# Patient Record
Sex: Female | Born: 1950 | Race: White | Hispanic: No | Smoking: Former smoker
Health system: Southern US, Community
[De-identification: ages and names within clinical notes are randomized; demographics above are authoritative.]

## PROBLEM LIST (undated history)

## (undated) DIAGNOSIS — E785 Hyperlipidemia, unspecified: Secondary | ICD-10-CM

## (undated) DIAGNOSIS — F32A Depression, unspecified: Secondary | ICD-10-CM

## (undated) DIAGNOSIS — B029 Zoster without complications: Secondary | ICD-10-CM

## (undated) DIAGNOSIS — G43909 Migraine, unspecified, not intractable, without status migrainosus: Secondary | ICD-10-CM

## (undated) DIAGNOSIS — R000 Tachycardia, unspecified: Secondary | ICD-10-CM

## (undated) DIAGNOSIS — R51 Headache: Secondary | ICD-10-CM

## (undated) DIAGNOSIS — J189 Pneumonia, unspecified organism: Secondary | ICD-10-CM

## (undated) DIAGNOSIS — M797 Fibromyalgia: Secondary | ICD-10-CM

## (undated) DIAGNOSIS — M1990 Unspecified osteoarthritis, unspecified site: Secondary | ICD-10-CM

## (undated) DIAGNOSIS — C801 Malignant (primary) neoplasm, unspecified: Secondary | ICD-10-CM

## (undated) DIAGNOSIS — K219 Gastro-esophageal reflux disease without esophagitis: Secondary | ICD-10-CM

## (undated) DIAGNOSIS — T7840XA Allergy, unspecified, initial encounter: Secondary | ICD-10-CM

## (undated) DIAGNOSIS — I10 Essential (primary) hypertension: Secondary | ICD-10-CM

## (undated) DIAGNOSIS — E079 Disorder of thyroid, unspecified: Secondary | ICD-10-CM

## (undated) DIAGNOSIS — F329 Major depressive disorder, single episode, unspecified: Secondary | ICD-10-CM

## (undated) DIAGNOSIS — G8929 Other chronic pain: Secondary | ICD-10-CM

## (undated) DIAGNOSIS — E669 Obesity, unspecified: Secondary | ICD-10-CM

## (undated) DIAGNOSIS — C4499 Other specified malignant neoplasm of skin, unspecified: Secondary | ICD-10-CM

## (undated) HISTORY — PX: ABDOMINAL EXPLORATION SURGERY: SHX538

## (undated) HISTORY — DX: Major depressive disorder, single episode, unspecified: F32.9

## (undated) HISTORY — DX: Allergy, unspecified, initial encounter: T78.40XA

## (undated) HISTORY — DX: Tachycardia, unspecified: R00.0

## (undated) HISTORY — DX: Essential (primary) hypertension: I10

## (undated) HISTORY — DX: Fibromyalgia: M79.7

## (undated) HISTORY — DX: Depression, unspecified: F32.A

## (undated) HISTORY — DX: Hyperlipidemia, unspecified: E78.5

## (undated) HISTORY — DX: Zoster without complications: B02.9

## (undated) HISTORY — PX: BUNIONECTOMY: SHX129

## (undated) HISTORY — PX: TONSILLECTOMY: SUR1361

## (undated) HISTORY — DX: Other specified malignant neoplasm of skin, unspecified: C44.99

## (undated) HISTORY — DX: Other chronic pain: G89.29

## (undated) HISTORY — DX: Migraine, unspecified, not intractable, without status migrainosus: G43.909

## (undated) HISTORY — DX: Headache: R51

## (undated) HISTORY — DX: Pneumonia, unspecified organism: J18.9

## (undated) HISTORY — DX: Malignant (primary) neoplasm, unspecified: C80.1

## (undated) HISTORY — DX: Gastro-esophageal reflux disease without esophagitis: K21.9

## (undated) HISTORY — DX: Unspecified osteoarthritis, unspecified site: M19.90

## (undated) HISTORY — DX: Disorder of thyroid, unspecified: E07.9

## (undated) HISTORY — DX: Obesity, unspecified: E66.9

---

## 1997-10-03 ENCOUNTER — Ambulatory Visit (HOSPITAL_COMMUNITY): Admission: RE | Admit: 1997-10-03 | Discharge: 1997-10-03 | Payer: Self-pay | Admitting: Neurology

## 1998-04-13 HISTORY — PX: CERVICAL LAMINECTOMY: SHX94

## 1998-05-06 ENCOUNTER — Other Ambulatory Visit: Admission: RE | Admit: 1998-05-06 | Discharge: 1998-05-06 | Payer: Self-pay | Admitting: *Deleted

## 1998-07-05 ENCOUNTER — Encounter: Payer: Self-pay | Admitting: Neurology

## 1998-07-05 ENCOUNTER — Ambulatory Visit (HOSPITAL_COMMUNITY): Admission: RE | Admit: 1998-07-05 | Discharge: 1998-07-05 | Payer: Self-pay | Admitting: Neurology

## 1999-05-09 ENCOUNTER — Other Ambulatory Visit: Admission: RE | Admit: 1999-05-09 | Discharge: 1999-05-09 | Payer: Self-pay | Admitting: *Deleted

## 1999-08-28 ENCOUNTER — Encounter: Payer: Self-pay | Admitting: Emergency Medicine

## 1999-08-28 ENCOUNTER — Emergency Department (HOSPITAL_COMMUNITY): Admission: EM | Admit: 1999-08-28 | Discharge: 1999-08-28 | Payer: Self-pay | Admitting: Emergency Medicine

## 1999-09-04 ENCOUNTER — Encounter: Payer: Self-pay | Admitting: Neurosurgery

## 1999-09-04 ENCOUNTER — Ambulatory Visit (HOSPITAL_COMMUNITY): Admission: RE | Admit: 1999-09-04 | Discharge: 1999-09-04 | Payer: Self-pay | Admitting: Neurosurgery

## 1999-10-08 ENCOUNTER — Encounter: Payer: Self-pay | Admitting: Neurosurgery

## 1999-10-10 ENCOUNTER — Inpatient Hospital Stay (HOSPITAL_COMMUNITY): Admission: RE | Admit: 1999-10-10 | Discharge: 1999-10-12 | Payer: Self-pay | Admitting: Neurosurgery

## 1999-10-10 ENCOUNTER — Encounter: Payer: Self-pay | Admitting: Neurosurgery

## 1999-10-20 ENCOUNTER — Emergency Department (HOSPITAL_COMMUNITY): Admission: EM | Admit: 1999-10-20 | Discharge: 1999-10-20 | Payer: Self-pay | Admitting: Emergency Medicine

## 1999-11-04 ENCOUNTER — Encounter: Payer: Self-pay | Admitting: Neurosurgery

## 1999-11-04 ENCOUNTER — Encounter: Admission: RE | Admit: 1999-11-04 | Discharge: 1999-11-04 | Payer: Self-pay | Admitting: Neurosurgery

## 2001-01-10 ENCOUNTER — Ambulatory Visit (HOSPITAL_COMMUNITY): Admission: RE | Admit: 2001-01-10 | Discharge: 2001-01-10 | Payer: Self-pay | Admitting: Internal Medicine

## 2001-01-10 ENCOUNTER — Encounter: Payer: Self-pay | Admitting: Internal Medicine

## 2001-11-14 ENCOUNTER — Other Ambulatory Visit: Admission: RE | Admit: 2001-11-14 | Discharge: 2001-11-14 | Payer: Self-pay | Admitting: *Deleted

## 2002-10-24 ENCOUNTER — Other Ambulatory Visit: Admission: RE | Admit: 2002-10-24 | Discharge: 2002-10-24 | Payer: Self-pay | Admitting: *Deleted

## 2002-12-05 ENCOUNTER — Encounter: Payer: Self-pay | Admitting: *Deleted

## 2002-12-05 ENCOUNTER — Encounter: Admission: RE | Admit: 2002-12-05 | Discharge: 2002-12-05 | Payer: Self-pay | Admitting: *Deleted

## 2003-08-08 ENCOUNTER — Ambulatory Visit (HOSPITAL_COMMUNITY): Admission: RE | Admit: 2003-08-08 | Discharge: 2003-08-08 | Payer: Self-pay | Admitting: Orthopedic Surgery

## 2003-11-26 ENCOUNTER — Ambulatory Visit (HOSPITAL_BASED_OUTPATIENT_CLINIC_OR_DEPARTMENT_OTHER): Admission: RE | Admit: 2003-11-26 | Discharge: 2003-11-26 | Payer: Self-pay | Admitting: Internal Medicine

## 2004-01-25 ENCOUNTER — Ambulatory Visit (HOSPITAL_COMMUNITY): Admission: RE | Admit: 2004-01-25 | Discharge: 2004-01-25 | Payer: Self-pay | Admitting: Family Medicine

## 2004-03-05 ENCOUNTER — Ambulatory Visit (HOSPITAL_COMMUNITY): Admission: RE | Admit: 2004-03-05 | Discharge: 2004-03-05 | Payer: Self-pay | Admitting: Neurosurgery

## 2004-05-06 ENCOUNTER — Ambulatory Visit: Payer: Self-pay | Admitting: Internal Medicine

## 2004-07-22 ENCOUNTER — Ambulatory Visit: Payer: Self-pay | Admitting: Internal Medicine

## 2004-09-22 ENCOUNTER — Ambulatory Visit: Payer: Self-pay | Admitting: Family Medicine

## 2004-11-20 ENCOUNTER — Ambulatory Visit: Payer: Self-pay | Admitting: Internal Medicine

## 2004-12-19 ENCOUNTER — Ambulatory Visit: Payer: Self-pay | Admitting: Internal Medicine

## 2005-02-23 ENCOUNTER — Ambulatory Visit: Payer: Self-pay | Admitting: Internal Medicine

## 2006-06-29 ENCOUNTER — Encounter: Admission: RE | Admit: 2006-06-29 | Discharge: 2006-06-29 | Payer: Self-pay | Admitting: Family Medicine

## 2006-09-22 ENCOUNTER — Ambulatory Visit: Payer: Self-pay | Admitting: Gastroenterology

## 2006-09-28 ENCOUNTER — Encounter: Payer: Self-pay | Admitting: Gastroenterology

## 2006-09-28 ENCOUNTER — Ambulatory Visit: Payer: Self-pay | Admitting: Gastroenterology

## 2006-09-30 ENCOUNTER — Encounter: Admission: RE | Admit: 2006-09-30 | Discharge: 2006-09-30 | Payer: Self-pay | Admitting: Family Medicine

## 2007-01-14 DIAGNOSIS — R519 Headache, unspecified: Secondary | ICD-10-CM | POA: Insufficient documentation

## 2007-01-14 DIAGNOSIS — K219 Gastro-esophageal reflux disease without esophagitis: Secondary | ICD-10-CM | POA: Insufficient documentation

## 2007-01-14 DIAGNOSIS — R51 Headache: Secondary | ICD-10-CM | POA: Insufficient documentation

## 2007-01-14 DIAGNOSIS — I10 Essential (primary) hypertension: Secondary | ICD-10-CM | POA: Insufficient documentation

## 2007-01-14 DIAGNOSIS — E119 Type 2 diabetes mellitus without complications: Secondary | ICD-10-CM | POA: Insufficient documentation

## 2007-02-15 ENCOUNTER — Inpatient Hospital Stay (HOSPITAL_COMMUNITY): Admission: EM | Admit: 2007-02-15 | Discharge: 2007-02-18 | Payer: Self-pay | Admitting: Emergency Medicine

## 2007-02-17 ENCOUNTER — Ambulatory Visit: Payer: Self-pay | Admitting: Infectious Diseases

## 2007-04-14 HISTORY — PX: ROUX-EN-Y PROCEDURE: SUR1287

## 2007-04-27 ENCOUNTER — Ambulatory Visit: Payer: Self-pay | Admitting: Cardiology

## 2007-05-04 ENCOUNTER — Encounter: Payer: Self-pay | Admitting: Cardiology

## 2007-05-04 ENCOUNTER — Ambulatory Visit: Payer: Self-pay

## 2007-08-11 ENCOUNTER — Encounter: Admission: RE | Admit: 2007-08-11 | Discharge: 2007-08-11 | Payer: Self-pay | Admitting: Family Medicine

## 2007-08-27 DIAGNOSIS — E669 Obesity, unspecified: Secondary | ICD-10-CM | POA: Insufficient documentation

## 2007-08-27 DIAGNOSIS — D126 Benign neoplasm of colon, unspecified: Secondary | ICD-10-CM | POA: Insufficient documentation

## 2007-08-27 DIAGNOSIS — M129 Arthropathy, unspecified: Secondary | ICD-10-CM | POA: Insufficient documentation

## 2007-08-27 DIAGNOSIS — E039 Hypothyroidism, unspecified: Secondary | ICD-10-CM | POA: Insufficient documentation

## 2007-08-27 DIAGNOSIS — K2960 Other gastritis without bleeding: Secondary | ICD-10-CM | POA: Insufficient documentation

## 2007-10-30 ENCOUNTER — Emergency Department (HOSPITAL_BASED_OUTPATIENT_CLINIC_OR_DEPARTMENT_OTHER): Admission: EM | Admit: 2007-10-30 | Discharge: 2007-10-30 | Payer: Self-pay | Admitting: Emergency Medicine

## 2008-03-13 ENCOUNTER — Encounter: Payer: Self-pay | Admitting: Cardiology

## 2008-04-23 ENCOUNTER — Encounter: Admission: RE | Admit: 2008-04-23 | Discharge: 2008-04-23 | Payer: Self-pay | Admitting: Family Medicine

## 2008-07-12 ENCOUNTER — Encounter: Admission: RE | Admit: 2008-07-12 | Discharge: 2008-07-12 | Payer: Self-pay | Admitting: Neurosurgery

## 2009-09-24 ENCOUNTER — Encounter: Admission: RE | Admit: 2009-09-24 | Discharge: 2009-09-24 | Payer: Self-pay | Admitting: Family Medicine

## 2010-01-04 ENCOUNTER — Encounter: Admission: RE | Admit: 2010-01-04 | Discharge: 2010-01-04 | Payer: Self-pay | Admitting: Orthopedic Surgery

## 2010-01-11 HISTORY — PX: ROTATOR CUFF REPAIR: SHX139

## 2010-06-10 ENCOUNTER — Other Ambulatory Visit: Payer: Self-pay | Admitting: Orthopedic Surgery

## 2010-06-10 DIAGNOSIS — M25519 Pain in unspecified shoulder: Secondary | ICD-10-CM

## 2010-06-12 ENCOUNTER — Ambulatory Visit
Admission: RE | Admit: 2010-06-12 | Discharge: 2010-06-12 | Disposition: A | Discharge status: Home / Self Care | Payer: BC Managed Care – PPO | Source: Ambulatory Visit | Attending: Orthopedic Surgery | Admitting: Orthopedic Surgery

## 2010-06-12 DIAGNOSIS — M25519 Pain in unspecified shoulder: Secondary | ICD-10-CM

## 2010-08-26 NOTE — Assessment & Plan Note (Signed)
[facility]                         GASTROENTEROLOGY OFFICE NOTE   Rhonda Farley, Rhonda Farley                      MRN:          [id number]  DATE:09/22/2006                            DOB:          11/08/1950    REASON FOR REFERAL:  Family history of colon cancer and recurrent nausea  and vomiting.   REFERRING MD:  Ursula Beath, MD   HISTORY OF PRESENT ILLNESS:  Rhonda Farley is a 60 year old white female  that I have previously evaluated for GERD and gastritis. She underwent  upper endoscopy in February of 2004. Mild erosive  antral gastritis was  noted. RUT was negative. Mild distal esophageal erythema was also noted.  She has been maintained on Nexium b.i.d. since that time. In addition,  she underwent colonoscopy in April of 1998 for chronic right lower  quadrant pain and intermittent diarrhea. The colonoscopy was normal to  the terminal ileum. Her twin sister has Crohn's  disease. Rhonda Farley  has noted about an 8 month history of intermittent nausea and vomiting  that is sudden and unexpected. It has not been associated with  refractory reflux symptoms, regurgitation, dysphagia, odynophagia, or  any change in bowel function. She has chronic right lower quadrant pain  that has been mild and present for over 15 years with no clear cause.  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 A biopsy was taken and she returns for a deep and wider  resection to Dr. Graylon Gunning next week. 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 Her mother has a  history of colon cancer that developed at about age 78 and she has a  maternal aunt with esophageal cancer. The patient has no complaints of  diarrhea, constipation, change in stool caliber, melena, hematochezia,  or weight loss.   PAST MEDICAL HISTORY:  Hypertension, diabetes mellitus, hyperlipidemia,  hypothyroidism,  arthritis, headaches, GERD, status post bunionectomy,  status post tonsillectomy, seasonal allergies, history of shingles,  history of pneumonia, restless leg syndrome, fibromyalgia, bilateral  knee arthritis, obesity, status post abdominal laparoscopy, status post,  cervical laminectomy.   CURRENT MEDICATIONS:  Listed on the chart.   MEDICATION ALLERGIES:  IV CONTRAST DYE LEADING TO A RASH.   Social history and review of systems per the handwritten form.   PHYSICAL EXAMINATION:  Obese, anxious white female. Height 5 feet 4 and  a half inches, blood pressure is 120/86, pulse 100 and regular.  HEENT EXAM: Anicteric sclera, oropharynx clear.  NECK: Without adenopathy appreciated.  CHEST: Clear to auscultation bilaterally.  CARDIAC: Regular rate and rhythm without murmurs.  ABDOMEN: Soft, mild right lower quadrant tenderness to deep palpation.  No rebound or guarding. No palpable organomegaly, masses, or hernias.  Normoactive bowel sounds.  RECTAL EXAM: Deferred to time of colonoscopy.  SKIN: A focused skin exam of her right upper extremity shows the recent  biopsy site.  EXTREMITIES: Without clubbing, cyanosis, or edema.  NEUROLOGIC: Alert and oriented x3. Grossly nonfocal.   ASSESSMENT/PLAN:  1. Recurrent nausea and vomiting. Etiology unclear.  Possible reflux      and regurgitation. Rule out medication side effects and other      causes. Re-intensify all antireflux measures and continue Nexium at      40 mg p.o. b.i.d. taken 30 to 45 minutes before breakfast and      dinner. Risks, benefits, and alternatives to upper endoscopy with      possible biopsy discussed with the patient. She consents to      proceed. This will be scheduled electively.  2. Rule out Muir-Torre syndrome. This is a subtype of nonpolyposis      colorectal cancer syndrome and is associated with sebaceous      adenocarcinomas. It is also associated with other malignancies      including, breast, uterine, and  renal tumors. Her primary      physician, Dr. Raquel James plans to schedule a CT scan of the abdomen      and pelvis. Risk, benefits, and alternatives to colonoscopy with      possible biopsy, and possible polypectomy discussed with the      patient. She consents to proceed. This will be scheduled at the      time of her colonoscopy.     Rhonda Farley. Russella Dar, MD, [facility]  Electronically Signed    MTS/MedQ  DD: 09/22/2006  DT: 09/22/2006  Job #: [id number]   cc:   Ursula Beath, MD

## 2010-08-26 NOTE — Letter (Signed)
April 27, 2007    Dr. Adele Dan  [facility] at [facility]  [id number]  [place], [place] [address]   RE:  Rhonda Farley, Rhonda Farley  MRN:  [id number]  /  DOB:  [birth year]   Dear Dr. Reinaldo Raddle:   Thank you for your referral of Rhonda Farley for preoperative evaluation.  As you know she is a pleasant 60 year old woman with a history of  obesity, hypertension on medical therapy, and type 2 diabetes mellitus  treated medically over the last year.  Additional problems are outlined  below.  She is being considered for bariatric surgery which is actually  already scheduled on the 28th of this month.  She reports no prior major  cardiac conditions, although has not undergone any previous ischemic  evaluation.  She does state that she took phen-fen for approximately one  month several years ago, but has no known history of valvular heart  disease.  Symptomatically she denies any exertional chest pain,  palpitations or syncope.  She has no dyspnea on exertion beyond NYHA  class I to II.  She is not exercising regularly at this time stating  that she just started back walking approximately one half mile at a  time.  Back in September of last year she was apparently exercising  regularly with a trainer and on a diet at that point.  She reports that  her weight has been 230 pounds for at least the last two years, and that  she has had a very difficult time losing any significant degree of  weight.  Her electrocardiogram today in clinic is normal showing sinus  rhythm at 95 beats per minute.   ALLERGIES:  The patient does report rash with IVP DYE and also has an  allergy to WATERMELON.  No drug allergies are listed.   MEDICATIONS:  She is presently taking Januvia 100 mg p.o. daily,  glipizide 10 mg p.o. b.i.d. Cymbalta 60 mg p.o. daily, Nexium 40 mg p.o.  b.i.d., levothyroxine 0.25 mg p.o. daily, ropinirole 1 mg p.o. q.h.s.,  Valtrex 500 mg p.o. daily, Cozaar  50 mg p.o. daily, Zyrtec 10 mg p.o.  daily, multivitamin daily, B complex vitamins, calcium supplements,  Fiorinal caplets, propoxyphene, zolpidem 5 mg p.o. p.r.n., Mucinex  p.r.n., Lyrica 75 mg p.o. t.i.d., Amerge 0.5 mg p.o. p.r.n., naproxen  p.r.n., and Motrin p.r.n.   PAST MEDICAL HISTORY:  Past medical history is as outlined above.  The  patient reports additional problems including bilateral knee arthritis,  fibromyalgia, restless leg syndrome, bronchiolitis obliterans with  organizing pneumonia diagnosed in 2003, tendinitis, gastroesophageal  reflux disease, goiter, seasonal allergies, herpes zoster, and migraine  headaches.  She has undergone a tonsillectomy, bunionectomy, and  cervical laminectomy.   FAMILY HISTORY:  The patient reports no premature cardiovascular  disease.  Her father died at age 39 with non-small cell lung cancer, and  her mother had colon cancer in her 2's.   Additional Family History was reviewed and is noncontributory at this  point.   REVIEW OF SYSTEMS:  Is as described above.  She does have some shortness  of breath with seasonal allergies but no active wheezing on any regular  basis and no need for metered-dose inhaler therapy on any regular basis.  She has history of polycystic ovary disease and some menstrual  dysfunction.  No orthopnea, PND.  Mild lower extremity edema that is  dependent.  Otherwise, systems are negative.   Ms.  Farley is married.  She has one child.  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  She does have a prior tobacco use history, but  quit in 1985.  No active alcohol use.   PHYSICAL EXAMINATION:  VITAL SIGNS:  The patient weighs 230 pounds,  blood pressure 134/91, heart rate 90.  GENERAL:  An obese woman in no acute distress.  HEENT:  Conjunctivae, lids normal.  Oropharynx clear.  NECK:  Supple.  No elevated jugular venous pressure, no bruits or  thyromegaly noted.  LUNGS:  Generally clear.  Nonlabored breathing.   No wheezing noted.  CARDIAC:  Somewhat diminished heart sounds but normal S1 and S2.  No  loud systolic murmur.  No S3 gallop.  No pericardial rub.  ABDOMEN:  Soft, nontender, normoactive bowels sounds.  No bruits.  EXTREMITIES:  Exhibit trace edema around the ankles.  Otherwise,  nonpitting.  Distal pulses 2+.  SKIN:  Warm and dry.  MUSCULOSKELETAL:  No kyphosis is noted.  NEUROPSYCHIATRIC:  The patient is alert and oriented x3.  Affect is  normal.   IMPRESSION/RECOMMENDATIONS:  Rhonda Farley is a 60 year old woman with  history of obesity, hypertension, and type 2 diabetes mellitus.  She is  being considered for bariatric surgery on the 28th of this month at [facility].  Her resting electrocardiogram is normal.  She does have a  history of phen-fen use for one month period of time several years ago.  Preoperative assessment is requested.  Our plan will be a 2-D  echocardiogram to assess  cardiac structure and valvular function and also an adenosine Myoview  for basic assessment of ischemia.  If the studies are low risk I would  anticipate that she should be able to proceed on with her planned  surgery.  Will plan to inform the patient of the test results and also  forward these on to you.    Sincerely,      Jonelle Sidle, MD  Electronically Signed    SGM/MedQ  DD: 04/27/2007  DT: 04/27/2007  Job #: [id number]

## 2010-08-26 NOTE — Discharge Summary (Signed)
Farley, Rhonda               ACCOUNT NO.:  [id number]   MEDICAL RECORD NO.:  [id number]          PATIENT TYPE:  INP   LOCATION:  4728                         FACILITY:  [facility]   PHYSICIAN:  Lucita Ferrara, MD         DATE OF BIRTH:  07-Feb-1951   DATE OF ADMISSION:  02/15/2007  DATE OF DISCHARGE:  02/18/2007                               DISCHARGE SUMMARY   ADMISSION DIAGNOSIS:  1. Headaches with altered mental status.  2. Confusion.  3. Migraine headache.  4. Rule out meningeal encephalitis.  5. Diabetes type 2.  6. Hypertension.  7. Hyperlipidemia.  8. Fibromyalgia with restless legs syndrome.  9. History of ocular HSV.  10.History of Tourette syndrome.   DISCHARGE DIAGNOSES:  1. Headaches with altered mental status- resolved.  2. Confusion- resolved.  3. Migraine headache, stable, chronic and resolved.  4. Rule out meningeal encephalitis. Ruled-out.  5. Diabetes type 2, stable and chronic.  6. Hypertension.  7. Hyperlipidemia.  8. Fibromyalgia with restless legs syndrome.  9. History of ocular HSV.  10.History of Tourette syndrome.  11.Encephalitis, ruled out.  12.Meningitis, ruled out.   DISCHARGE CONDITION:  Stable.   MENTAL STATUS:  Stable.  The patient able to ambulate.  Good p.o.  intake, without nausea or vomiting.  Ambulation without dizziness or  falls.   CONSULTATIONS:  Dr. Pearlean Brownie from neurology was consulted.   PROCEDURES:  Patient had a lumbar puncture which showed negative results  for bacteria.  Patient also had  an MRI of the brain which was negative.  This was done on February 16, 2007, which was negative.  MRA of the brain  and angiography were also negative.  CT scan February 15, 2007 was also  negative.  It only showed acute-on-chronic postnasal sinusitis.  Patient  also had a fluoroscopic lumbar puncture which was an unremarkable  procedure, but the results were as follows:  No organisms seen.  Final  February 16, 2007 the cell count and  differential are as follows:  Color  was colorless.  Clarity was clear.  White count 2, within normal limits,  white blood cell count 1 (given traumatic puncture this was okay),  lymphocytes few, which was negative.  Monocytes occasional, which was  also negative.  Patient also had a cardiac panel within normal limits  and negative.  Thyroid function test also was within normal limits.  Hemoglobin A1C showed an uncontrolled 10.1.  CBC:  White count 11.1,  hemoglobin 14.3, hematocrit 42.1, and platelets 242.   BRIEF HISTORY AND PHYSICAL EXAMINATION:  The patient is a 60 year old  Caucasian female who presented to the hospital with confusion and  headaches and disorientation for several days.  Sleepiness as well.  Patient apparently was actually hallucinating, seeing animals and  insects which were not there.  Patient had been also confused on and off  at that time.  For the past three days patient had been suffering from  headaches and migraines.  Most of the history at that time was provided  by the husband.  The patient also had auditory hallucinations.  Patient  reported having double vision at that time.   HOSPITAL COURSE:  Pretty unremarkable, with, as above, the general tests  including CT, MRI of the brain, and lumbar puncture being negative,  patient's headache was well controlled with pain medications.  All  medications were reinstituted.  Home medications,  including Cymbalta,  glipizide, insulin, NovoLog, levothyroxine, Claritin, Cozaar.  For  chronic medical problems, patient's headaches, and generalized symptoms  resolved.   DISCHARGE PHYSICAL EXAMINATION:  VITAL SIGNS:  Temperature 98.  Pulse  85.  Respirations 18.  Blood pressure 111/70.  Pulse ox 94% on room air.  HEENT:  Normocephalic, atraumatic.  NECK:  Supple.  No JVD.  No carotid bruits.  CARDIOVASCULAR:  S1, S2.  Regular rate and rhythm.  No murmurs, rubs or  clicks.  ABDOMEN:  The abdomen is soft and nontender  and nondistended.  Positive  bowel sounds.  LUNGS:  Clear to auscultation bilaterally.  No rhonchi or rales or  wheezes.  NEURO:  Alert and oriented x 2.  Cranial nerves II-XII are grossly  intact.  Cerebellar intact grossly.  Strength is 5/5, bilateral upper  and lower extremities.  Reflexes are 2+ bilaterally, upper and lower  extremities.  Reflexes 2+ bilaterally, upper and lower extremities.  MENTAL STATUS:  The patient is alert and oriented x3.  Denies any visual  or auditory hallucinations.   ACTIVITY:  The patient is able to ambulate without difficulty, and not  dizzy.  Able to walk without falls.  Able to tolerate p.o. intake.   DIET:  The patient to have a regular diet as before.   MEDICATIONS:  To be taken at home are as follows:  1. Patient is recommended to take Depakote per Neurology      recommendations.  2. ER 500 mg daily for two weeks.  3. Patient resume Cymbalta 60 mg p.o. every day.  4. Patient to resume glipizide 10 mg p.o. every day.  5. Patient to resume levothyroxine 125 mcg p.o. every day.  6. Patient to resume Claritin 10 mg p.o. daily.  7. Cozaar 50 mg p.o. every day.  8. Protonix pump inhibitor 40 mg p.o. every day.  9. Requip for restless legs syndrome 1 mg p.o. nightly.  10.Zocor 40 mg p.o. daily.  11.Januvia 50 mg p.o. every day.   FOLLOWUP:  1. Patient recommended to follow up with Neurology for chronic      migraine headaches in two weeks.  2. Follow up with primary care doctor in one to two weeks as well.  3. Patient is discharged from the floor hemodynamically stable.  4. Patient advised to call with severe headaches, migraines, chest      pain, shortness of breath, or recurrent hallucinations.      Lucita Ferrara, MD  Electronically Signed     RR/MEDQ  D:  02/18/2007  T:  02/18/2007  Job:  [id number]   cc:   Ursula Beath, MD

## 2010-08-26 NOTE — H&P (Signed)
NAMETREASURE, [facility] NO.:  [id number]   MEDICAL RECORD NO.:  [id number]          PATIENT TYPE:  INP   LOCATION:  4728                         FACILITY:  [facility]   PHYSICIAN:  Della Goo, M.D. DATE OF BIRTH:  [birth year]   DATE OF ADMISSION:  02/15/2007  DATE OF DISCHARGE:                              HISTORY & PHYSICAL   PRIMARY CARE PHYSICIAN:  Dr. Ursula Beath, [facility].   CHIEF COMPLAINT:  Hallucinations.   HISTORY OF PRESENT ILLNESS:  This is a 60 year old female who was  brought to the emergency department by her husband secondary to  complaints of severe headache, confusion and decreased memory since the  a.m.  For the past 3 days the patient had been suffering a headache and  she does have a history of migraine headaches.  She reports the headache  was mild however did report that at times the headache had been a 10/10.  The patient is unable to give the recent history prior to admission and  this history is provided by her husband and her friend who are at  bedside.  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  He reports that she had confusion and was disoriented and her friend  also reports that she had decreased memory and had lost track of days.  The friend also states that the patient was making up words.  The  patient also was reported by both as having visual hallucinations and  report that in the a.m. the patient wrapped a towel around her bathroom  commode.  Her husband asked her why she had done this and she stated  that she saw a pig vomiting into the commode.  While the patient was in  the emergency department room as well, she describes seeing a toy  soldier on the wall which was not there.  The patient's husband and  friend also report that the patient has  had double vision.  They also  report that on arrival to the hospital the patient symptoms began to  improve.  The patient denies having any fevers, chills, cough, shortness  of breath.  She does report having upper respiratory infection symptoms  2 weeks ago.  The patient's husband reports checking her blood sugar and  finding her blood sugar to be 191.   PAST MEDICAL HISTORY:  Significant for type 2 diabetes mellitus,  fibromyalgia, hypertension, hypothyroidism, migraine headaches, restless  leg syndrome, Muir-Torret syndrome, and ocular herpes simplex.   PAST SURGICAL HISTORY:  History of a cervical spine fusion C3, C4, C5  and C-6.  Sebaceous cyst removal of the right upper arm and right  bunionectomy.   MEDICATIONS:  1. Levothyroxine 125 mcg one p.o. daily.  2. Glyburide 10 mg one p.o. daily.  3. Januvia 50 mg one p.o. daily.  4. Zocor 40 mg one p.o. daily.  5. Cymbalta 60 mg one p.o. daily.  6. Cozaar 50  mg one p.o. daily.  7. Valtrex 500 mg one p.o. daily.  8. Zyrtec 10 mg one p.o. daily.  9. Requip 1 mg p.o. nightly.  10.Darvocet N 100 2 tablets p.o. q.6 h p.r.n. severe pain.  11.Fiorinal p.r.n. migraine headaches.   SOCIAL HISTORY:  The patient is married, nonsmoker, nondrinker.   FAMILY HISTORY:  Positive for coronary artery disease and hypertension  in her paternal grandmother.  Positive diabetes mellitus in paternal  uncles.  Mother with skin cancer and father had prostate cancer.  Sister  with Crohn's disease and positive family history of colon and esophageal  cancers.   REVIEW OF SYSTEMS:  Pertinent's are mentioned above.   PHYSICAL EXAMINATION:  GENERAL:  This is a 60 year old obese female in  discomfort but no acute distress.  VITAL SIGNS:  Temperature 98.2, blood pressure 145/89, heart rate 106,  respirations 22, O2 saturations 97%.  HEENT: Examination normocephalic, atraumatic.  Pupils equally round  reactive to light.  Extraocular muscles are intact.   Funduscopic benign.  Oropharynx is clear.  NECK:  Neck is supple, full range of motion.  No thyromegaly,  adenopathy, jugular venous distention.  CARDIOVASCULAR:  Regular rate and rhythm.  No murmurs, gallops or rubs.  LUNGS:  Clear to auscultation bilaterally.  ABDOMEN:  Positive bowel sounds, soft, nontender, nondistended.  EXTREMITIES:  Without cyanosis, clubbing or edema.  NEUROLOGIC EXAMINATION:  The patient is alert and oriented x3.  Her  speech is clear.  She does have mild confusion and decreased  recollection of recent events.  Her cranial nerves are intact.  Motor  and sensory function are intact.  Her strength is 5/5 and she has full  range of motion throughout.  Babinski sign:  Toes are downgoing  bilaterally. Deep tendon reflexes are intact.   CT scan of the head performed reveals negative acute intracranial  hemorrhage or masses.   ASSESSMENT:  A 60 year old female being admitted with:  1. Altered mental status.  2. Type 2 diabetes mellitus.  3. Hypertension.  4. Hyperlipidemia.  5. Chronic bilateral hip pain.  6. Fibromyalgia and restless leg syndrome.  7. History of ocular HSV.  8. History of Muir-Torret syndrome.   PLAN:  The patient will be admitted to a neurology and telemetry area.  Neurologic checks will be performed.  An MRI and MRA study has been  ordered along with an EEG study.  A lumbar puncture has also been  ordered per interventional radiology.  The patient will be placed on  empiric antibiotic therapy of Rocephin 2 grams IV times one now then q.  day and then antiviral therapy with IV acyclovir.  The patient will  continue on her regular medications.  GI and DVT prophylaxis have also been ordered.  The patient will be on  bedrest for now.  IV fluids have been ordered for fluid resuscitation  and maintenance and the patient will be placed on sliding scale insulin  coverage and a modified carbohydrate diet.  Neurology has advised and  will be  consulted.      Della Goo, M.D.  Electronically Signed     HJ/MEDQ  D:  02/16/2007  T:  02/16/2007  Job:  [id number]   cc:   Ursula Beath, MD

## 2010-08-26 NOTE — Procedures (Signed)
CLINICAL HISTORY:  This is a 60 year old lady being evaluated for  seizures with history of confusion and hallucinations.   This is a routine EEG performed with the patient awake in drowsy state  using 17-channel machine and standard 10/20 electrode placement.   Background rhythm consists of 11 to 12 Hz alpha which is of moderate  amplitude, synchronous to eye opening  and closure.  Intermittent 6 to 7  Hz is seen bilaterally theta slowing is seen bilaterally in diffuse and  symmetric distribution.  No paroxysmal epileptiform activities, spikes  or sharp waves were seen.  Definite sleep stages are not seen, except  mild drowsiness which was uneventful.  Length of this EEG is 65.5  minutes.  Technical component is average.  EKG tracing reveals regular  sinus rhythm.  Hyperventilation and photic stimulation are both  unremarkable.   IMPRESSION:  This is an EEG performed during awake and drowsy state.  It  is abnormal due to presence of mild bi-hemispheric slowing which is a  nonspecific finding seen in relative condition.  No definite  epileptiform features were noted.           ______________________________  Sunny Schlein. Pearlean Brownie, MD     EAV:[id number]  D:  02/16/2007 17:33:18  T:  02/17/2007 10:13:29  Job #:  [id number]

## 2010-08-26 NOTE — Consult Note (Signed)
Rhonda Farley, Rhonda Farley               ACCOUNT NO.:  [id number]   MEDICAL RECORD NO.:  [id number]          PATIENT TYPE:  INP   LOCATION:  4728                         FACILITY:  [facility]   PHYSICIAN:  Pramod P. Pearlean Brownie, MD    DATE OF BIRTH:  [birth year]   DATE OF CONSULTATION:  DATE OF DISCHARGE:                                 CONSULTATION   REFERRING PHYSICIAN:  [facility]   REASON FOR REFERRAL:  Headache with confusion and altered mental status.   HISTORY OF PRESENT ILLNESS:  Rhonda Farley is a 60 year old Caucasian  lady who developed headache and nausea since Saturday five days ago.  The next couple of days symptoms gradually worsened.  On Monday, she was  found to be confused, disoriented as well as sleepy and slept off-and-on  for 30 hours.  She was also found to be hallucinating, seeing animals  and insects which were not there.  She was also confused off-and-on.  She does take Darvocet two tablets every night for pain, but does not  remember if she took extra dosages because of her headache.  She denies  any loss of consciousness, seizure, focal extremity weakness or  numbness.   PAST MEDICAL HISTORY:  Significant for:  1. Diabetes.  2. Hypertension.  3. Hypothyroidism.  4. Hyperlipidemia.  5. History of herpes simplex involving the lips in the past.   MEDICATION LIST:  Includes:  1. Levothyroxine.  2. Ambien.  3. Cozaar.  4. Januvia.  5. Zyrtec.  6. Prilosec.  7. Glipizide.   SOCIAL HISTORY:  Patient is married, lives in [place] with her  husband.  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  She does not smoke or drink.   REVIEW OF SYSTEMS:  Negative for any recent travel out of the country,  fever, cough, shortness of breath, diarrheal illness.   PHYSICAL EXAMINATION:  GENERAL:  Reveals an obese, middle-age Caucasian  lady who at present seems to be not in distress.  VITAL SIGNS:  She is afebrile at present, pulse rate is 78 per  minute  regular, blood pressure 145/89, respiratory rate 22 per minute.  HEAD:  Head is nontraumatic.  NECK:  Supple without bruit.  ENT EXAM: Unremarkable.  CARDIAC EXAM:  No murmur or gallop.  LUNGS:  Clear to auscultation.  NEUROLOGIC EXAM:  Patient is awake, alert; she is oriented to time,  place and person.  She follows commands quite well.  There are no  hallucinations.  Her recall is 2/3, animal naming is 12 which is good.  She follows three-step commands.  Eye movements are full ranged.  There  is no nystagmus.  Visual fields are full to confrontation testing.  Face  is symmetric.  Tongue is midline.  Motor system exam reveals no upper  extremity drift, symmetric stand, tone, reflexes, coordination,  sensation.  Gait was not tested.   DATA REVIEWED:  CT scan of the head is unremarkable.   MRI scan of the brain done today reveals no acute abnormality.  Two tiny  areas of nonspecific white  matter hyperdensities are noted in the  frontal regions which may be from her history of migraines.   MRA of the brain revealed no aneurysms or high-grade stenosis.   IMPRESSION:  A 60 year old lady with four-to-five-day history of  headache with altered mental status and confusion.  This might represent  prolonged migraine headache with medication-induced confusion, though  certainly meningoencephalitis  needs to be ruled out.   PLAN:  I agree with doing spinal tap.  If no evidence of inflammation is  found, consider stopping the acyclovir and antibiotics.  Trial of IV  Depacon 2 mg x1 now for her headache and if this is effective, start  Depakote ER 500 mg a day.  I would be happy to follow the patient in  consults.  Kindly call for question.           ______________________________  Sunny Schlein. Pearlean Brownie, MD     PPS/MEDQ  D:  02/16/2007  T:  02/16/2007  Job:  [id number]

## 2010-08-29 NOTE — Op Note (Signed)
[place]  Patient:    Rhonda Farley, Rhonda Farley                      MRN: [id number] Proc. Date: 10/10/99 Adm. 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  OPERATIVE PROCEDURE:  Anterior cervical diskectomy and fusion at C5-6 and C6 and C7.  SURGEON:  Payton Doughty, M.D.  ANESTHESIA:  General endotracheal.  PREPARATION:  Prepped with sterile Betadine, and prepped and scrubbed with alcohol wipe.  COMPLICATIONS:  None.  DESCRIPTION OF PROCEDURE:  The patient is a 60 year old woman with cervical spondylosis at C5-6 and C6 and C7.  He was taken to the operating room, smoothly anesthetized and intubated.  Placed supine on the operating table. Following shave, prep and drape in the usual sterile fashion, the skin was incised in the midline, the medial border of the sternocleidomastoid muscle on the left side, two fingerbreadths below the level of the carotid tubercle. The platysma was identified, elevated, divided, and undermined.  The sternocleidomastoid was identified.  Medial dissection revealed the carotid artery to retract laterally to the left, trachea and esophagus retract laterally to the right, exposing the bones to the anterior cervical spine.  A marker was placed and intraoperative x-ray obtained to confirm correctness of level.  Following this, diskectomy was carried out.  The longus colli muscle was taken down and then the shadow line retractor placed.  Diskectomy was carried out at C5 and C6, and C6 and C7 first under gross observation, and then using the operating microscope.  At 5-6, there was extensive spondylitic disease.  At C6 and C7, there is a herniate disk slightly lateral to the left, but also with extensive spurring.  Following resection of disk  and spur at both levels, the neuroforamen were carefully explored to make sure there was no encumbrance of the nerve roots as they exited.  8 mm bone grafts were fashioned for patellar allograft and tapped into place.  Following this, a 44 mm A-line plate was placed with two screws in C5, one in C6, and two in C7, and the screws were 12 mm.  An intraoperative x-ray showed good placement of bone grafts, plate, and screws. Locking screws were placed.  The wound was irrigated and hemostasis assured. The platysma was reapproximated with 3-0 Vicryl in an interrupted fashion. The subcutaneous tissue was reapproximated with 3-0 Vicryl in an interrupted fashion.  Subcuticular tissue was reapproximated with 3-0 Vicryl in an interrupted fashion.  The skin was closed with a 4-0 Vicryl in a running subcuticular fashion.  Benzoin and Steri-Strips were placed and made occlusive with Telfa and Op-Site.  The patient was then placed in an Aspen collar and returned to the recovery room in good condition. DD:  10/10/99 TD:  10/11/99 Job: [id number]

## 2010-08-29 NOTE — Procedures (Signed)
NAME:  Rhonda Farley, Rhonda Farley             ACCOUNT NO.:  [id number]   MEDICAL RECORD NO.:  [id number]          PATIENT TYPE:  OUT   LOCATION:  SLEEP CENTER                 [facility]ACILITY[facility]  [facility]   PHYSICIAN:  Marcelyn Bruins, M.D. [facility] DATE OF BIRTH:  1951-03-31   DATE OF ADMISSION:  11/26/2003  DATE OF DISCHARGE:  11/26/2003                              NOCTURNAL POLYSOMNOGRAM   REFERRING PHYSICIAN:  Stacie Glaze, M.D.   INDICATION FOR THE STUDY:  Hypersomnia with sleep apnea.   EPWORTH SLEEPINESS SCORE:  14   SLEEP ARCHITECTURE:  The patient had a total sleep time of 237 minutes with  significantly decreased slow wave sleep and REM was never achieved.  Sleep  onset latency was prolonged at 80 minutes.   IMPRESSION:  1.  Mild obstructive sleep apnea/hypopnea syndrome with a respiratory      disturbance index of 16 events/hr and desaturation as low as 89%.  The      patient's obstructive events occurred primarily in the supine position.      The patient did not meet split-night criteria due to most of her events      occurring in the last half of the night.  2.  Moderate to loud snoring noted.  3.  No clinically significant cardiac arrhythmia.  4.  Very large numbers of leg jerks with significant sleep disruption.  I      would strongly suggest clinical correlation and treatment if the patient      is indeed symptomatic.                                   ______________________________                                Marcelyn Bruins, M.D. [facility]     KC/MEDQ  D:  12/14/2003 10:20:07  T:  12/15/2003 12:50:45  Job:  [id number]

## 2010-08-29 NOTE — Discharge Summary (Signed)
[place]  Patient:    Rhonda Farley, Rhonda Farley                      MRN: [id number] Adm. Date:  [id number] Disc. 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  PROCEDURES:  Anterior cervical diskectomy C5-6, C6-7, with anterior plating using A-line plate.  COMPLICATIONS:  None.  CONDITION ON DISCHARGE:  Alive and well.  HISTORY OF PRESENT ILLNESS:  Aneya Daddona has been followed by Dr. Channing Mutters for cervical spondylosis.  On the last visit she demonstrated hyperreflexic and a Hoffmann sign.  This, along with her spondylitic disease and cervical stenosis at 5-6 and 6-7, led him to recommend an anterior cervical diskectomy for decompression and anterior plating for fusion.  HOSPITAL COURSE:  She was admitted on October 10, 1999, and had an uncomplicated procedure.  Postoperatively, she had some mild difficulty with swallowing, but that cleared at the time of discharge.  Her wound is clean and dry, without signs of infection.  She has 5/5 strength in the upper and lower extremities postoperatively.  Voice is normal and strong.  She has voided, tolerated a regular diet, and ambulated with difficulty.  She will be discharged home with Darvocet-N 100 for pain and Valium for muscle spasms.  She will have a return appointment to see Dr. Channing Mutters in approximately two to three weeks.  She will be given an instruction sheet as to wound care.  She was told to drive nor do any heavy lifting. DD:  10/12/99 TD:  10/12/99 Job: [id number]

## 2010-09-01 ENCOUNTER — Encounter: Payer: Self-pay | Admitting: Internal Medicine

## 2010-09-01 ENCOUNTER — Ambulatory Visit (INDEPENDENT_AMBULATORY_CARE_PROVIDER_SITE_OTHER): Payer: BC Managed Care – PPO | Admitting: Internal Medicine

## 2010-09-01 DIAGNOSIS — E538 Deficiency of other specified B group vitamins: Secondary | ICD-10-CM

## 2010-09-01 DIAGNOSIS — E119 Type 2 diabetes mellitus without complications: Secondary | ICD-10-CM

## 2010-09-01 DIAGNOSIS — M549 Dorsalgia, unspecified: Secondary | ICD-10-CM

## 2010-09-01 DIAGNOSIS — E785 Hyperlipidemia, unspecified: Secondary | ICD-10-CM

## 2010-09-01 DIAGNOSIS — Z1211 Encounter for screening for malignant neoplasm of colon: Secondary | ICD-10-CM

## 2010-09-01 DIAGNOSIS — D126 Benign neoplasm of colon, unspecified: Secondary | ICD-10-CM

## 2010-09-01 DIAGNOSIS — E039 Hypothyroidism, unspecified: Secondary | ICD-10-CM

## 2010-09-01 DIAGNOSIS — E669 Obesity, unspecified: Secondary | ICD-10-CM

## 2010-09-01 DIAGNOSIS — G8929 Other chronic pain: Secondary | ICD-10-CM

## 2010-09-01 LAB — MICROALBUMIN / CREATININE URINE RATIO
Creatinine,U: 160.5 mg/dL
Microalb Creat Ratio: 3.8 mg/g (ref 0.0–30.0)
Microalb, Ur: 6.1 mg/dL — ABNORMAL HIGH (ref 0.0–1.9)

## 2010-09-01 LAB — LIPID PANEL
Cholesterol: 146 mg/dL (ref 0–200)
HDL: 73.2 mg/dL (ref >39.00)
LDL Cholesterol: 54 mg/dL (ref 0–99)
Triglycerides: 92 mg/dL (ref 0.0–149.0)
VLDL: 18.4 mg/dL (ref 0.0–40.0)

## 2010-09-01 LAB — BASIC METABOLIC PANEL
CO2: 29 mEq/L (ref 19–32)
Calcium: 9.2 mg/dL (ref 8.4–10.5)
GFR: 120.75 mL/min (ref >60.00)
Sodium: 141 mEq/L (ref 135–145)

## 2010-09-01 MED ORDER — LEVOTHYROXINE SODIUM 100 MCG PO TABS: 100.0000 ug | ORAL_TABLET | Freq: Every day | ORAL | Status: DC

## 2010-09-01 MED ORDER — ESOMEPRAZOLE MAGNESIUM 40 MG PO CPDR: 40.0000 mg | DELAYED_RELEASE_CAPSULE | Freq: Two times a day (BID) | ORAL | Status: DC

## 2010-09-01 MED ORDER — GABAPENTIN 300 MG PO CAPS: 300.0000 mg | ORAL_CAPSULE | Freq: Three times a day (TID) | ORAL | Status: DC

## 2010-09-01 MED ORDER — BUDESONIDE-FORMOTEROL FUMARATE 80-4.5 MCG/ACT IN AERO: 2.0000 | INHALATION_SPRAY | RESPIRATORY_TRACT | Status: DC | PRN

## 2010-09-01 MED ORDER — DULOXETINE HCL 60 MG PO CPEP: 60.0000 mg | ORAL_CAPSULE | Freq: Every day | ORAL | Status: DC

## 2010-09-01 MED ORDER — SIMVASTATIN 40 MG PO TABS: 40.0000 mg | ORAL_TABLET | Freq: Every day | ORAL | Status: DC

## 2010-09-01 MED ORDER — BUPROPION HCL ER (XL) 150 MG PO TB24: 150.0000 mg | ORAL_TABLET | ORAL | Status: DC

## 2010-09-01 MED ORDER — BUTALBITAL-APAP-CAFFEINE 50-325-40 MG PO TABS: 2.0000 | ORAL_TABLET | ORAL | Status: DC | PRN

## 2010-09-01 MED ORDER — CELECOXIB 200 MG PO CAPS: 200.0000 mg | ORAL_CAPSULE | Freq: Every day | ORAL | Status: DC

## 2010-09-01 MED ORDER — CYANOCOBALAMIN 1000 MCG/ML IJ SOLN: 1000.0000 ug | INTRAMUSCULAR | Status: DC

## 2010-09-01 MED ORDER — VALACYCLOVIR HCL 500 MG PO TABS: 500.0000 mg | ORAL_TABLET | Freq: Two times a day (BID) | ORAL | Status: DC

## 2010-09-01 MED ORDER — LORAZEPAM 1 MG PO TABS: 1.0000 mg | ORAL_TABLET | Freq: Every day | ORAL | Status: DC

## 2010-09-01 MED ORDER — NARATRIPTAN HCL 2.5 MG PO TABS: 2.5000 mg | ORAL_TABLET | ORAL | Status: DC | PRN

## 2010-09-04 ENCOUNTER — Other Ambulatory Visit: Payer: Self-pay

## 2010-09-04 MED ORDER — LORAZEPAM 1 MG PO TABS: 1.0000 mg | ORAL_TABLET | Freq: Every day | ORAL | Status: DC

## 2010-09-05 ENCOUNTER — Encounter: Payer: Self-pay | Admitting: Gastroenterology

## 2010-09-08 ENCOUNTER — Encounter: Payer: Self-pay | Admitting: Internal Medicine

## 2010-09-08 DIAGNOSIS — M549 Dorsalgia, unspecified: Secondary | ICD-10-CM | POA: Insufficient documentation

## 2010-09-08 DIAGNOSIS — E1169 Type 2 diabetes mellitus with other specified complication: Secondary | ICD-10-CM | POA: Insufficient documentation

## 2010-09-08 DIAGNOSIS — E785 Hyperlipidemia, unspecified: Secondary | ICD-10-CM | POA: Insufficient documentation

## 2010-09-08 NOTE — Assessment & Plan Note (Signed)
Obtain fasting profile.

## 2010-09-08 NOTE — Assessment & Plan Note (Signed)
Schedule GI follow up

## 2010-09-08 NOTE — Assessment & Plan Note (Signed)
suboptimal control. Complicated by dietary indiscretion. Stop nighttime snacks. Pursue diabetic diet exercise and weight loss. Change glipizide 10 mg in the morning 7.5 mg in the evening. Monitor blood sugars and report results for review. Obtain A1c chem 7 and urine microalbumin.

## 2010-09-08 NOTE — Progress Notes (Signed)
  Subjective:    Patient ID: [name], female    DOB: 1951-03-03, 60 y.o.   MRN: [id number]  HPI presents to clinic to establish primary medical care and for followup of multiple medical problems. Has no history of diabetes mellitus with blood sugars ranging between 70 and 200. Has hypoglycemic symptoms approximately 2 in the afternoon with blood sugar around 70. Admits to eating snacks late at night and her morning blood sugars are variable.diabetic eye exam up to date and reportedly normal. Obtains these on a yearly basis.    Has history of right rotator cuff pain status post physical therapy and injections x2. Has an abnormal appearance to her humerus noted on CT followed by [facility]. Complains of chronic low back pain that has been long-standing. Has no neurologic deficit including paresthesias or weakness. Apparently has seen a specialist in [place] and requests referral for evaluation.has apparently undergone cervical discectomy and fusion in the past and is stable.  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  Is at risk for carcinomas in general.   Mammogram not due until June 14 or later. Colonoscopy felt to be approximately 2008 and she wishes followup with GI. Bone density reportedly normal June 2011. States will obtain Pap smears through female provider.  No other complaints  Reviewed past medical history, past surgical history, medications, allergies, social history and family history    Review of Systems  Musculoskeletal: Positive for back pain and arthralgias. Negative for joint swelling and gait problem.  All other systems reviewed and are negative.       Objective:   Physical Exam    Physical Exam  Vitals reviewed. Constitutional:  appears well-developed and well-nourished. No distress.  HENT:  Head: Normocephalic and atraumatic.  Right Ear: Tympanic membrane, external ear and ear canal normal.  Left Ear: Tympanic membrane, external  ear and ear canal normal.  Nose: Nose normal.  Mouth/Throat: Oropharynx is clear and moist. No oropharyngeal exudate.  Eyes: Conjunctivae and EOM are normal. Pupils are equal, round, and reactive to light. Right eye exhibits no discharge. Left eye exhibits no discharge. No scleral icterus.  Neck: Neck supple. No thyromegaly present.  Cardiovascular: Normal rate, regular rhythm and normal heart sounds.  Exam reveals no gallop and no friction rub.   No murmur heard. Pulmonary/Chest: Effort normal and breath sounds normal. No respiratory distress.  has no wheezes.  has no rales.  Abdomen: Soft nondistended nontender to palpation. Positive bowel sounds. No masses or organomegaly. Lymphadenopathy:   no cervical adenopathy.  Neurological:  is alert.  Skin: Skin is warm and dry.  not diaphoretic.  Psychiatric: normal mood and affect.  Diabetic foot exam: No wounds ulcerations noted. Monofilament exam normal bilaterally.    Assessment & Plan:

## 2010-09-08 NOTE — Assessment & Plan Note (Signed)
Status post gastric bypass surgery. Obtain B12 level. Takes monthly B12 injections. Dietary modification exercise and weight loss recommended

## 2010-09-08 NOTE — Assessment & Plan Note (Signed)
Patient requests referral to Dr. Ernest Mallick of [facility]

## 2010-09-08 NOTE — Assessment & Plan Note (Signed)
Stable. Thyroid function tests up-to-date January 2012.

## 2010-09-10 ENCOUNTER — Telehealth: Payer: Self-pay

## 2010-09-10 NOTE — Telephone Encounter (Signed)
Message copied by Beverely Low on Wed Sep 10, 2010  1:44 PM ------      Message from: Staci Righter      Created: Tue Sep 09, 2010  9:46 PM       Cholesterol under good control. Blood sugar average elevated 7.6. (working on diet/exercise and made med adjustment at visit). Mild amount of protein in urine likely from diabetes. May benefit from small dose ace inhibitor such as enalapril 2.5mg  po qd.

## 2010-09-10 NOTE — Telephone Encounter (Signed)
Attempted to call pt. No voicemail ever picked up therefore unable to leave message

## 2010-09-12 ENCOUNTER — Telehealth: Payer: Self-pay

## 2010-09-12 MED ORDER — ENALAPRIL MALEATE 2.5 MG PO TABS: 2.5000 mg | ORAL_TABLET | Freq: Every day | ORAL | Status: DC

## 2010-09-12 NOTE — Telephone Encounter (Signed)
Done. See lab phone note

## 2010-09-12 NOTE — Telephone Encounter (Signed)
Message copied by Beverely Low on Fri Sep 12, 2010  2:02 PM ------      Message from: Staci Righter      Created: Tue Sep 09, 2010  9:46 PM       Cholesterol under good control. Blood sugar average elevated 7.6. (working on diet/exercise and made med adjustment at visit). Mild amount of protein in urine likely from diabetes. May benefit from small dose ace inhibitor such as enalapril 2.5mg  po qd.

## 2010-09-19 ENCOUNTER — Other Ambulatory Visit: Payer: Self-pay | Admitting: Internal Medicine

## 2010-09-19 ENCOUNTER — Other Ambulatory Visit: Payer: Self-pay

## 2010-09-19 MED ORDER — MELOXICAM 15 MG PO TABS: 15.0000 mg | ORAL_TABLET | Freq: Every day | ORAL | Status: DC | PRN

## 2010-09-19 NOTE — Telephone Encounter (Signed)
Pt is willing to try meloxicam. Rx sent to pharm

## 2010-09-23 ENCOUNTER — Other Ambulatory Visit: Payer: Self-pay | Admitting: Internal Medicine

## 2010-09-23 DIAGNOSIS — Z1231 Encounter for screening mammogram for malignant neoplasm of breast: Secondary | ICD-10-CM

## 2010-10-02 ENCOUNTER — Ambulatory Visit
Admission: RE | Admit: 2010-10-02 | Discharge: 2010-10-02 | Disposition: A | Discharge status: Home / Self Care | Payer: BC Managed Care – PPO | Source: Ambulatory Visit | Attending: Internal Medicine | Admitting: Internal Medicine

## 2010-10-02 DIAGNOSIS — Z1231 Encounter for screening mammogram for malignant neoplasm of breast: Secondary | ICD-10-CM

## 2010-10-13 ENCOUNTER — Ambulatory Visit: Payer: BC Managed Care – PPO | Admitting: Internal Medicine

## 2010-10-20 ENCOUNTER — Ambulatory Visit (INDEPENDENT_AMBULATORY_CARE_PROVIDER_SITE_OTHER): Payer: BC Managed Care – PPO | Admitting: Gastroenterology

## 2010-10-20 ENCOUNTER — Encounter: Payer: Self-pay | Admitting: Gastroenterology

## 2010-10-20 VITALS — BP 100/70 | HR 76 | Ht 65.0 in | Wt 181.0 lb

## 2010-10-20 DIAGNOSIS — Z1211 Encounter for screening for malignant neoplasm of colon: Secondary | ICD-10-CM

## 2010-10-20 DIAGNOSIS — R1319 Other dysphagia: Secondary | ICD-10-CM

## 2010-10-20 DIAGNOSIS — Z800 Family history of malignant neoplasm of digestive organs: Secondary | ICD-10-CM

## 2010-10-20 DIAGNOSIS — K219 Gastro-esophageal reflux disease without esophagitis: Secondary | ICD-10-CM

## 2010-10-20 MED ORDER — PEG-KCL-NACL-NASULF-NA ASC-C 100 G PO SOLR: 1.0000 | Freq: Once | ORAL | Status: DC

## 2010-10-20 NOTE — Patient Instructions (Addendum)
You have been scheduled for a Upper Endoscopy/ Colonoscopy with propofol. Separate instructions given. You have scheduled for a Barium Swallow. Arrive 15 minutes early.  cc: Charlynn Court, MD        Graylon Gunning, MD

## 2010-10-20 NOTE — Progress Notes (Signed)
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 Her mother developed colon cancer at age 42 and she has a maternal aunt with esophageal cancer. He underwent colonoscopy in June 2008 and was recommended return for a colonoscopy in June 2009 she did not return for followup she has no active colorectal complaints. The past few months she has developed intermittent solid food dysphagia. She has had mild dysphagia but she localizes to the back of her throat since her cervical laminectomy in 2001 more recently she has had dysphagia that she localizes to her upper chest to solid foods which she feels is clearly different from her prior dysphagia. She denies odynophagia, weight loss, nausea, vomiting, chest pain, abdominal pain, melena, hematochezia, change in bowel habits.  Past Medical History  Diagnosis Date  . GERD (gastroesophageal reflux disease)   . Arthritis   . Asthma   . Cancer   . Depression   . Diabetes mellitus   . Chronic headaches   . Allergy   . Hypertension   . Migraines   . Thyroid disease   . Hyperlipidemia   . Shingles   . Pneumonia   . RLS (restless legs syndrome)   . Fibromyalgia   . Obesity   . Cancer of sebaceous glands    Past Surgical History  Procedure Date  . Abdominal exploration surgery   . Bunionectomy   . Cervical laminectomy 2000  . Roux-en-y procedure 2009  . Rotator cuff repair 01/2010    right  . Tonsillectomy     reports that she has quit smoking. She has never used smokeless tobacco. She reports that she does not drink alcohol or use illicit drugs. family history includes Arthritis in her mother, paternal grandfather, and paternal grandmother; Cancer in her father, mother, and paternal grandfather; Diabetes in her paternal uncle; and Heart disease in her paternal grandmother. No Known Allergies  Outpatient Encounter Prescriptions as of 10/20/2010  Medication Sig Dispense Refill  .  budesonide-formoterol (SYMBICORT) 80-4.5 MCG/ACT inhaler Inhale 2 puffs into the lungs as needed.  1 Inhaler  3  . buPROPion (WELLBUTRIN XL) 150 MG 24 hr tablet Take 1 tablet (150 mg total) by mouth every morning.  90 tablet  3  . butalbital-acetaminophen-caffeine (FIORICET, ESGIC) 50-325-40 MG per tablet Take 2 tablets by mouth as needed.  14 tablet  3  . cetirizine (ZYRTEC) 10 MG tablet Take 10 mg by mouth daily.        . cyanocobalamin (,VITAMIN B-12,) 1000 MCG/ML injection Inject 1 mL (1,000 mcg total) into the muscle every 30 (thirty) days.  10 mL  0  . DULoxetine (CYMBALTA) 60 MG capsule Take 1 capsule (60 mg total) by mouth daily.  90 capsule  3  . enalapril (VASOTEC) 2.5 MG tablet Take 1 tablet (2.5 mg total) by mouth daily.  90 tablet  3  . esomeprazole (NEXIUM) 40 MG capsule Take 1 capsule (40 mg total) by mouth 2 (two) times daily.  180 capsule  3  . fish oil-omega-3 fatty acids 1000 MG capsule Take 1 g by mouth daily.        Marland Kitchen gabapentin (NEURONTIN) 300 MG capsule Take 1 capsule (300 mg total) by mouth 3 (three) times daily.  270 capsule  3  . glucosamine-chondroitin 500-400 MG tablet Take 1 tablet by mouth 3 (three) times daily.        Marland Kitchen levothyroxine (SYNTHROID, LEVOTHROID) 100 MCG tablet Take 1 tablet (100 mcg total) by mouth  daily.  90 tablet  3  . LORazepam (ATIVAN) 1 MG tablet Take 1 tablet (1 mg total) by mouth at bedtime.  30 tablet  3  . meloxicam (MOBIC) 15 MG tablet Take 1 tablet (15 mg total) by mouth daily as needed for pain.  30 tablet  3  . Multiple Vitamin (MULTIVITAMIN) capsule Take 1 capsule by mouth daily.        . naratriptan (AMERGE) 2.5 MG tablet Take 1 tablet (2.5 mg total) by mouth as needed. Take one (1) tablet at onset of headache; if returns or does not resolve, may repeat after 4 hours; do not exceed five (5) mg in 24 hours.  10 tablet  3  . Olopatadine HCl (PATADAY) 0.2 % SOLN Apply to eye.        . simvastatin (ZOCOR) 40 MG tablet Take 1 tablet (40 mg total)  by mouth at bedtime.  90 tablet  3  . Thiamine HCl (VITAMIN B-1) 100 MG tablet Take 100 mg by mouth daily.        . valACYclovir (VALTREX) 500 MG tablet Take 1 tablet (500 mg total) by mouth 2 (two) times daily.  180 tablet  3  . DISCONTD: Cyanocobalamin (VITAMIN B-12 IJ) Inject 1,000 mcg as directed every 30 (thirty) days.        . peg 3350 powder (MOVIPREP) 100 G SOLR Take 1 kit (100 g total) by mouth once.  1 kit  0  . DISCONTD: oxyCODONE-acetaminophen (PERCOCET) 5-325 MG per tablet         Review of Systems: Pertinent positive and negative review of systems were noted in the above HPI section. All other review of systems were otherwise negative.  Physical Exam: General: Well developed , well nourished, no acute distress Head: Normocephalic and atraumatic Eyes:  sclerae anicteric, EOMI Ears: Normal auditory acuity Mouth: No deformity or lesions Neck: Supple, no masses or thyromegaly Lungs: Clear throughout to auscultation Heart: Regular rate and rhythm; no murmurs, rubs or bruits Abdomen: Soft, non tender and non distended. No masses, hepatosplenomegaly or hernias noted. Normal Bowel sounds Rectal: Deferred to colonoscopy Musculoskeletal: Symmetrical with no gross deformities  Skin: No lesions on visible extremities Pulses:  Normal pulses noted Extremities: No clubbing, cyanosis, edema or deformities noted Neurological: Alert oriented x 4, grossly nonfocal Cervical Nodes:  No significant cervical adenopathy Inguinal Nodes: No significant inguinal adenopathy Psychological:  Alert and cooperative. Normal mood and affect  Assessment and Recommendations:  1. Family history of colon cancer in her mother. 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 Overdue for colonoscopy. Schedule colonoscopy. The risks, benefits, and alternatives to colonoscopy with possible biopsy and possible polypectomy were discussed with the patient and they consent to proceed.   2. Dysphagia. Rule out esophageal strictures,  neoplasms or other disorders. Schedule barium esophagram. Schedule upper endoscopy with possible dilation. The risks, benefits, and alternatives to endoscopy with possible biopsy and possible dilation were discussed with the patient and they consent to proceed.   3. GERD. Continue Nexium 40 mg twice daily along with standard antireflux measures.

## 2010-10-21 ENCOUNTER — Encounter: Payer: Self-pay | Admitting: Gastroenterology

## 2010-10-23 ENCOUNTER — Ambulatory Visit (HOSPITAL_COMMUNITY)
Admission: RE | Admit: 2010-10-23 | Discharge: 2010-10-23 | Disposition: A | Discharge status: Home / Self Care | Payer: BC Managed Care – PPO | Source: Ambulatory Visit | Attending: Gastroenterology | Admitting: Gastroenterology

## 2010-10-23 DIAGNOSIS — R1319 Other dysphagia: Secondary | ICD-10-CM

## 2010-10-23 DIAGNOSIS — Z79899 Other long term (current) drug therapy: Secondary | ICD-10-CM | POA: Insufficient documentation

## 2010-10-23 DIAGNOSIS — Z1211 Encounter for screening for malignant neoplasm of colon: Secondary | ICD-10-CM

## 2010-10-23 DIAGNOSIS — K219 Gastro-esophageal reflux disease without esophagitis: Secondary | ICD-10-CM

## 2010-10-23 DIAGNOSIS — K224 Dyskinesia of esophagus: Secondary | ICD-10-CM | POA: Insufficient documentation

## 2010-11-03 ENCOUNTER — Ambulatory Visit (INDEPENDENT_AMBULATORY_CARE_PROVIDER_SITE_OTHER): Payer: BC Managed Care – PPO | Admitting: Internal Medicine

## 2010-11-03 ENCOUNTER — Encounter: Payer: Self-pay | Admitting: Internal Medicine

## 2010-11-03 ENCOUNTER — Ambulatory Visit: Payer: BC Managed Care – PPO | Admitting: Internal Medicine

## 2010-11-03 DIAGNOSIS — E039 Hypothyroidism, unspecified: Secondary | ICD-10-CM

## 2010-11-03 DIAGNOSIS — I10 Essential (primary) hypertension: Secondary | ICD-10-CM

## 2010-11-03 DIAGNOSIS — E119 Type 2 diabetes mellitus without complications: Secondary | ICD-10-CM

## 2010-11-03 MED ORDER — GLIPIZIDE ER 10 MG PO TB24: 10.0000 mg | ORAL_TABLET | Freq: Every day | ORAL | Status: DC

## 2010-11-03 MED ORDER — SAXAGLIPTIN-METFORMIN ER 5-500 MG PO TB24: 5.0000 | ORAL_TABLET | ORAL | Status: DC

## 2010-11-03 NOTE — Progress Notes (Signed)
  Subjective:    Patient ID: Rhonda Farley, female    DOB: 03-01-1951, 60 y.o.   MRN: [id number]  HPI  60 year old patient who is seen today for followup of her type 2 diabetes. 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 She is in the process of her annual cancer screening. She has been seen by GI for upper endoscopy. Her glipizide has been up titrated to 10 mg daily and she still has elevated blood sugars in excess of 200. She states that years ago she was intolerant of metformin due to diarrhea. She otherwise feels well. She has treated hypertension which has been stable   Review of Systems  Constitutional: Negative.   HENT: Negative for hearing loss, congestion, sore throat, rhinorrhea, dental problem, sinus pressure and tinnitus.   Eyes: Negative for pain, discharge and visual disturbance.  Respiratory: Negative for cough and shortness of breath.   Cardiovascular: Negative for chest pain, palpitations and leg swelling.  Gastrointestinal: Negative for nausea, vomiting, abdominal pain, diarrhea, constipation, blood in stool and abdominal distention.  Genitourinary: Negative for dysuria, urgency, frequency, hematuria, flank pain, vaginal bleeding, vaginal discharge, difficulty urinating, vaginal pain and pelvic pain.  Musculoskeletal: Negative for joint swelling, arthralgias and gait problem.  Skin: Negative for rash.  Neurological: Negative for dizziness, syncope, speech difficulty, weakness, numbness and headaches.  Hematological: Negative for adenopathy.  Psychiatric/Behavioral: Negative for behavioral problems, dysphoric mood and agitation. The patient is not nervous/anxious.        Objective:   Physical Exam  Constitutional: She is oriented to person, place, and time. She appears well-developed and well-nourished.  HENT:  Head: Normocephalic.  Right Ear: External ear normal.  Left Ear: External ear normal.  Mouth/Throat:  Oropharynx is clear and moist.  Eyes: Conjunctivae and EOM are normal. Pupils are equal, round, and reactive to light.  Neck: Normal range of motion. Neck supple. No thyromegaly present.  Cardiovascular: Normal rate, regular rhythm, normal heart sounds and intact distal pulses.   Pulmonary/Chest: Effort normal and breath sounds normal.  Abdominal: Soft. Bowel sounds are normal. She exhibits no mass. There is no tenderness.  Musculoskeletal: Normal range of motion.  Lymphadenopathy:    She has no cervical adenopathy.  Neurological: She is alert and oriented to person, place, and time.  Skin: Skin is warm and dry. No rash noted.  Psychiatric: She has a normal mood and affect. Her behavior is normal.          Assessment & Plan:    Diabetes mellitus. Will give a trial of combination metformin/kombiglyze. If she is intolerant of metformin we'll discontinue Hypertension well controlled  We'll recheck in 2 months. Hemoglobin A1c will be checked at that time

## 2010-11-03 NOTE — Patient Instructions (Signed)
Please check your hemoglobin A1c every 3 months  Return office visit in 6 weeks   Please check your hemoglobin A1c every 3 months    It is important that you exercise regularly, at least 20 minutes 3 to 4 times per week.  If you develop chest pain or shortness of breath seek  medical attention.

## 2010-11-10 ENCOUNTER — Telehealth: Payer: Self-pay | Admitting: *Deleted

## 2010-11-10 ENCOUNTER — Other Ambulatory Visit (INDEPENDENT_AMBULATORY_CARE_PROVIDER_SITE_OTHER): Payer: BC Managed Care – PPO | Admitting: Internal Medicine

## 2010-11-10 DIAGNOSIS — R252 Cramp and spasm: Secondary | ICD-10-CM

## 2010-11-10 LAB — BASIC METABOLIC PANEL
CO2: 30 mEq/L (ref 19–32)
Calcium: 9 mg/dL (ref 8.4–10.5)
Creatinine, Ser: 0.7 mg/dL (ref 0.4–1.2)

## 2010-11-10 NOTE — Telephone Encounter (Signed)
patient  Is complaining of muscle spasms in her hands, arms, and legs for about a week.  She is worried because she has a GI prep coming up.  She would like to know if she can come in for lab work to have her electrolytes checked?

## 2010-11-10 NOTE — Telephone Encounter (Signed)
Please advise 

## 2010-11-10 NOTE — Telephone Encounter (Signed)
Okay for him electrolytes but suggest she consume Gatorade or another sports drink for the next one or 2 days. Okay to defer laboratory testing unless symptoms persist

## 2010-11-10 NOTE — Telephone Encounter (Signed)
patient wants  labs today.

## 2010-11-12 ENCOUNTER — Ambulatory Visit (AMBULATORY_SURGERY_CENTER): Payer: BC Managed Care – PPO | Admitting: Gastroenterology

## 2010-11-12 ENCOUNTER — Encounter: Payer: Self-pay | Admitting: Gastroenterology

## 2010-11-12 DIAGNOSIS — D126 Benign neoplasm of colon, unspecified: Secondary | ICD-10-CM

## 2010-11-12 DIAGNOSIS — K219 Gastro-esophageal reflux disease without esophagitis: Secondary | ICD-10-CM

## 2010-11-12 DIAGNOSIS — Z1211 Encounter for screening for malignant neoplasm of colon: Secondary | ICD-10-CM

## 2010-11-12 DIAGNOSIS — R1319 Other dysphagia: Secondary | ICD-10-CM

## 2010-11-12 LAB — GLUCOSE, CAPILLARY: Glucose-Capillary: 132 mg/dL — ABNORMAL HIGH (ref 70–99)

## 2010-11-12 MED ORDER — SODIUM CHLORIDE 0.9 % IV SOLN: 500.0000 mL | INTRAVENOUS | Status: DC

## 2010-11-12 NOTE — Patient Instructions (Signed)
FOLLOW DISCHARGE INSTRUCTIONS (BLUE & GREEN SHEETS)   ANTI REFLUX INFORMATION GIVEN     FOLLOW POST ESOPHAGEAL DILATATION DIET TODAY      CONTINUE REFLUX MEDICATION  AWAIT PATHOLOGY REPORT ON COLON POLYP REMOVED TODAY      REPEAT COLONOSCOPY IN 2 YEARS

## 2010-11-13 ENCOUNTER — Telehealth: Payer: Self-pay

## 2010-11-13 NOTE — Telephone Encounter (Signed)

## 2010-11-22 ENCOUNTER — Encounter: Payer: Self-pay | Admitting: Gastroenterology

## 2010-12-17 ENCOUNTER — Ambulatory Visit: Payer: BC Managed Care – PPO | Admitting: Internal Medicine

## 2010-12-23 ENCOUNTER — Ambulatory Visit: Payer: BC Managed Care – PPO | Admitting: Internal Medicine

## 2011-01-09 LAB — URINE MICROSCOPIC-ADD ON

## 2011-01-09 LAB — URINALYSIS, ROUTINE W REFLEX MICROSCOPIC
Specific Gravity, Urine: 1.022
Urobilinogen, UA: 0.2
pH: 5.5

## 2011-01-20 LAB — GRAM STAIN

## 2011-01-20 LAB — URINALYSIS, ROUTINE W REFLEX MICROSCOPIC
Hgb urine dipstick: NEGATIVE
Specific Gravity, Urine: 1.022
Urobilinogen, UA: 1

## 2011-01-20 LAB — CK TOTAL AND CKMB (NOT AT ARMC)
CK, MB: 0.9
Relative Index: INVALID
Total CK: 34

## 2011-01-20 LAB — DIFFERENTIAL
Basophils Absolute: 0.1
Basophils Relative: 1
Basophils Relative: 1
Eosinophils Absolute: 0.3
Eosinophils Relative: 3
Lymphs Abs: 2.7
Monocytes Absolute: 0.6
Monocytes Absolute: 0.9 — ABNORMAL HIGH
Monocytes Relative: 8

## 2011-01-20 LAB — CSF CELL COUNT WITH DIFFERENTIAL
Tube #: 1
WBC, CSF: 2

## 2011-01-20 LAB — CARDIAC PANEL(CRET KIN+CKTOT+MB+TROPI)
CK, MB: 1.1
CK, MB: 1.9
Relative Index: INVALID
Total CK: 34
Total CK: 68
Troponin I: 0.01

## 2011-01-20 LAB — COMPREHENSIVE METABOLIC PANEL
AST: 30
Albumin: 3.4 — ABNORMAL LOW
Alkaline Phosphatase: 103
Chloride: 101
GFR calc Af Amer: >60
Potassium: 4.5
Sodium: 137
Total Bilirubin: 0.4

## 2011-01-20 LAB — BASIC METABOLIC PANEL
CO2: 31
Calcium: 8.9
Chloride: 100
Creatinine, Ser: 0.61
Creatinine, Ser: 0.7
GFR calc Af Amer: >60
GFR calc Af Amer: >60
GFR calc non Af Amer: >60
Potassium: 3.8
Sodium: 139
Sodium: 141

## 2011-01-20 LAB — CBC
HCT: 42.1
Hemoglobin: 13.8
Hemoglobin: 14.3
MCHC: 33.9
MCV: 89.8
Platelets: 253
RBC: 4.58
RBC: 4.7
WBC: 11.1 — ABNORMAL HIGH
WBC: 8.1
WBC: 8.5

## 2011-01-20 LAB — URINE MICROSCOPIC-ADD ON

## 2011-01-20 LAB — HSV PCR
HSV 2 , PCR: NOT DETECTED
HSV 2 , PCR: NOT DETECTED
HSV, PCR: NOT DETECTED

## 2011-01-20 LAB — POCT CARDIAC MARKERS
CKMB, poc: <1 — ABNORMAL LOW
Myoglobin, poc: 44.6
Operator id: 257131
Troponin i, poc: <0.05

## 2011-01-20 LAB — TSH: TSH: 1.02

## 2011-01-20 LAB — RAPID URINE DRUG SCREEN, HOSP PERFORMED
Amphetamines: NOT DETECTED
Barbiturates: NOT DETECTED

## 2011-05-25 ENCOUNTER — Encounter: Payer: Self-pay | Admitting: Internal Medicine

## 2011-05-25 ENCOUNTER — Ambulatory Visit (INDEPENDENT_AMBULATORY_CARE_PROVIDER_SITE_OTHER): Payer: BC Managed Care – PPO | Admitting: Internal Medicine

## 2011-05-25 VITALS — BP 118/80 | Temp 98.0°F | Wt 187.0 lb

## 2011-05-25 DIAGNOSIS — K219 Gastro-esophageal reflux disease without esophagitis: Secondary | ICD-10-CM

## 2011-05-25 DIAGNOSIS — M549 Dorsalgia, unspecified: Secondary | ICD-10-CM

## 2011-05-25 DIAGNOSIS — I10 Essential (primary) hypertension: Secondary | ICD-10-CM

## 2011-05-25 DIAGNOSIS — E039 Hypothyroidism, unspecified: Secondary | ICD-10-CM

## 2011-05-25 DIAGNOSIS — E669 Obesity, unspecified: Secondary | ICD-10-CM

## 2011-05-25 DIAGNOSIS — E119 Type 2 diabetes mellitus without complications: Secondary | ICD-10-CM

## 2011-05-25 DIAGNOSIS — R300 Dysuria: Secondary | ICD-10-CM

## 2011-05-25 LAB — POCT URINALYSIS DIPSTICK
Bilirubin, UA: NEGATIVE
Glucose, UA: NEGATIVE
Ketones, UA: NEGATIVE
Leukocytes, UA: NEGATIVE
Spec Grav, UA: 1.025

## 2011-05-25 LAB — HEMOGLOBIN A1C: Hgb A1c MFr Bld: 7.8 % — ABNORMAL HIGH (ref 4.6–6.5)

## 2011-05-25 LAB — TSH: TSH: 0.11 u[IU]/mL — ABNORMAL LOW (ref 0.35–5.50)

## 2011-05-25 NOTE — Patient Instructions (Signed)
Limit your sodium (Salt) intake   Please check your hemoglobin A1c every 3 months    It is important that you exercise regularly, at least 20 minutes 3 to 4 times per week.  If you develop chest pain or shortness of breath seek  medical attention.  You need to lose weight.  Consider a lower calorie diet and regular exercise. 

## 2011-05-25 NOTE — Progress Notes (Signed)
  Subjective:    Patient ID: Rhonda Farley, female    DOB: November 26, 1950, 61 y.o.   MRN: [id number]  HPI  61 year old patient who is seen today for followup. She has a history of type 2 diabetes mellitus seen infrequently. Her last hemoglobin A1c about 8 months ago was 7.6 she says her blood sugars continue to fluctuate and at times are in excess of 250. She has a long history of hypothyroidism and there has been some recent weight gain and she was concerned about proper dosing. Presently she is on a dose of 0.15 mg but states that she required 0.125 mg for a number of years. She describes some situational stress due to to a live-in mother-in-law and some associated insomnia. She does use lorazepam with benefit. She has chronic low back pain and is scheduled for neurosurgical followup later this week. She has had a recent eye examination.    Review of Systems  Constitutional: Positive for unexpected weight change.  HENT: Negative for hearing loss, congestion, sore throat, rhinorrhea, dental problem, sinus pressure and tinnitus.   Eyes: Negative for pain, discharge and visual disturbance.  Respiratory: Negative for cough and shortness of breath.   Cardiovascular: Negative for chest pain, palpitations and leg swelling.  Gastrointestinal: Negative for nausea, vomiting, abdominal pain, diarrhea, constipation, blood in stool and abdominal distention.  Genitourinary: Negative for dysuria, urgency, frequency, hematuria, flank pain, vaginal bleeding, vaginal discharge, difficulty urinating, vaginal pain and pelvic pain.  Musculoskeletal: Negative for joint swelling, arthralgias and gait problem.  Skin: Negative for rash.  Neurological: Negative for dizziness, syncope, speech difficulty, weakness, numbness and headaches.  Hematological: Negative for adenopathy.  Psychiatric/Behavioral: Positive for sleep disturbance. Negative for behavioral problems, dysphoric mood and agitation. The patient is  nervous/anxious.        Objective:   Physical Exam  Constitutional: She is oriented to person, place, and time. She appears well-developed and well-nourished.  HENT:  Head: Normocephalic.  Right Ear: External ear normal.  Left Ear: External ear normal.  Mouth/Throat: Oropharynx is clear and moist.  Eyes: Conjunctivae and EOM are normal. Pupils are equal, round, and reactive to light.  Neck: Normal range of motion. Neck supple. No thyromegaly present.  Cardiovascular: Normal rate, regular rhythm, normal heart sounds and intact distal pulses.   Pulmonary/Chest: Effort normal and breath sounds normal.  Abdominal: Soft. Bowel sounds are normal. She exhibits no mass. There is no tenderness.  Musculoskeletal: Normal range of motion.  Lymphadenopathy:    She has no cervical adenopathy.  Neurological: She is alert and oriented to person, place, and time.  Skin: Skin is warm and dry. No rash noted.  Psychiatric: She has a normal mood and affect. Her behavior is normal.          Assessment & Plan:    Diabetes mellitus. Will increase Kombiglyze  To 08/998 daily we'll check a hemoglobin A1c Hypertension well controlled we'll continue present regimen Hypothyroidism. We'll check a followup TSH especially in view of her recent weight gain. Low back pain. Followup neurosurgery

## 2011-05-26 NOTE — Progress Notes (Signed)
Quick Note:  Spoke with pt - dr. Amador Cunas called and spoke with her yesterday ______

## 2011-06-05 ENCOUNTER — Telehealth: Payer: Self-pay | Admitting: *Deleted

## 2011-06-05 MED ORDER — SAXAGLIPTIN-METFORMIN ER 5-1000 MG PO TB24: 1.0000 | ORAL_TABLET | Freq: Every day | ORAL | Status: DC

## 2011-06-05 NOTE — Telephone Encounter (Signed)
Pt wants Dr. Kirtland Bouchard to know that her Rhonda Farley is working well, and does not want to change the dose, please.

## 2011-06-05 NOTE — Telephone Encounter (Signed)
Patient called back to clarify the med request.  She states she was taking a lower dose of Kombiglyze because of GI upset but is tolerated the medication now and would like it increased with a refill if possible

## 2011-06-05 NOTE — Telephone Encounter (Signed)
Increase 08-998  #90 one daily

## 2011-07-02 ENCOUNTER — Ambulatory Visit (INDEPENDENT_AMBULATORY_CARE_PROVIDER_SITE_OTHER): Payer: BC Managed Care – PPO | Admitting: Internal Medicine

## 2011-07-02 ENCOUNTER — Encounter: Payer: Self-pay | Admitting: Internal Medicine

## 2011-07-02 VITALS — BP 110/82 | Temp 98.2°F | Wt 182.0 lb

## 2011-07-02 DIAGNOSIS — I10 Essential (primary) hypertension: Secondary | ICD-10-CM

## 2011-07-02 DIAGNOSIS — E119 Type 2 diabetes mellitus without complications: Secondary | ICD-10-CM

## 2011-07-02 MED ORDER — HYDROCODONE-HOMATROPINE 5-1.5 MG/5ML PO SYRP: 5.0000 mL | ORAL_SOLUTION | Freq: Four times a day (QID) | ORAL | Status: AC | PRN

## 2011-07-02 MED ORDER — SAXAGLIPTIN-METFORMIN ER 2.5-1000 MG PO TB24: 1.0000 | ORAL_TABLET | Freq: Two times a day (BID) | ORAL | Status: DC

## 2011-07-02 NOTE — Patient Instructions (Signed)
Get plenty of rest, Drink lots of  clear liquids, and use Tylenol or ibuprofen for fever and discomfort.     Please check your hemoglobin A1c every 3 months  Hold vasotec

## 2011-07-02 NOTE — Progress Notes (Signed)
  Subjective:    Patient ID: Rhonda Farley, female    DOB: [birth year], 61 y.o.   MRN: [id number]  HPI  61 year old patient who is in today for followup. She presents with a three-week history of nonproductive cough. This occurs mainly at night when she is supine. 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 Denies any fever or constitutional complaints or sputum production the cough does interfere with sleep and she has noted some daytime fatigue. Medical regimen includes; as well as Vasotec. She has very little daytime coughing. She gave herself a trial off Vasotec short-term without benefit She has a history of diabetes which remains of poorly controlled fasting blood sugar today 164. She states blood sugars are often in excess of 200 fasting     Review of Systems  Constitutional: Positive for fatigue.  HENT: Positive for rhinorrhea. Negative for hearing loss, congestion, sore throat, dental problem, sinus pressure and tinnitus.   Eyes: Negative for pain, discharge and visual disturbance.  Respiratory: Positive for cough. Negative for shortness of breath.   Cardiovascular: Negative for chest pain, palpitations and leg swelling.  Gastrointestinal: Negative for nausea, vomiting, abdominal pain, diarrhea, constipation, blood in stool and abdominal distention.  Genitourinary: Negative for dysuria, urgency, frequency, hematuria, flank pain, vaginal bleeding, vaginal discharge, difficulty urinating, vaginal pain and pelvic pain.  Musculoskeletal: Negative for joint swelling, arthralgias and gait problem.  Skin: Negative for rash.  Neurological: Negative for dizziness, syncope, speech difficulty, weakness, numbness and headaches.  Hematological: Negative for adenopathy.  Psychiatric/Behavioral: Negative for behavioral problems, dysphoric mood and agitation. The patient is not nervous/anxious.          Objective:   Physical Exam  Constitutional: She is oriented to person, place, and time. She appears well-developed and well-nourished.  HENT:  Head: Normocephalic.  Right Ear: External ear normal.  Left Ear: External ear normal.  Mouth/Throat: Oropharynx is clear and moist.  Eyes: Conjunctivae and EOM are normal. Pupils are equal, round, and reactive to light.  Neck: Normal range of motion. Neck supple. No thyromegaly present.  Cardiovascular: Normal rate, regular rhythm, normal heart sounds and intact distal pulses.   Pulmonary/Chest: Effort normal and breath sounds normal. No respiratory distress. She has no wheezes. She has no rales.       Chest is clear O2 saturation 98% Pulse rate 100  Abdominal: Soft. Bowel sounds are normal. She exhibits no mass. There is no tenderness.  Musculoskeletal: Normal range of motion.  Lymphadenopathy:    She has no cervical adenopathy.  Neurological: She is alert and oriented to person, place, and time.  Skin: Skin is warm and dry. No rash noted.  Psychiatric: She has a normal mood and affect. Her behavior is normal.          Assessment & Plan:  Viral URI. Will treat symptomatically with Hydromet at bedtime We'll substitute Benicar for Vasotec short-term Diabetes. We'll intensify oral medications and recheck hemoglobin A1c in 3 months

## 2011-07-22 ENCOUNTER — Telehealth: Payer: Self-pay | Admitting: Internal Medicine

## 2011-07-22 NOTE — Telephone Encounter (Signed)
Pt called req refills for the Saxagliptin-Metformin (KOMBIGLYZE XR) 5-500 MG TB24 and Saxagliptin-Metformin 2.08-998 MG TB24 to [facility] fax # [phone].

## 2011-07-23 MED ORDER — SAXAGLIPTIN-METFORMIN ER 2.5-1000 MG PO TB24: 1.0000 | ORAL_TABLET | Freq: Two times a day (BID) | ORAL | Status: DC

## 2011-07-23 NOTE — Telephone Encounter (Signed)
i called pt and discussed  that I only had 1 listed on med list - sht sttes dr. Jenene Slicker gave her both strengths and instructed to take 1 in AM and the other in PM . I will refill the one on the med list - and will have to wait for him to return to office to get instructions on both meds. Verbalized understanding

## 2011-07-27 NOTE — Telephone Encounter (Signed)
Continue the prior prescription as stated on the medication list

## 2011-07-27 NOTE — Telephone Encounter (Signed)
Spoke with husband - instructed to tell her that 2.5-100 twice a day is the rx she needs to take now that samples on other med have run out - I have sent rx to pharmacy

## 2011-08-24 ENCOUNTER — Ambulatory Visit: Payer: BC Managed Care – PPO | Admitting: Internal Medicine

## 2011-09-01 ENCOUNTER — Ambulatory Visit (INDEPENDENT_AMBULATORY_CARE_PROVIDER_SITE_OTHER): Payer: BC Managed Care – PPO | Admitting: Internal Medicine

## 2011-09-01 ENCOUNTER — Encounter: Payer: Self-pay | Admitting: Internal Medicine

## 2011-09-01 ENCOUNTER — Other Ambulatory Visit: Payer: Self-pay

## 2011-09-01 VITALS — BP 100/70 | Temp 98.5°F | Wt 175.0 lb

## 2011-09-01 DIAGNOSIS — I10 Essential (primary) hypertension: Secondary | ICD-10-CM

## 2011-09-01 DIAGNOSIS — E119 Type 2 diabetes mellitus without complications: Secondary | ICD-10-CM

## 2011-09-01 DIAGNOSIS — E039 Hypothyroidism, unspecified: Secondary | ICD-10-CM

## 2011-09-01 MED ORDER — METFORMIN HCL 1000 MG PO TABS: 1000.0000 mg | ORAL_TABLET | Freq: Two times a day (BID) | ORAL | Status: DC

## 2011-09-01 MED ORDER — INSULIN PEN NEEDLE 32G X 4 MM MISC: 1.0000 | Freq: Every day | Status: DC

## 2011-09-01 MED ORDER — LIRAGLUTIDE 18 MG/3ML ~~LOC~~ SOLN: 1.2000 mg | SUBCUTANEOUS | Status: DC

## 2011-09-01 MED ORDER — MONTELUKAST SODIUM 10 MG PO TABS: 10.0000 mg | ORAL_TABLET | Freq: Every day | ORAL | Status: DC

## 2011-09-01 NOTE — Telephone Encounter (Signed)
Open error 

## 2011-09-01 NOTE — Progress Notes (Signed)
  Subjective:    Patient ID: Rhonda Farley, female    DOB: 05/12/1950, 61 y.o.   MRN: [id number]  HPI  61 year old patient who is seen today for followup she has a history of hypothyroidism in the past have been on 0.125 mg of levothyroxin and this has been decreased to 0.1 mg. TSH 3 months ago was suppressed. She denies taking any extra dosing. Is seen today for a followup TSH She has treated diabetes but developed symptomatic hypoglycemia on glipizide in the early and mid afternoon. This persisted in spite of reduction of her glipizide dose from 10 mg to 5 mg She states that she was placed on Vasotec in the past for renal protection but has no history of hypertension blood pressure low normal today Benicar was substituted short-term do to cough which has been unchanged She has a history of allergic rhinitis and asthma    Review of Systems  Constitutional: Negative.   HENT: Positive for congestion and rhinorrhea. Negative for hearing loss, sore throat, dental problem, sinus pressure and tinnitus.   Eyes: Negative for pain, discharge and visual disturbance.  Respiratory: Positive for cough and wheezing. Negative for shortness of breath.   Cardiovascular: Negative for chest pain, palpitations and leg swelling.  Gastrointestinal: Negative for nausea, vomiting, abdominal pain, diarrhea, constipation, blood in stool and abdominal distention.  Genitourinary: Negative for dysuria, urgency, frequency, hematuria, flank pain, vaginal bleeding, vaginal discharge, difficulty urinating, vaginal pain and pelvic pain.  Musculoskeletal: Negative for joint swelling, arthralgias and gait problem.  Skin: Negative for rash.  Neurological: Negative for dizziness, syncope, speech difficulty, weakness, numbness and headaches.  Hematological: Negative for adenopathy.  Psychiatric/Behavioral: Negative for behavioral problems, dysphoric mood and agitation. The patient is not nervous/anxious.        Objective:   Physical Exam  Constitutional: She is oriented to person, place, and time. She appears well-developed and well-nourished.  HENT:  Head: Normocephalic.  Right Ear: External ear normal.  Left Ear: External ear normal.  Mouth/Throat: Oropharynx is clear and moist.  Eyes: Conjunctivae and EOM are normal. Pupils are equal, round, and reactive to light.  Neck: Normal range of motion. Neck supple. No thyromegaly present.  Cardiovascular: Normal rate, regular rhythm, normal heart sounds and intact distal pulses.   Pulmonary/Chest: Effort normal and breath sounds normal.  Abdominal: Soft. Bowel sounds are normal. She exhibits no mass. There is no tenderness.  Musculoskeletal: Normal range of motion.  Lymphadenopathy:    She has no cervical adenopathy.  Neurological: She is alert and oriented to person, place, and time.  Skin: Skin is warm and dry. No rash noted.  Psychiatric: She has a normal mood and affect. Her behavior is normal.          Assessment & Plan:   Type 2 diabetes. We'll discontinue Benicar blood pressure is low normal today. We'll continue to monitor blood pressure off medications. We'll check a hemoglobin A1c will discontinue glipizide and substitute a regimen of metformin and Victoza Hypothyroidism. We'll recheck a TSH Asthma/allergic rhinitis. Will give a trial of Singulair

## 2011-09-01 NOTE — Patient Instructions (Signed)
Please check your hemoglobin A1c every 3 months    It is important that you exercise regularly, at least 20 minutes 3 to 4 times per week.  If you develop chest pain or shortness of breath seek  medical attention.  Please check your blood pressure on a regular basis.  If it is consistently greater than 150/90, please make an office appointment.   

## 2011-09-02 ENCOUNTER — Other Ambulatory Visit: Payer: Self-pay | Admitting: Internal Medicine

## 2011-09-02 LAB — TSH: TSH: 0.11 u[IU]/mL — ABNORMAL LOW (ref 0.35–5.50)

## 2011-09-02 MED ORDER — LEVOTHYROXINE SODIUM 88 MCG PO TABS: 88.0000 ug | ORAL_TABLET | Freq: Every day | ORAL | Status: DC

## 2011-09-02 NOTE — Progress Notes (Signed)
Quick Note:  Attempt to call - VM - LMTCB if questions - informed of lab test results and change to me per Dr. Amador Cunas - [facility]

## 2011-09-09 ENCOUNTER — Telehealth: Payer: Self-pay | Admitting: Family Medicine

## 2011-09-09 NOTE — Telephone Encounter (Signed)
Please advise 

## 2011-09-09 NOTE — Telephone Encounter (Signed)
Spoke with pt- informed of med to add back - change to med list done - she has 10mg  tabs she will split - will let us know if she needs new rx sent for RFs

## 2011-09-09 NOTE — Telephone Encounter (Signed)
Pulled from Triage vmail. Pt called at 10:01am. She was in a week ago and Dr. Kirtland Bouchard upped her DM meds. States that her BS still sits about 200, with no improvement or change. Please call to advise. Thanks!

## 2011-09-09 NOTE — Telephone Encounter (Signed)
Resume glipizide 5 mg daily-  notify patient that her new diabetic medications will take some additional time to reach full efficacy

## 2011-09-29 ENCOUNTER — Other Ambulatory Visit: Payer: Self-pay

## 2011-09-29 MED ORDER — LIRAGLUTIDE 18 MG/3ML ~~LOC~~ SOLN: 1.2000 mg | Freq: Every day | SUBCUTANEOUS | Status: DC

## 2011-09-29 NOTE — Telephone Encounter (Signed)
victoza to mail order

## 2011-10-02 ENCOUNTER — Ambulatory Visit: Payer: BC Managed Care – PPO | Admitting: Internal Medicine

## 2011-11-26 ENCOUNTER — Other Ambulatory Visit: Payer: Self-pay

## 2011-11-26 MED ORDER — DULOXETINE HCL 60 MG PO CPEP: 60.0000 mg | ORAL_CAPSULE | Freq: Every day | ORAL | Status: DC

## 2011-12-03 ENCOUNTER — Ambulatory Visit (INDEPENDENT_AMBULATORY_CARE_PROVIDER_SITE_OTHER): Payer: BC Managed Care – PPO | Admitting: Internal Medicine

## 2011-12-03 ENCOUNTER — Encounter: Payer: Self-pay | Admitting: Internal Medicine

## 2011-12-03 VITALS — BP 118/70 | HR 87 | Temp 98.7°F | Wt 163.0 lb

## 2011-12-03 DIAGNOSIS — E785 Hyperlipidemia, unspecified: Secondary | ICD-10-CM

## 2011-12-03 DIAGNOSIS — E119 Type 2 diabetes mellitus without complications: Secondary | ICD-10-CM

## 2011-12-03 DIAGNOSIS — E039 Hypothyroidism, unspecified: Secondary | ICD-10-CM

## 2011-12-03 DIAGNOSIS — I10 Essential (primary) hypertension: Secondary | ICD-10-CM

## 2011-12-03 DIAGNOSIS — M129 Arthropathy, unspecified: Secondary | ICD-10-CM

## 2011-12-03 DIAGNOSIS — M549 Dorsalgia, unspecified: Secondary | ICD-10-CM

## 2011-12-03 LAB — COMPREHENSIVE METABOLIC PANEL
ALT: 18 U/L (ref 0–35)
AST: 16 U/L (ref 0–37)
Albumin: 3.4 g/dL — ABNORMAL LOW (ref 3.5–5.2)
BUN: 9 mg/dL (ref 6–23)
Calcium: 8.6 mg/dL (ref 8.4–10.5)
Chloride: 107 mEq/L (ref 96–112)
Potassium: 4.1 mEq/L (ref 3.5–5.1)

## 2011-12-03 LAB — CBC WITH DIFFERENTIAL/PLATELET
Basophils Absolute: 0 10*3/uL (ref 0.0–0.1)
HCT: 38.4 % (ref 36.0–46.0)
Lymphs Abs: 2.1 10*3/uL (ref 0.7–4.0)
MCHC: 32.7 g/dL (ref 30.0–36.0)
MCV: 97.1 fl (ref 78.0–100.0)
Monocytes Absolute: 0.5 10*3/uL (ref 0.1–1.0)
Platelets: 214 10*3/uL (ref 150.0–400.0)
RDW: 14.4 % (ref 11.5–14.6)

## 2011-12-03 LAB — LIPID PANEL
Cholesterol: 132 mg/dL (ref 0–200)
LDL Cholesterol: 52 mg/dL (ref 0–99)
Total CHOL/HDL Ratio: 2

## 2011-12-03 LAB — POCT CBG (FASTING - GLUCOSE)-MANUAL ENTRY: Glucose Fasting, POC: 134 mg/dL — AB (ref 70–99)

## 2011-12-03 MED ORDER — LEVOTHYROXINE SODIUM 88 MCG PO TABS: 88.0000 ug | ORAL_TABLET | Freq: Every day | ORAL | Status: DC

## 2011-12-03 MED ORDER — GLIPIZIDE 5 MG PO TABS: 5.0000 mg | ORAL_TABLET | Freq: Every day | ORAL | Status: DC

## 2011-12-03 MED ORDER — NARATRIPTAN HCL 2.5 MG PO TABS: 2.5000 mg | ORAL_TABLET | ORAL | Status: DC | PRN

## 2011-12-03 MED ORDER — BUDESONIDE-FORMOTEROL FUMARATE 80-4.5 MCG/ACT IN AERO: 2.0000 | INHALATION_SPRAY | RESPIRATORY_TRACT | Status: DC | PRN

## 2011-12-03 MED ORDER — LIRAGLUTIDE 18 MG/3ML ~~LOC~~ SOLN: 1.2000 mg | Freq: Every day | SUBCUTANEOUS | Status: DC

## 2011-12-03 MED ORDER — SIMVASTATIN 40 MG PO TABS: 40.0000 mg | ORAL_TABLET | Freq: Every day | ORAL | Status: DC

## 2011-12-03 MED ORDER — BUPROPION HCL ER (XL) 150 MG PO TB24: 150.0000 mg | ORAL_TABLET | ORAL | Status: DC

## 2011-12-03 MED ORDER — GABAPENTIN 300 MG PO CAPS: 300.0000 mg | ORAL_CAPSULE | Freq: Three times a day (TID) | ORAL | Status: DC

## 2011-12-03 MED ORDER — LORAZEPAM 1 MG PO TABS: 1.0000 mg | ORAL_TABLET | Freq: Every day | ORAL | Status: DC

## 2011-12-03 MED ORDER — DULOXETINE HCL 60 MG PO CPEP: 60.0000 mg | ORAL_CAPSULE | Freq: Every day | ORAL | Status: DC

## 2011-12-03 MED ORDER — ESOMEPRAZOLE MAGNESIUM 40 MG PO CPDR: 40.0000 mg | DELAYED_RELEASE_CAPSULE | Freq: Two times a day (BID) | ORAL | Status: DC

## 2011-12-03 MED ORDER — METFORMIN HCL 1000 MG PO TABS: 1000.0000 mg | ORAL_TABLET | Freq: Two times a day (BID) | ORAL | Status: DC

## 2011-12-03 NOTE — Patient Instructions (Signed)
Limit your sodium (Salt) intake    It is important that you exercise regularly, at least 20 minutes 3 to 4 times per week.  If you develop chest pain or shortness of breath seek  medical attention.   Please check your hemoglobin A1c every 3 months  Please check your blood pressure on a regular basis.  If it is consistently greater than 150/90, please make an office appointment.  

## 2011-12-03 NOTE — Progress Notes (Signed)
Subjective:    Patient ID: Rhonda Farley, female    DOB: February 07, 1951, 61 y.o.   MRN: [id number]  HPI  61 year old patient who is seen today for followup. She has a history of type 2 diabetes. Other medical problems include hypertension and exogenous obesity. 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 This apparently is associated with high risk of cancer at other sites. She is followed closely by GI and undergoes GI surveillance every other year. She is also followed by dermatology and gynecology at least annually. She has been on Cymbalta for chronic headaches. Wellbutrin has been added to her regimen in the remote past do to a mood disorder. She has multiple somatic complaints that include headaches weakness general fatigue mood disorder concentration issues. She has chronic back and generalized discomfort. At her last visit her thyroid dose was down titrator. She is maintained nice glycemic control.  Past Medical History  Diagnosis Date  . GERD (gastroesophageal reflux disease)   . Arthritis   . Asthma   . Cancer     sebaceous cancer dx. 2008  . Depression   . Diabetes mellitus   . Chronic headaches   . Allergy   . Migraines   . Thyroid disease   . Hyperlipidemia   . Shingles   . Pneumonia   . Fibromyalgia   . Obesity   . Cancer of sebaceous glands   . Hypertension     no per patient    History   Social History  . Marital Status: Married    Spouse Name: N/A    Number of Children: N/A  . Years of Education: N/A   Occupational History  . Not on file.   Social History Main Topics  . Smoking status: Former Smoker    Quit date: 04/13/1977  . Smokeless tobacco: Never Used  . Alcohol Use: No  . Drug Use: No  . Sexually Active:    Other Topics Concern  . Not on file   Social History Narrative  . No narrative on file    Past Surgical History  Procedure Date  . Abdominal exploration surgery   .  Bunionectomy   . Cervical laminectomy 2000  . Roux-en-y procedure 2009  . Rotator cuff repair 01/2010    right  . Tonsillectomy     Family History  Problem Relation Age of Onset  . Arthritis Mother   . Cancer Mother     colon/skin  . Colon cancer Mother   . Cancer Father     lung/prostate  . Diabetes Paternal Uncle   . Colon cancer Paternal Uncle   . Arthritis Paternal Grandmother   . Heart disease Paternal Grandmother   . Arthritis Paternal Grandfather   . Cancer Paternal Grandfather     prostate  . Esophageal cancer Maternal Aunt     No Known Allergies  Current Outpatient Prescriptions on File Prior to Visit  Medication Sig Dispense Refill  . budesonide-formoterol (SYMBICORT) 80-4.5 MCG/ACT inhaler Inhale 2 puffs into the lungs as needed.  1 Inhaler  3  . buPROPion (WELLBUTRIN XL) 150 MG 24 hr tablet Take 1 tablet (150 mg total) by mouth every morning.  90 tablet  3  . butalbital-acetaminophen-caffeine (FIORICET, ESGIC) 50-325-40 MG per tablet Take 2 tablets by mouth as needed.  14 tablet  3  . cetirizine (ZYRTEC) 10 MG tablet Take 10 mg by mouth daily.        Marland Kitchen  cyanocobalamin (COBAL-1000) 1000 MCG/ML injection Inject 1,000 mcg into the muscle every 30 (thirty) days. PT may self adminster       . DULoxetine (CYMBALTA) 60 MG capsule Take 1 capsule (60 mg total) by mouth daily.  90 capsule  3  . esomeprazole (NEXIUM) 40 MG capsule Take 1 capsule (40 mg total) by mouth 2 (two) times daily.  180 capsule  3  . fish oil-omega-3 fatty acids 1000 MG capsule Take 1 g by mouth daily.        Marland Kitchen gabapentin (NEURONTIN) 300 MG capsule Take 1 capsule (300 mg total) by mouth 3 (three) times daily.  270 capsule  3  . glucosamine-chondroitin 500-400 MG tablet Take 1 tablet by mouth 3 (three) times daily.        . Insulin Pen Needle (BD PEN NEEDLE NANO U/F) 32G X 4 MM MISC 1 each by Does not apply route daily.  100 each  0  . levothyroxine (SYNTHROID, LEVOTHROID) 88 MCG tablet Take 1 tablet  (88 mcg total) by mouth daily.  90 tablet  3  . Liraglutide 18 MG/3ML SOLN Inject 0.2 mLs (1.2 mg total) into the skin daily.  9 mL  6  . LORazepam (ATIVAN) 1 MG tablet Take 1 tablet (1 mg total) by mouth at bedtime.  30 tablet  3  . metFORMIN (GLUCOPHAGE) 1000 MG tablet Take 1 tablet (1,000 mg total) by mouth 2 (two) times daily with a meal.  60 tablet  0  . montelukast (SINGULAIR) 10 MG tablet Take 1 tablet (10 mg total) by mouth at bedtime.  30 tablet  0  . Multiple Vitamin (MULTIVITAMIN) capsule Take 1 capsule by mouth daily.        . naratriptan (AMERGE) 2.5 MG tablet Take 1 tablet (2.5 mg total) by mouth as needed. Take one (1) tablet at onset of headache; if returns or does not resolve, may repeat after 4 hours; do not exceed five (5) mg in 24 hours.  10 tablet  3  . Olopatadine HCl (PATADAY) 0.2 % SOLN Apply to eye.        . progesterone (PROMETRIUM) 100 MG capsule       . simvastatin (ZOCOR) 40 MG tablet Take 1 tablet (40 mg total) by mouth at bedtime.  90 tablet  3  . Thiamine HCl (VITAMIN B-1) 100 MG tablet Take 100 mg by mouth daily.        . valACYclovir (VALTREX) 500 MG tablet Take 1 tablet (500 mg total) by mouth 2 (two) times daily.  180 tablet  3  . VIVELLE-DOT 0.0375 MG/24HR       . glipiZIDE (GLUCOTROL) 5 MG tablet Take 5 mg by mouth daily.        BP 118/70  Pulse 87  Temp 98.7 F (37.1 C) (Oral)  Wt 163 lb (73.936 kg)  SpO2 98%      Review of Systems  Constitutional: Positive for fatigue.  HENT: Positive for neck pain. Negative for hearing loss, congestion, sore throat, rhinorrhea, dental problem, sinus pressure and tinnitus.   Eyes: Negative for pain, discharge and visual disturbance.  Respiratory: Negative for cough and shortness of breath.   Cardiovascular: Negative for chest pain, palpitations and leg swelling.  Gastrointestinal: Negative for nausea, vomiting, abdominal pain, diarrhea, constipation, blood in stool and abdominal distention.  Genitourinary:  Negative for dysuria, urgency, frequency, hematuria, flank pain, vaginal bleeding, vaginal discharge, difficulty urinating, vaginal pain and pelvic pain.  Musculoskeletal: Positive for back pain  and arthralgias. Negative for joint swelling and gait problem.  Skin: Negative for rash.  Neurological: Positive for speech difficulty and weakness. Negative for dizziness, syncope, numbness and headaches.  Hematological: Negative for adenopathy.  Psychiatric/Behavioral: Positive for decreased concentration. Negative for behavioral problems, dysphoric mood and agitation. The patient is not nervous/anxious.        Objective:   Physical Exam  Constitutional: She is oriented to person, place, and time. She appears well-developed and well-nourished.  HENT:  Head: Normocephalic.  Right Ear: External ear normal.  Left Ear: External ear normal.  Mouth/Throat: Oropharynx is clear and moist.  Eyes: Conjunctivae and EOM are normal. Pupils are equal, round, and reactive to light.  Neck: Normal range of motion. Neck supple. No thyromegaly present.  Cardiovascular: Normal rate, regular rhythm, normal heart sounds and intact distal pulses.   Pulmonary/Chest: Effort normal and breath sounds normal.  Abdominal: Soft. Bowel sounds are normal. She exhibits no mass. There is no tenderness.  Musculoskeletal: Normal range of motion.  Lymphadenopathy:    She has no cervical adenopathy.  Neurological: She is alert and oriented to person, place, and time.  Skin: Skin is warm and dry. No rash noted.  Psychiatric: She has a normal mood and affect. Her behavior is normal.          Assessment & Plan:   HTN DM GERD HLD Hypothyroidism

## 2011-12-04 NOTE — Progress Notes (Signed)
Quick Note:  Called and spoke with pt and pt is aware of lab results. Pt states she is not taking any extra medication during the week. ______

## 2011-12-08 ENCOUNTER — Ambulatory Visit (INDEPENDENT_AMBULATORY_CARE_PROVIDER_SITE_OTHER): Payer: BC Managed Care – PPO | Admitting: Internal Medicine

## 2011-12-08 ENCOUNTER — Encounter: Payer: Self-pay | Admitting: Internal Medicine

## 2011-12-08 ENCOUNTER — Other Ambulatory Visit: Payer: Self-pay

## 2011-12-08 VITALS — BP 108/72 | Temp 98.1°F | Wt 163.0 lb

## 2011-12-08 DIAGNOSIS — D485 Neoplasm of uncertain behavior of skin: Secondary | ICD-10-CM

## 2011-12-08 DIAGNOSIS — M546 Pain in thoracic spine: Secondary | ICD-10-CM

## 2011-12-08 DIAGNOSIS — M129 Arthropathy, unspecified: Secondary | ICD-10-CM

## 2011-12-08 DIAGNOSIS — M25569 Pain in unspecified knee: Secondary | ICD-10-CM

## 2011-12-08 MED ORDER — LEVOTHYROXINE SODIUM 88 MCG PO TABS: 88.0000 ug | ORAL_TABLET | Freq: Every day | ORAL | Status: DC

## 2011-12-08 MED ORDER — SAXAGLIPTIN-METFORMIN ER 2.5-1000 MG PO TB24: ORAL_TABLET | ORAL | Status: DC

## 2011-12-08 MED ORDER — INSULIN PEN NEEDLE 32G X 4 MM MISC: 1.0000 | Freq: Every day | Status: DC

## 2011-12-08 MED ORDER — MONTELUKAST SODIUM 10 MG PO TABS: 10.0000 mg | ORAL_TABLET | Freq: Every day | ORAL | Status: DC

## 2011-12-08 MED ORDER — SAXAGLIPTIN-METFORMIN ER 2.5-1000 MG PO TB24: 1.0000 | ORAL_TABLET | Freq: Two times a day (BID) | ORAL | Status: DC

## 2011-12-08 MED ORDER — BUPROPION HCL ER (XL) 150 MG PO TB24: 150.0000 mg | ORAL_TABLET | ORAL | Status: DC

## 2011-12-08 MED ORDER — BUTALBITAL-APAP-CAFFEINE 50-325-40 MG PO TABS: 2.0000 | ORAL_TABLET | ORAL | Status: DC | PRN

## 2011-12-08 NOTE — Telephone Encounter (Signed)
Med ordered per pt request - [facility]

## 2011-12-08 NOTE — Progress Notes (Signed)
Subjective:    Patient ID: Rhonda Farley, female    DOB: 13-Jul-1950, 61 y.o.   MRN: [id number]  HPI  61 year old patient who is seen today for followup. She was seen last week for followup of her diabetes and hemoglobin A1c was well controlled. Today she complains of nausea. Laboratory studies were reviewed and her TSH was suppressed. Her Synthroid dose was down titrated. She is here today complaining of severe thoracic pain this has been chronic. A thoracic MRI was performed in 2010. She states that she has been evaluated by chiropractic medicine acupuncture orthopedics and has had extensive physical therapy. Medical regimen includes Cymbalta Wellbutrin and Neurontin  She also complains of knee pain and requests orthopedic referral  Past Medical History  Diagnosis Date  . GERD (gastroesophageal reflux disease)   . Arthritis   . Asthma   . Cancer     sebaceous cancer dx. 2008  . Depression   . Diabetes mellitus   . Chronic headaches   . Allergy   . Migraines   . Thyroid disease   . Hyperlipidemia   . Shingles   . Pneumonia   . Fibromyalgia   . Obesity   . Cancer of sebaceous glands   . Hypertension     no per patient    History   Social History  . Marital Status: Married    Spouse Name: N/A    Number of Children: N/A  . Years of Education: N/A   Occupational History  . Not on file.   Social History Main Topics  . Smoking status: Former Smoker    Quit date: 04/13/1977  . Smokeless tobacco: Never Used  . Alcohol Use: No  . Drug Use: No  . Sexually Active:    Other Topics Concern  . Not on file   Social History Narrative  . No narrative on file    Past Surgical History  Procedure Date  . Abdominal exploration surgery   . Bunionectomy   . Cervical laminectomy 2000  . Roux-en-y procedure 2009  . Rotator cuff repair 01/2010    right  . Tonsillectomy     Family History  Problem Relation Age of Onset  . Arthritis Mother   . Cancer Mother    colon/skin  . Colon cancer Mother   . Cancer Father     lung/prostate  . Diabetes Paternal Uncle   . Colon cancer Paternal Uncle   . Arthritis Paternal Grandmother   . Heart disease Paternal Grandmother   . Arthritis Paternal Grandfather   . Cancer Paternal Grandfather     prostate  . Esophageal cancer Maternal Aunt     No Known Allergies  Current Outpatient Prescriptions on File Prior to Visit  Medication Sig Dispense Refill  . budesonide-formoterol (SYMBICORT) 80-4.5 MCG/ACT inhaler Inhale 2 puffs into the lungs as needed.  1 Inhaler  3  . cetirizine (ZYRTEC) 10 MG tablet Take 10 mg by mouth daily.        . cyanocobalamin (COBAL-1000) 1000 MCG/ML injection Inject 1,000 mcg into the muscle every 30 (thirty) days. PT may self adminster       . DULoxetine (CYMBALTA) 60 MG capsule Take 1 capsule (60 mg total) by mouth daily.  90 capsule  3  . esomeprazole (NEXIUM) 40 MG capsule Take 1 capsule (40 mg total) by mouth 2 (two) times daily.  180 capsule  3  . fish oil-omega-3 fatty acids 1000 MG capsule Take 1 g by mouth daily.        Marland Kitchen  gabapentin (NEURONTIN) 300 MG capsule Take 1 capsule (300 mg total) by mouth 3 (three) times daily.  270 capsule  3  . Liraglutide 18 MG/3ML SOLN Inject 0.2 mLs (1.2 mg total) into the skin daily.  9 mL  6  . LORazepam (ATIVAN) 1 MG tablet Take 1 tablet (1 mg total) by mouth at bedtime.  30 tablet  3  . Multiple Vitamin (MULTIVITAMIN) capsule Take 1 capsule by mouth daily.        . naratriptan (AMERGE) 2.5 MG tablet Take 1 tablet (2.5 mg total) by mouth as needed. Take one (1) tablet at onset of headache; if returns or does not resolve, may repeat after 4 hours; do not exceed five (5) mg in 24 hours.  10 tablet  3  . Olopatadine HCl (PATADAY) 0.2 % SOLN Apply to eye.        . progesterone (PROMETRIUM) 100 MG capsule       . simvastatin (ZOCOR) 40 MG tablet Take 1 tablet (40 mg total) by mouth at bedtime.  90 tablet  3  . Thiamine HCl (VITAMIN B-1) 100 MG  tablet Take 100 mg by mouth daily.        . valACYclovir (VALTREX) 500 MG tablet Take 1 tablet (500 mg total) by mouth 2 (two) times daily.  180 tablet  3  . VIVELLE-DOT 0.0375 MG/24HR       . DISCONTD: buPROPion (WELLBUTRIN XL) 150 MG 24 hr tablet Take 1 tablet (150 mg total) by mouth every morning.  90 tablet  3  . DISCONTD: levothyroxine (SYNTHROID, LEVOTHROID) 88 MCG tablet Take 1 tablet (88 mcg total) by mouth daily.  90 tablet  3  . DISCONTD: montelukast (SINGULAIR) 10 MG tablet Take 1 tablet (10 mg total) by mouth at bedtime.  30 tablet  0    BP 108/72  Temp 98.1 F (36.7 C) (Oral)  Wt 163 lb (73.936 kg)      Review of Systems     Objective:   Physical Exam        Assessment & Plan:

## 2011-12-08 NOTE — Patient Instructions (Addendum)
Please check your hemoglobin A1c every 3 months  Orthopedic followup as discussed  Pain management referral as discussed

## 2011-12-09 DIAGNOSIS — D485 Neoplasm of uncertain behavior of skin: Secondary | ICD-10-CM | POA: Insufficient documentation

## 2011-12-16 ENCOUNTER — Other Ambulatory Visit: Payer: Self-pay | Admitting: Dermatology

## 2011-12-18 ENCOUNTER — Ambulatory Visit (INDEPENDENT_AMBULATORY_CARE_PROVIDER_SITE_OTHER): Payer: BC Managed Care – PPO | Admitting: Internal Medicine

## 2011-12-18 ENCOUNTER — Encounter: Payer: Self-pay | Admitting: Internal Medicine

## 2011-12-18 VITALS — BP 100/70 | Temp 97.9°F | Wt 162.0 lb

## 2011-12-18 DIAGNOSIS — E119 Type 2 diabetes mellitus without complications: Secondary | ICD-10-CM

## 2011-12-18 DIAGNOSIS — L03211 Cellulitis of face: Secondary | ICD-10-CM

## 2011-12-18 DIAGNOSIS — L0201 Cutaneous abscess of face: Secondary | ICD-10-CM

## 2011-12-18 NOTE — Patient Instructions (Addendum)
Cellulitis Cellulitis is an infection of the skin and the tissue beneath it. The area is typically red and tender. It is caused by germs (bacteria) (usually staph or strep) that enter the body through cuts or sores. Cellulitis most commonly occurs in the arms or lower legs.   HOME CARE INSTRUCTIONS    If you are given a prescription for medications which kill germs (antibiotics), take as directed until finished.   If the infection is on the arm or leg, keep the limb elevated as able.   Use a warm cloth several times per day to relieve pain and encourage healing.   See your caregiver for recheck of the infected site as directed if problems arise.   Only take over-the-counter or prescription medicines for pain, discomfort, or fever as directed by your caregiver.  SEEK MEDICAL CARE IF:    The area of redness (inflammation) is spreading, there are red streaks coming from the infected site, or if a part of the infection begins to turn dark in color.   The joint or bone underneath the infected skin becomes painful after the skin has healed.   The infection returns in the same or another area after it seems to have gone away.   A boil or bump swells up. This may be an abscess.   New, unexplained problems such as pain or fever develop.  SEEK IMMEDIATE MEDICAL CARE IF:    You have a fever.   You or your child feels drowsy or lethargic.   There is vomiting, diarrhea, or lasting discomfort or feeling ill (malaise) with muscle aches and pains.  MAKE SURE YOU:    Understand these instructions.   Will watch your condition.   Will get help right away if you are not doing well or get worse.  Document Released: 01/07/2005 Document Revised: 03/19/2011 Document Reviewed: 11/16/2007 [facility] Patient Information 2012 [place], [place].  Call or return to clinic prn if these symptoms worsen or fail to improve as anticipated.  Take your antibiotic as prescribed until ALL of it is gone, but stop if  you develop a rash, swelling, or any side effects of the medication.  Contact our office as soon as possible if  there are side effects of the medication.  Avelox one daily for 7 days

## 2011-12-18 NOTE — Progress Notes (Signed)
Subjective:    Patient ID: Rhonda Farley, female    DOB: [birth year], 61 y.o.   MRN: [id number]  HPI  61 year old patient who has a history of type 2 diabetes. She woke this morning with some pain and tenderness involving the right mandibular area. No history of trauma. No history of fever or other constitutional complaints denies any sore throat or URI symptoms  Past Medical History  Diagnosis Date  . GERD (gastroesophageal reflux disease)   . Arthritis   . Asthma   . Cancer     sebaceous cancer dx. 2008  . Depression   . Diabetes mellitus   . Chronic headaches   . Allergy   . Migraines   . Thyroid disease   . Hyperlipidemia   . Shingles   . Pneumonia   . Fibromyalgia   . Obesity   . Cancer of sebaceous glands   . Hypertension     no per patient    History   Social History  . Marital Status: Married    Spouse Name: N/A    Number of Children: N/A  . Years of Education: N/A   Occupational History  . Not on file.   Social History Main Topics  . Smoking status: Former Smoker    Quit date: 04/13/1977  . Smokeless tobacco: Never Used  . Alcohol Use: No  . Drug Use: No  . Sexually Active:    Other Topics Concern  . Not on file   Social History Narrative  . No narrative on file    Past Surgical History  Procedure Date  . Abdominal exploration surgery   . Bunionectomy   . Cervical laminectomy 2000  . Roux-en-y procedure 2009  . Rotator cuff repair 01/2010    right  . Tonsillectomy     Family History  Problem Relation Age of Onset  . Arthritis Mother   . Cancer Mother     colon/skin  . Colon cancer Mother   . Cancer Father     lung/prostate  . Diabetes Paternal Uncle   . Colon cancer Paternal Uncle   . Arthritis Paternal Grandmother   . Heart disease Paternal Grandmother   . Arthritis Paternal Grandfather   . Cancer Paternal Grandfather     prostate  . Esophageal cancer Maternal Aunt     No Known Allergies  Current Outpatient Prescriptions  on File Prior to Visit  Medication Sig Dispense Refill  . B Complex-C (SUPER B COMPLEX PO) Take by mouth daily.      . budesonide-formoterol (SYMBICORT) 80-4.5 MCG/ACT inhaler Inhale 2 puffs into the lungs as needed.  1 Inhaler  3  . buPROPion (WELLBUTRIN XL) 150 MG 24 hr tablet Take 1 tablet (150 mg total) by mouth every morning.  90 tablet  3  . butalbital-acetaminophen-caffeine (FIORICET, ESGIC) 50-325-40 MG per tablet Take 2 tablets by mouth as needed.  60 tablet  3  . cetirizine (ZYRTEC) 10 MG tablet Take 10 mg by mouth daily.        . Coenzyme Q10 (CO Q 10 PO) Take by mouth daily.      . cyanocobalamin (COBAL-1000) 1000 MCG/ML injection Inject 1,000 mcg into the muscle every 30 (thirty) days. PT may self adminster       . DULoxetine (CYMBALTA) 60 MG capsule Take 1 capsule (60 mg total) by mouth daily.  90 capsule  3  . esomeprazole (NEXIUM) 40 MG capsule Take 1 capsule (40 mg total) by mouth 2 (two) times  daily.  180 capsule  3  . fish oil-omega-3 fatty acids 1000 MG capsule Take 1 g by mouth daily.        Marland Kitchen gabapentin (NEURONTIN) 300 MG capsule Take 1 capsule (300 mg total) by mouth 3 (three) times daily.  270 capsule  3  . GLUCOSAMINE HCL-MSM PO Take by mouth daily.      . Insulin Pen Needle (BD PEN NEEDLE NANO U/F) 32G X 4 MM MISC 1 each by Does not apply route daily.  100 each  3  . levothyroxine (SYNTHROID, LEVOTHROID) 88 MCG tablet Take 1 tablet (88 mcg total) by mouth daily.  90 tablet  3  . Liraglutide 18 MG/3ML SOLN Inject 0.2 mLs (1.2 mg total) into the skin daily.  9 mL  6  . LORazepam (ATIVAN) 1 MG tablet Take 1 tablet (1 mg total) by mouth at bedtime.  30 tablet  3  . montelukast (SINGULAIR) 10 MG tablet Take 1 tablet (10 mg total) by mouth at bedtime.  90 tablet  3  . Multiple Vitamin (MULTIVITAMIN) capsule Take 1 capsule by mouth daily.        . naratriptan (AMERGE) 2.5 MG tablet Take 1 tablet (2.5 mg total) by mouth as needed. Take one (1) tablet at onset of headache; if  returns or does not resolve, may repeat after 4 hours; do not exceed five (5) mg in 24 hours.  10 tablet  3  . Olopatadine HCl (PATADAY) 0.2 % SOLN Apply to eye.        . progesterone (PROMETRIUM) 100 MG capsule Take daily starting the first of each month for 10days      . Saxagliptin-Metformin (KOMBIGLYZE XR) 2.08-998 MG TB24 1 tablet daily  180 tablet  3  . simvastatin (ZOCOR) 40 MG tablet Take 1 tablet (40 mg total) by mouth at bedtime.  90 tablet  3  . Thiamine HCl (VITAMIN B-1) 100 MG tablet Take 100 mg by mouth daily.        . valACYclovir (VALTREX) 500 MG tablet Take 1 tablet (500 mg total) by mouth 2 (two) times daily.  180 tablet  3  . VIVELLE-DOT 0.0375 MG/24HR Place 1 patch onto the skin 2 (two) times a week.         BP 100/70  Temp 97.9 F (36.6 C) (Oral)  Wt 162 lb (73.483 kg)       Review of Systems  Constitutional: Negative for fever, chills, diaphoresis, activity change, appetite change and fatigue.  Skin: Positive for wound.       Objective:   Physical Exam  Constitutional: She appears well-developed and well-nourished. No distress.       Afebrile  HENT:  Head: Normocephalic and atraumatic.  Right Ear: External ear normal.  Left Ear: External ear normal.  Nose: Nose normal.  Mouth/Throat: No oropharyngeal exudate.  Neck: Normal range of motion. Neck supple.       Patient had tenderness soft tissue swelling and excessive warmth involving the right mid mandibular area. There is no submandibular adenopathy  ENT unremarkable  Lymphadenopathy:    She has no cervical adenopathy.          Assessment & Plan:   Facial cellulitis right mandibular area.  We'll treat with Avelox 400 mg for 7 days. We'll call if unimproved

## 2011-12-20 IMAGING — RF DG ESOPHAGUS
19 of 21 series · 19 of 21 positions shown · non-contrast
Comparison: None.

***ADDENDUM*** CREATED: 10/23/2010 [DATE]

The patient swallowed a 12.5 mm in diameter barium tablet without
difficulty.  This passed normally through the esophagus and into
the stomach.
CLINICAL DATA: Dysphagia.  History of gastroesophageal reflux,
currently treated with medication.  Status post gastric bypass 4
years ago.  History of sebaceous cancer, increasing the risk for
esophageal and colon cancer.
ESOPHOGRAM/BARIUM SWALLOW
TECHNIQUE: Combined double contrast and single contrast
examination performed using effervescent crystals, thick barium
liquid, and thin barium liquid.
Fluoroscopy time:  2.8 minutes.

[Series 1: run · 1 of 1 slices shown (1 of 19)]
[im 1/1]
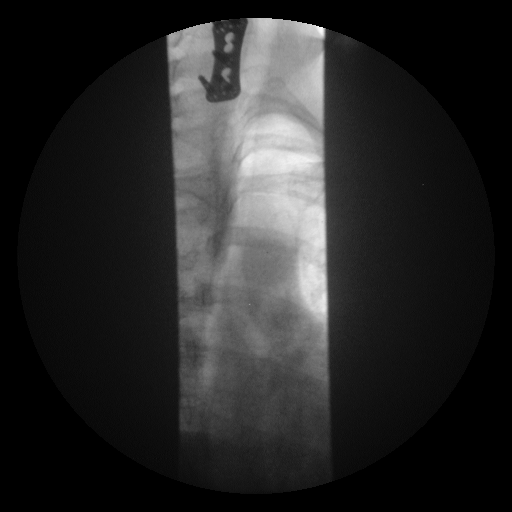

[Series 2: run · 1 of 1 slices shown (2 of 19)]
[im 1/1]
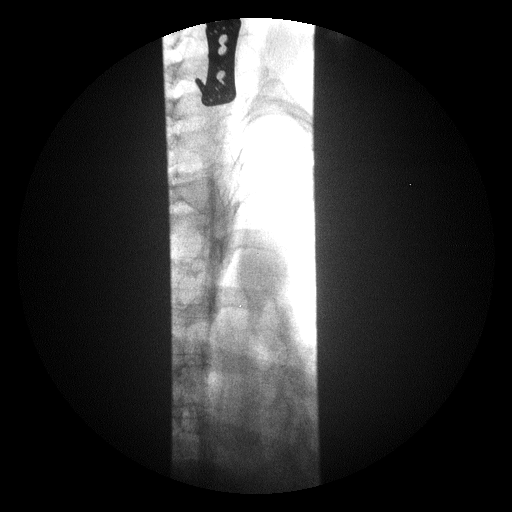

[Series 3: run · 1 of 1 slices shown (3 of 19)]
[im 1/1]
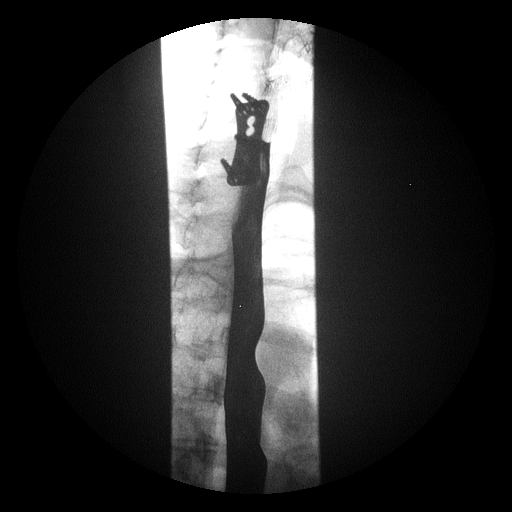

[Series 4: run · 1 of 1 slices shown (4 of 19)]
[im 1/1]
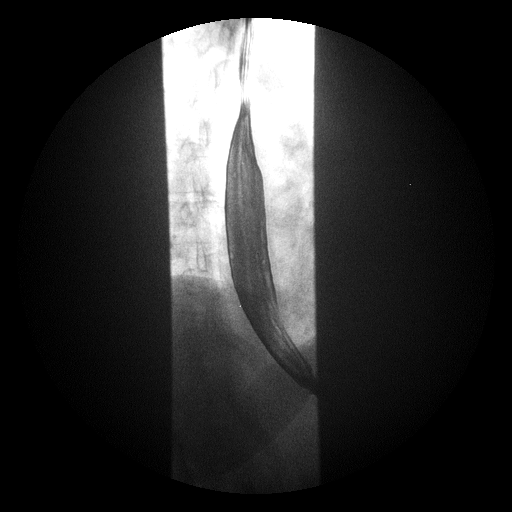

[Series 5: run · 1 of 1 slices shown (5 of 19)]
[im 1/1]
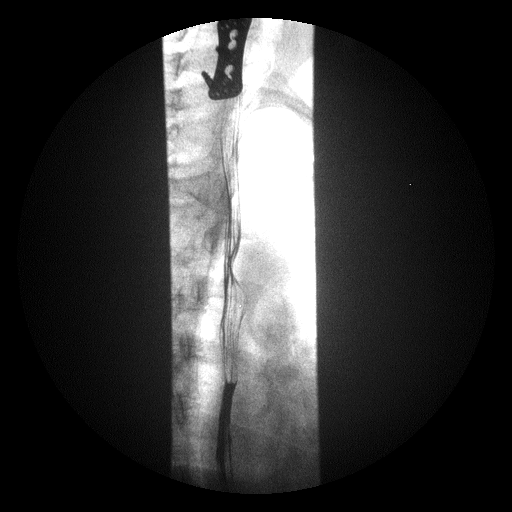

[Series 7: run · 1 of 1 slices shown (6 of 19)]
[im 1/1]
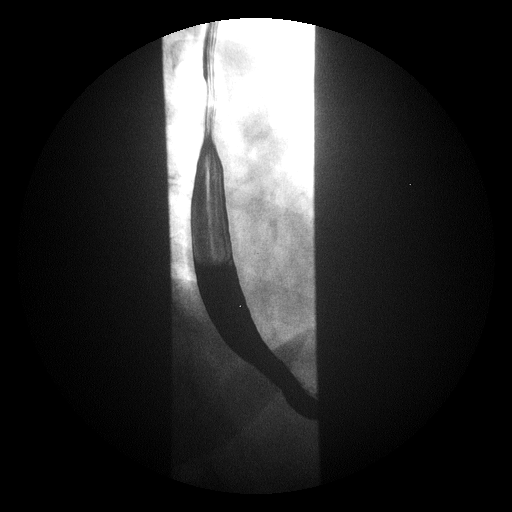

[Series 8: run · 1 of 1 slices shown (7 of 19)]
[im 1/1]
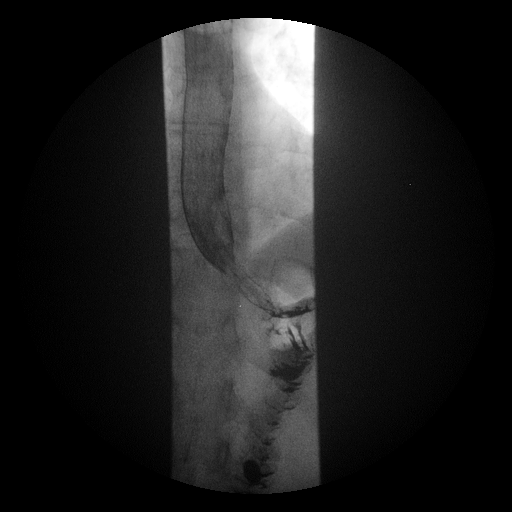

[Series 9: run · 1 of 1 slices shown (8 of 19)]
[im 1/1]
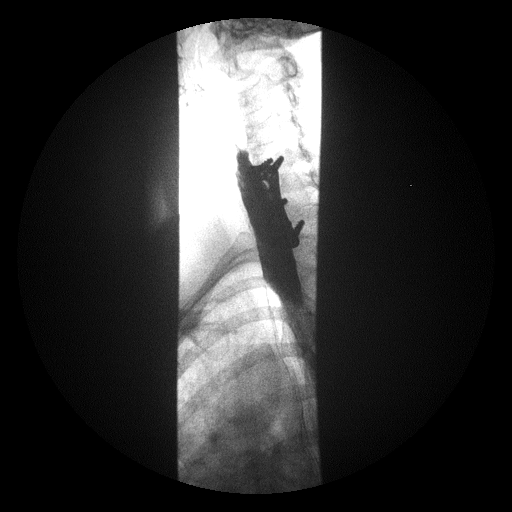

[Series 10: run · 1 of 1 slices shown (9 of 19)]
[im 1/1]
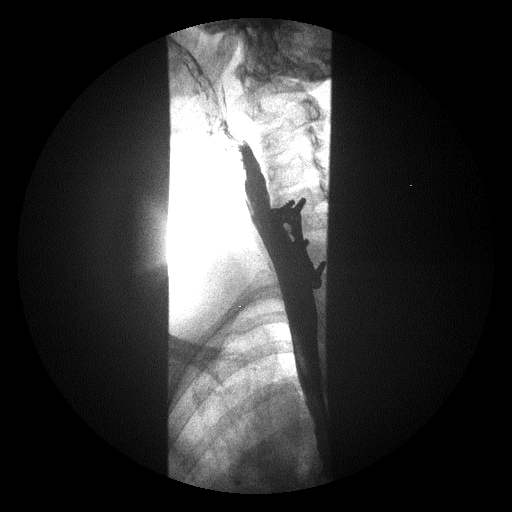

[Series 11: run · 1 of 1 slices shown (10 of 19)]
[im 1/1]
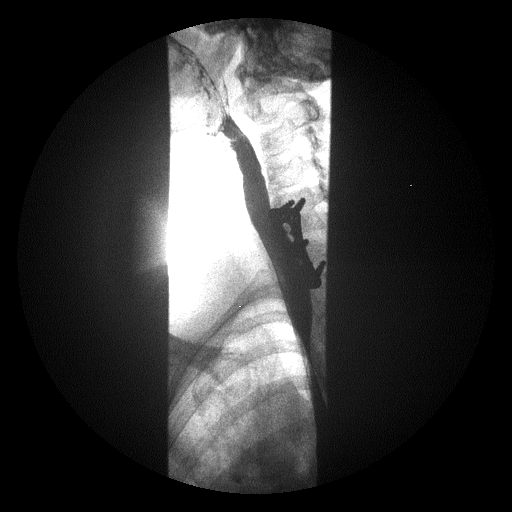

[Series 12: run · 1 of 1 slices shown (11 of 19)]
[im 1/1]
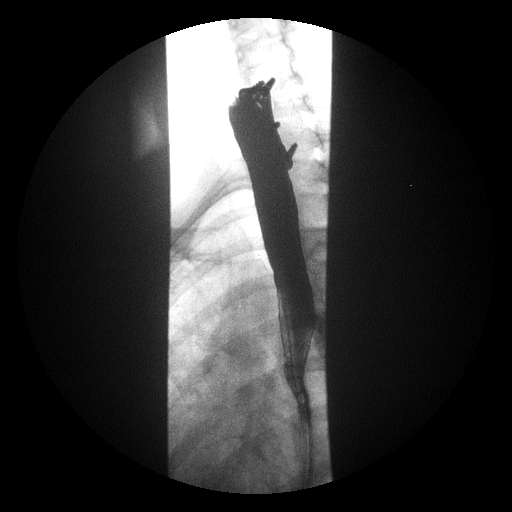

[Series 13: run · 1 of 1 slices shown (12 of 19)]
[im 1/1]
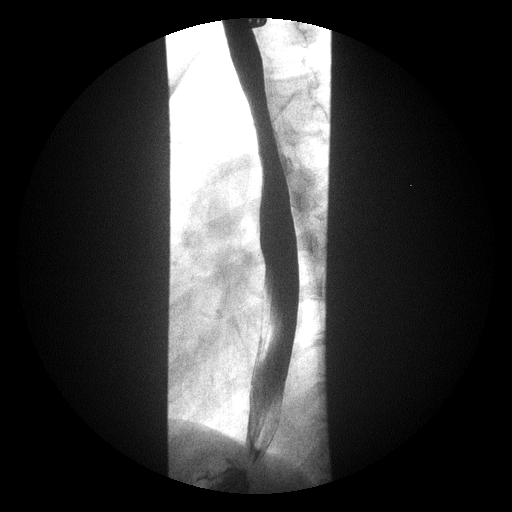

[Series 14: run · 1 of 1 slices shown (13 of 19)]
[im 1/1]
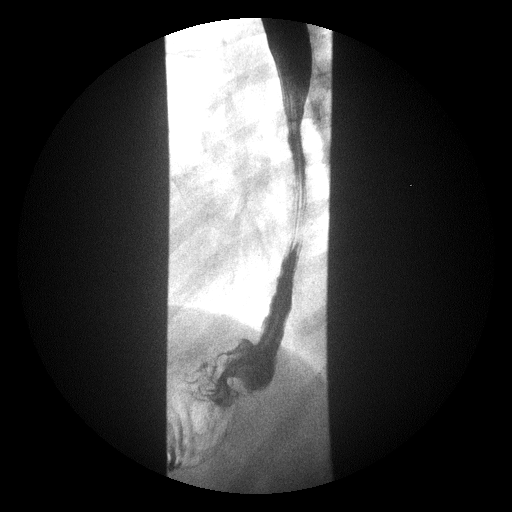

[Series 15: run · 1 of 1 slices shown (14 of 19)]
[im 1/1]
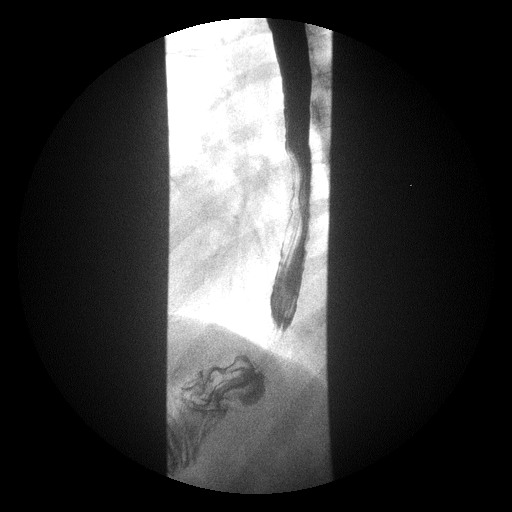

[Series 17: run · 1 of 1 slices shown (15 of 19)]
[im 1/1]
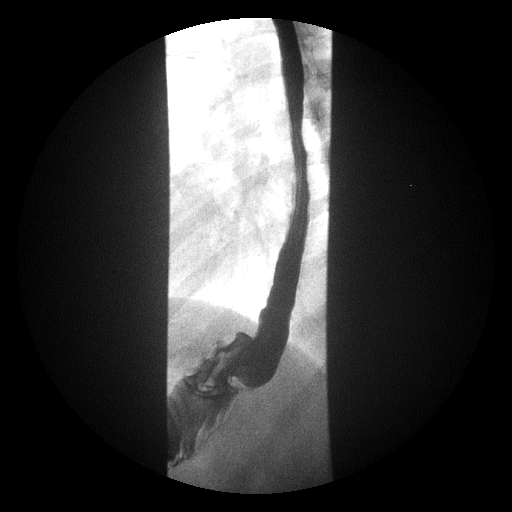

[Series 18: run · 1 of 1 slices shown (16 of 19)]
[im 1/1]
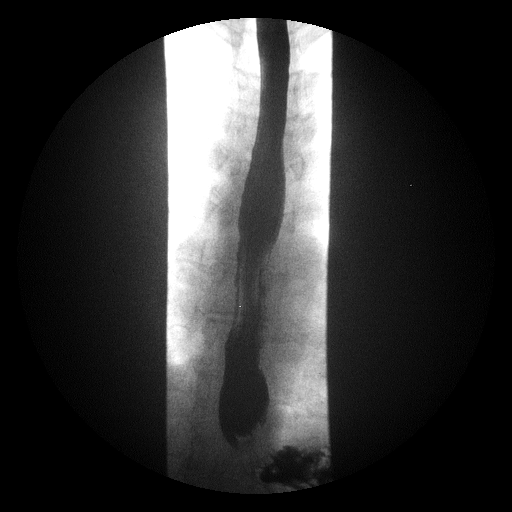

[Series 19: run · 1 of 1 slices shown (17 of 19)]
[im 1/1]
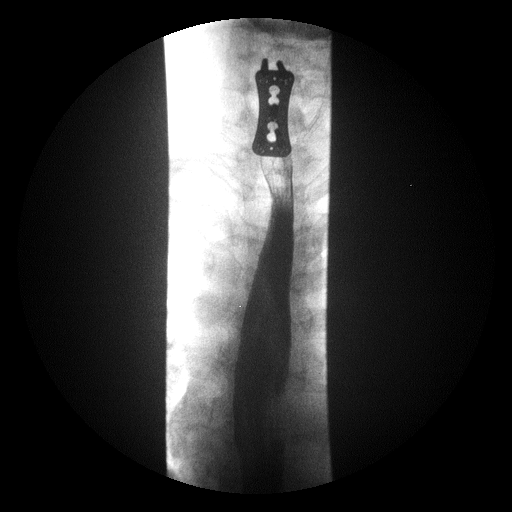

[Series 20: run · 1 of 1 slices shown (18 of 19)]
[im 1/1]
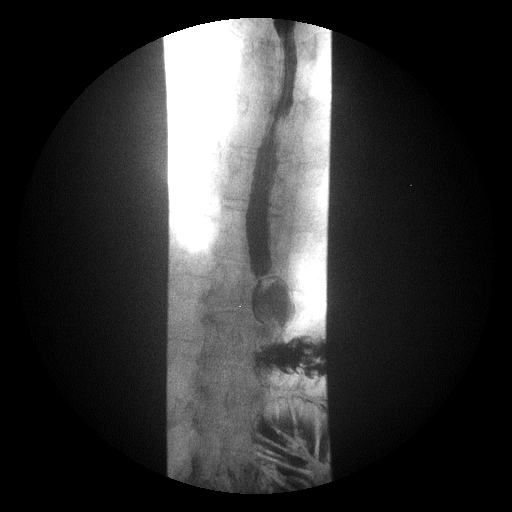

[Series 21: run · 1 of 1 slices shown (19 of 19)]
[im 1/1]
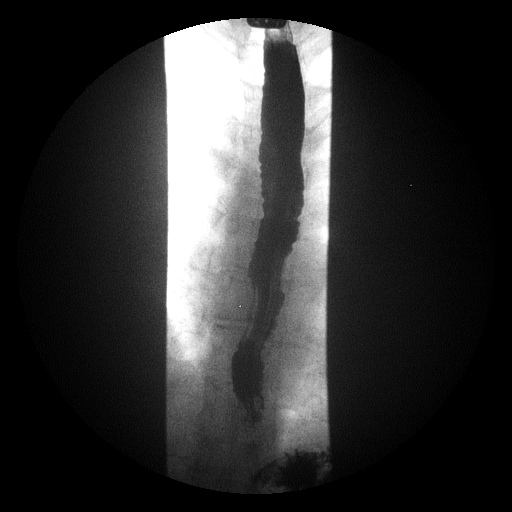

[19 of 21 positions shown; findings below may reference images not displayed]

FINDINGS: The patient swallowed barium without difficulty.  Normal
primary esophageal peristalsis with impaired secondary peristalsis
and intermittent mild tertiary peristaltic activity in the mid and
distal esophagus.  There was prolonged pooling of barium in the
esophagus in the recumbent position.  No hypopharyngeal
abnormalities, strictures, masses or ulcerations seen.  No hiatal
hernia or gastroesophageal reflux seen during examination.
Cervical spine fixation hardware is noted.
IMPRESSION: 1.  Esophageal dysmotility.
2.  No stricture or mass seen.

## 2011-12-25 ENCOUNTER — Encounter: Payer: Self-pay | Admitting: Internal Medicine

## 2011-12-25 ENCOUNTER — Ambulatory Visit (INDEPENDENT_AMBULATORY_CARE_PROVIDER_SITE_OTHER): Payer: BC Managed Care – PPO | Admitting: Internal Medicine

## 2011-12-25 ENCOUNTER — Telehealth: Payer: Self-pay | Admitting: Internal Medicine

## 2011-12-25 VITALS — BP 100/70 | Temp 97.9°F | Wt 160.0 lb

## 2011-12-25 DIAGNOSIS — M129 Arthropathy, unspecified: Secondary | ICD-10-CM

## 2011-12-25 DIAGNOSIS — M549 Dorsalgia, unspecified: Secondary | ICD-10-CM

## 2011-12-25 NOTE — Progress Notes (Signed)
Subjective:    Patient ID: Rhonda Farley, female    DOB: 11-27-1950, 61 y.o.   MRN: [id number]  HPI   60 year old patient who is seen today in followup. She was seen 7 days ago and treated with Avelox for a right facial cellulitis. She feels that there has been some persistent mild swelling and was concerned about complete resolution. She also has a history of arthritis and chronic back pain and has requested referral to chronic pain management.  Past Medical History  Diagnosis Date  . GERD (gastroesophageal reflux disease)   . Arthritis   . Asthma   . Cancer     sebaceous cancer dx. 2008  . Depression   . Diabetes mellitus   . Chronic headaches   . Allergy   . Migraines   . Thyroid disease   . Hyperlipidemia   . Shingles   . Pneumonia   . Fibromyalgia   . Obesity   . Cancer of sebaceous glands   . Hypertension     no per patient    History   Social History  . Marital Status: Married    Spouse Name: N/A    Number of Children: N/A  . Years of Education: N/A   Occupational History  . Not on file.   Social History Main Topics  . Smoking status: Former Smoker    Quit date: 04/13/1977  . Smokeless tobacco: Never Used  . Alcohol Use: No  . Drug Use: No  . Sexually Active:    Other Topics Concern  . Not on file   Social History Narrative  . No narrative on file    Past Surgical History  Procedure Date  . Abdominal exploration surgery   . Bunionectomy   . Cervical laminectomy 2000  . Roux-en-y procedure 2009  . Rotator cuff repair 01/2010    right  . Tonsillectomy     Family History  Problem Relation Age of Onset  . Arthritis Mother   . Cancer Mother     colon/skin  . Colon cancer Mother   . Cancer Father     lung/prostate  . Diabetes Paternal Uncle   . Colon cancer Paternal Uncle   . Arthritis Paternal Grandmother   . Heart disease Paternal Grandmother   . Arthritis Paternal Grandfather   . Cancer Paternal Grandfather     prostate  .  Esophageal cancer Maternal Aunt     No Known Allergies  Current Outpatient Prescriptions on File Prior to Visit  Medication Sig Dispense Refill  . B Complex-C (SUPER B COMPLEX PO) Take by mouth daily.      . budesonide-formoterol (SYMBICORT) 80-4.5 MCG/ACT inhaler Inhale 2 puffs into the lungs as needed.  1 Inhaler  3  . buPROPion (WELLBUTRIN XL) 150 MG 24 hr tablet Take 1 tablet (150 mg total) by mouth every morning.  90 tablet  3  . butalbital-acetaminophen-caffeine (FIORICET, ESGIC) 50-325-40 MG per tablet Take 2 tablets by mouth as needed.  60 tablet  3  . cetirizine (ZYRTEC) 10 MG tablet Take 10 mg by mouth daily.        . Coenzyme Q10 (CO Q 10 PO) Take by mouth daily.      . cyanocobalamin (COBAL-1000) 1000 MCG/ML injection Inject 1,000 mcg into the muscle every 30 (thirty) days. PT may self adminster       . DULoxetine (CYMBALTA) 60 MG capsule Take 1 capsule (60 mg total) by mouth daily.  90 capsule  3  .  esomeprazole (NEXIUM) 40 MG capsule Take 1 capsule (40 mg total) by mouth 2 (two) times daily.  180 capsule  3  . fish oil-omega-3 fatty acids 1000 MG capsule Take 1 g by mouth daily.        Marland Kitchen gabapentin (NEURONTIN) 300 MG capsule Take 1 capsule (300 mg total) by mouth 3 (three) times daily.  270 capsule  3  . GLUCOSAMINE HCL-MSM PO Take by mouth daily.      . Insulin Pen Needle (BD PEN NEEDLE NANO U/F) 32G X 4 MM MISC 1 each by Does not apply route daily.  100 each  3  . levothyroxine (SYNTHROID, LEVOTHROID) 88 MCG tablet Take 1 tablet (88 mcg total) by mouth daily.  90 tablet  3  . Liraglutide 18 MG/3ML SOLN Inject 0.2 mLs (1.2 mg total) into the skin daily.  9 mL  6  . LORazepam (ATIVAN) 1 MG tablet Take 1 tablet (1 mg total) by mouth at bedtime.  30 tablet  3  . montelukast (SINGULAIR) 10 MG tablet Take 1 tablet (10 mg total) by mouth at bedtime.  90 tablet  3  . Multiple Vitamin (MULTIVITAMIN) capsule Take 1 capsule by mouth daily.        . naratriptan (AMERGE) 2.5 MG tablet  Take 1 tablet (2.5 mg total) by mouth as needed. Take one (1) tablet at onset of headache; if returns or does not resolve, may repeat after 4 hours; do not exceed five (5) mg in 24 hours.  10 tablet  3  . Olopatadine HCl (PATADAY) 0.2 % SOLN Apply to eye.        . progesterone (PROMETRIUM) 100 MG capsule Take daily starting the first of each month for 10days      . Saxagliptin-Metformin (KOMBIGLYZE XR) 2.08-998 MG TB24 1 tablet daily  180 tablet  3  . simvastatin (ZOCOR) 40 MG tablet Take 1 tablet (40 mg total) by mouth at bedtime.  90 tablet  3  . Thiamine HCl (VITAMIN B-1) 100 MG tablet Take 100 mg by mouth daily.        . valACYclovir (VALTREX) 500 MG tablet Take 1 tablet (500 mg total) by mouth 2 (two) times daily.  180 tablet  3  . VIVELLE-DOT 0.0375 MG/24HR Place 1 patch onto the skin 2 (two) times a week.         BP 100/70  Temp 97.9 F (36.6 C) (Oral)  Wt 160 lb (72.576 kg)       Review of Systems     Objective:   Physical Exam  Constitutional: She appears well-developed and well-nourished. No distress.       Afebrile  Skin:       There was a suggestion of some mild residual soft tissue swelling over the right mid mandibular area. Oropharynx was clear tympanic membranes were normal there is no regional adenopathy          Assessment & Plan:   Right facial cellulitis clinically resolved. We'll continue to observe.patient will benefit from further antibiotic therapy but will call if there is any clinical worsening Chronic low back pain. We'll set up for chronic pain referral

## 2011-12-25 NOTE — Patient Instructions (Signed)
Please check your hemoglobin A1c every 3 months  Call or return to clinic prn if these symptoms worsen or fail to improve as anticipated.  

## 2011-12-25 NOTE — Telephone Encounter (Signed)
Caller and  Patient Name: Rhonda Farley; PCP: Eleonore Chiquito [facility]); Best Callback Phone Number: [phone] Seen in office last week and started on Avelox for Cellulistic Lump in R cheek/jaw. Took last dose on 12/24/11. Bump is better- pea sized today 12/25/11 but still tender touch. R ear pain started on 12/24/11. She took Tylenol  ES at 0900 12/25/11 for headache - which she gets frequently, and is not helping with aching ear and jaw tenderness. Using Warm compresses for comfort. Triage and Care advice per Neck Lump and Ear Symptoms Protocol and appnt advised within 24 hours for "pain when moving outer ear". Scheduled for today-12/25/11 @ 1430 with Dr. Amador Cunas.

## 2011-12-31 ENCOUNTER — Encounter: Payer: Self-pay | Admitting: Physical Medicine & Rehabilitation

## 2012-01-01 ENCOUNTER — Ambulatory Visit (HOSPITAL_BASED_OUTPATIENT_CLINIC_OR_DEPARTMENT_OTHER): Payer: BC Managed Care – PPO | Admitting: Physical Medicine & Rehabilitation

## 2012-01-01 ENCOUNTER — Encounter: Payer: Self-pay | Admitting: Physical Medicine & Rehabilitation

## 2012-01-01 ENCOUNTER — Encounter: Payer: BC Managed Care – PPO | Attending: Physical Medicine & Rehabilitation

## 2012-01-01 VITALS — BP 111/70 | HR 82 | Resp 16 | Ht 65.0 in | Wt 162.0 lb

## 2012-01-01 DIAGNOSIS — M7918 Myalgia, other site: Secondary | ICD-10-CM

## 2012-01-01 DIAGNOSIS — Z5181 Encounter for therapeutic drug level monitoring: Secondary | ICD-10-CM

## 2012-01-01 DIAGNOSIS — M7062 Trochanteric bursitis, left hip: Secondary | ICD-10-CM

## 2012-01-01 DIAGNOSIS — IMO0001 Reserved for inherently not codable concepts without codable children: Secondary | ICD-10-CM | POA: Insufficient documentation

## 2012-01-01 DIAGNOSIS — M76899 Other specified enthesopathies of unspecified lower limb, excluding foot: Secondary | ICD-10-CM | POA: Insufficient documentation

## 2012-01-01 DIAGNOSIS — E785 Hyperlipidemia, unspecified: Secondary | ICD-10-CM | POA: Insufficient documentation

## 2012-01-01 DIAGNOSIS — K219 Gastro-esophageal reflux disease without esophagitis: Secondary | ICD-10-CM | POA: Insufficient documentation

## 2012-01-01 DIAGNOSIS — M961 Postlaminectomy syndrome, not elsewhere classified: Secondary | ICD-10-CM | POA: Insufficient documentation

## 2012-01-01 DIAGNOSIS — Z8589 Personal history of malignant neoplasm of other organs and systems: Secondary | ICD-10-CM | POA: Insufficient documentation

## 2012-01-01 DIAGNOSIS — M7061 Trochanteric bursitis, right hip: Secondary | ICD-10-CM

## 2012-01-01 DIAGNOSIS — E119 Type 2 diabetes mellitus without complications: Secondary | ICD-10-CM | POA: Insufficient documentation

## 2012-01-01 MED ORDER — NORTRIPTYLINE HCL 10 MG PO CAPS: 10.0000 mg | ORAL_CAPSULE | Freq: Every day | ORAL | Status: DC

## 2012-01-01 NOTE — Patient Instructions (Signed)
My diagnosis is myofascial pain syndrome please see the information below We injected the Trapezius, infraspinatus, Levator scapulae with lidocaine In addition I believe you have trochanteric bursitis: Please do the exercises listed below  Trochanteric Bursitis You have hip pain due to trochanteric bursitis. Bursitis means that the sack near the outside of the hip is filled with fluid and inflamed. This sack is made up of protective soft tissue. The pain from trochanteric bursitis can be severe and keep you from sleep. It can radiate to the buttocks or down the outside of the thigh to the knee. The pain is almost always worse when rising from the seated or lying position and with walking. Pain can improve after you take a few steps. It happens more often in people with hip joint and lumbar spine problems, such as arthritis or previous surgery. Very rarely the trochanteric bursa can become infected, and antibiotics and/or surgery may be needed. Treatment often includes an injection of local anesthetic mixed with cortisone medicine. This medicine is injected into the area where it is most tender over the hip. Repeat injections may be necessary if the response to treatment is slow. You can apply ice packs over the tender area for 30 minutes every 2 hours for the next few days. Anti-inflammatory and/or narcotic pain medicine may also be helpful. Limit your activity for the next few days if the pain continues. See your caregiver in 5-10 days if you are not greatly improved.  SEEK IMMEDIATE MEDICAL CARE IF:  You develop severe pain, fever, or increased redness.   You have pain that radiates below the knee.  EXERCISES STRETCHING EXERCISES - Trochantic Bursitis  These exercises may help you when beginning to rehabilitate your injury. Your symptoms may resolve with or without further involvement from your physician, physical therapist   or athletic trainer. While completing these exercises, remember:     Restoring tissue flexibility helps normal motion to return to the joints. This allows healthier, less painful movement and activity.   An effective stretch should be held for at least 30 seconds.   A stretch should never be painful. You should only feel a gentle lengthening or release in the stretched tissue.  STRETCH - Iliotibial Band  On the floor or bed, lie on your side so your injured leg is on top. Bend your knee and grab your ankle.   Slowly bring your knee back so that your thigh is in line with your trunk. Keep your heel at your buttocks and gently arch your back so your head, shoulders and hips line up.   Slowly lower your leg so that your knee approaches the floor/bed until you feel a gentle stretch on the outside of your thigh. If you do not feel a stretch and your knee will not fall farther, place the heel of your opposite foot on top of your knee and pull your thigh down farther.   Hold this stretch for __________ seconds.   Repeat __________ times. Complete this exercise __________ times per day.  STRETCH - Hamstrings, Supine   Lie on your back. Loop a belt or towel over the ball of your foot as shown.   Straighten your knee and slowly pull on the belt to raise your injured leg. Do not allow the knee to bend. Keep your opposite leg flat on the floor.   Raise the leg until you feel a gentle stretch behind your knee or thigh. Hold this position for __________ seconds.   Repeat __________  times. Complete this stretch __________ times per day.  STRETCH - Quadriceps, Prone   Lie on your stomach on a firm surface, such as a bed or padded floor.   Bend your knee and grasp your ankle. If you are unable to reach, your ankle or pant leg, use a belt around your foot to lengthen your reach.   Gently pull your heel toward your buttocks. Your knee should not slide out to the side. You should feel a stretch in the front of your thigh and/or knee.   Hold this position for  __________ seconds.   Repeat __________ times. Complete this stretch __________ times per day.  STRETCHING - Hip Flexors, Lunge Half kneel with your knee on the floor and your opposite knee bent and directly over your ankle.  Keep good posture with your head over your shoulders. Tighten your buttocks to point your tailbone downward; this will prevent your back from arching too much.   You should feel a gentle stretch in the front of your thigh and/or hip. If you do not feel any resistance, slightly slide your opposite foot forward and then slowly lunge forward so your knee once again lines up over your ankle. Be sure your tailbone remains pointed downward.   Hold this stretch for __________ seconds.   Repeat __________ times. Complete this stretch __________ times per day.  STRETCH - Adductors, Lunge  While standing, spread your legs   Lean away from your injured leg by bending your opposite knee. You may rest your hands on your thigh for balance.   You should feel a stretch in your inner thigh. Hold for __________ seconds.   Repeat __________ times. Complete this exercise __________ times per day.  Document Released: 05/07/2004 Document Revised: 03/19/2011 Document Reviewed: 07/12/2008 [facility] Patient Information 2012 [place], [place].

## 2012-01-01 NOTE — Progress Notes (Signed)
Subjective:    Patient ID: Rhonda Farley, female    DOB: Sep 09, 1950, 61 y.o.   MRN: [id number]  HPI Chief complaint is upper back pain worse with sitting or standing. Onset was at least 4 years ago no history of trauma. Walking does not make the pain worse. 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 General activity 8 Relation with others 6 Enjoyment of life 8 What TIME of day is your pain at its worst? night Sleep (in general) Poor  Pain is worse with: standing Pain improves with: heat/ice Relief from Meds: 0  Mobility walk without assistance how many minutes can you walk? 60 ability to climb steps?  yes do you drive?  yes  Function not employed: date last employed 2011  Neuro/Psych spasms  Prior Studies bone scan x-rays CT/MRI  Physicians involved in your care Primary care  Orthopedist    Family History  Problem Relation Age of Onset  . Arthritis Mother   . Cancer Mother     colon/skin  . Colon cancer Mother   . Cancer Father     lung/prostate  . Diabetes Paternal Uncle   . Colon cancer Paternal Uncle   . Arthritis Paternal Grandmother   . Heart disease Paternal Grandmother   . Arthritis Paternal Grandfather   . Cancer Paternal Grandfather     prostate  . Esophageal cancer Maternal Aunt    History   Social History  . Marital Status: Married    Spouse Name: N/A    Number of Children: N/A  . Years of Education: N/A   Social History Main Topics  . Smoking status: Former Smoker    Quit date:  04/13/1977  . Smokeless tobacco: Never Used  . Alcohol Use: No  . Drug Use: No  . Sexually Active:    Other Topics Concern  . None   Social History Narrative  . None   Past Surgical History  Procedure Date  . Abdominal exploration surgery   . Bunionectomy   . Cervical laminectomy 2000  . Roux-en-y procedure 2009  . Rotator cuff repair 01/2010    right  . Tonsillectomy    Past Medical History  Diagnosis Date  . GERD (gastroesophageal reflux disease)   . Arthritis   . Asthma   . Cancer     sebaceous cancer dx. 2008  . Depression   . Diabetes mellitus   . Chronic headaches   . Allergy   . Migraines   . Thyroid disease   . Hyperlipidemia   . Shingles   . Pneumonia   . Fibromyalgia   . Obesity   . Cancer of sebaceous glands   . Hypertension     no per patient   BP 111/70  Pulse 82  Resp 16  Ht 5\' 5"  (1.651 m)  Wt 162 lb (73.483 kg)  BMI 26.96 kg/m2  SpO2 99%      Review of Systems  Constitutional: Positive for diaphoresis.  HENT: Negative.   Eyes: Negative.   Respiratory: Negative.   Cardiovascular: Negative.  Gastrointestinal: Negative.   Genitourinary: Negative.   Musculoskeletal: Positive for myalgias, back pain and arthralgias.  Skin: Negative.   Neurological:       Spasms  Hematological: Negative.        Objective:   Physical Exam  Constitutional: She is oriented to person, place, and time. She appears well-developed and well-nourished.  HENT:  Head: Normocephalic and atraumatic.  Musculoskeletal:       Right shoulder: Normal.       Left shoulder: Normal.       Cervical back: She exhibits decreased range of motion and tenderness. She exhibits no bony tenderness, no swelling, no deformity and no spasm.       Thoracic back: She exhibits tenderness and pain. She exhibits no edema, no deformity and no spasm.       Tenderness in bilateral trapezius muscles tenderness in bilateral levator scapula muscles, tenderness in bilateral  infraspinatus muscles  Neurological: She is alert and oriented to person, place, and time. She has normal strength and normal reflexes. No cranial nerve deficit or sensory deficit. Coordination and gait normal.  Psychiatric: She has a normal mood and affect.          Assessment & Plan:  1. Myofascial pain syndrome this appears to be her primary pain generator. I reviewed her imaging studies. Certainly nothing to suggest any significant thoracic spine issue. He has typical tenderness in muscles that are commonly affected by trigger points. She has no evidence to suggest fibromyalgia syndrome at this point. I will do trigger point injections today Have instructed in some home exercise program using theraband exercises  2. Bilateral trochanteric bursitis. I've instructed her with exercises and handouts. If she does not respond to this, will do trochanteric bursa injections next month. She recently had knee injections.  3. Cervical postlaminectomy syndrome this does not appear to be playing into her Other pain complaints She can continue her gabapentin for now however I am not sure whether she actually needs to be on this on chronic basis I've added nortriptyline this should help with her myofascial pain, her sleep as well as overall chronic pain.

## 2012-01-28 ENCOUNTER — Ambulatory Visit: Payer: BC Managed Care – PPO | Admitting: Physical Medicine & Rehabilitation

## 2012-02-08 ENCOUNTER — Encounter: Payer: BC Managed Care – PPO | Attending: Physical Medicine & Rehabilitation

## 2012-02-08 ENCOUNTER — Ambulatory Visit (HOSPITAL_BASED_OUTPATIENT_CLINIC_OR_DEPARTMENT_OTHER): Payer: BC Managed Care – PPO | Admitting: Physical Medicine & Rehabilitation

## 2012-02-08 ENCOUNTER — Encounter: Payer: Self-pay | Admitting: Physical Medicine & Rehabilitation

## 2012-02-08 VITALS — BP 100/66 | HR 96 | Resp 14 | Ht 65.0 in | Wt 161.0 lb

## 2012-02-08 DIAGNOSIS — E785 Hyperlipidemia, unspecified: Secondary | ICD-10-CM | POA: Insufficient documentation

## 2012-02-08 DIAGNOSIS — IMO0001 Reserved for inherently not codable concepts without codable children: Secondary | ICD-10-CM

## 2012-02-08 DIAGNOSIS — M546 Pain in thoracic spine: Secondary | ICD-10-CM

## 2012-02-08 DIAGNOSIS — K219 Gastro-esophageal reflux disease without esophagitis: Secondary | ICD-10-CM | POA: Insufficient documentation

## 2012-02-08 DIAGNOSIS — M961 Postlaminectomy syndrome, not elsewhere classified: Secondary | ICD-10-CM | POA: Insufficient documentation

## 2012-02-08 DIAGNOSIS — M791 Myalgia, unspecified site: Secondary | ICD-10-CM

## 2012-02-08 DIAGNOSIS — M76899 Other specified enthesopathies of unspecified lower limb, excluding foot: Secondary | ICD-10-CM | POA: Insufficient documentation

## 2012-02-08 DIAGNOSIS — Z8589 Personal history of malignant neoplasm of other organs and systems: Secondary | ICD-10-CM | POA: Insufficient documentation

## 2012-02-08 DIAGNOSIS — E119 Type 2 diabetes mellitus without complications: Secondary | ICD-10-CM | POA: Insufficient documentation

## 2012-02-08 MED ORDER — METHOCARBAMOL 500 MG PO TABS: 500.0000 mg | ORAL_TABLET | Freq: Three times a day (TID) | ORAL | Status: DC

## 2012-02-08 NOTE — Patient Instructions (Signed)
Continue with your walking program, continue with exercises and posture training.

## 2012-02-08 NOTE — Progress Notes (Addendum)
Subjective:    Patient ID: Rhonda Farley, female    DOB: 1950-05-09, 61 y.o.   MRN: [id number]  HPI The patient is a 61 year old female, who presents with upper back pain . The symptoms started over 5 years ago. The patient complains about moderate pain. Patient denies any radiating symptoms.  She describes the pain as hot burning cramping, spasm  . Applying heat and ice, taking medications , changing positions alleviate the symptoms. Prolonged sitting and standing aggrevates the symptoms. The patient grades his pain as a 4 /10. The patient also complains about LBP. She has a Hx of a pars defect. Pain Inventory Average Pain 7 Pain Right Now 4 My pain is burning and aching  In the last 24 hours, has pain interfered with the following? General activity 8 Relation with others 8 Enjoyment of life 8 What TIME of day is your pain at its worst? night Sleep (in general) Poor  Pain is worse with: standing Pain improves with: rest and heat/ice Relief from Meds: no medication  Mobility walk without assistance how many minutes can you walk? 60 ability to climb steps?  yes do you drive?  yes  Function not employed: date last employed  I need assistance with the following:  meal prep, household duties and shopping  Neuro/Psych dizziness  Prior Studies Any changes since last visit?  no  Physicians involved in your care Any changes since last visit?  no   Family History  Problem Relation Age of Onset  . Arthritis Mother   . Cancer Mother     colon/skin  . Colon cancer Mother   . Cancer Father     lung/prostate  . Diabetes Paternal Uncle   . Colon cancer Paternal Uncle   . Arthritis Paternal Grandmother   . Heart disease Paternal Grandmother   . Arthritis Paternal Grandfather   . Cancer Paternal Grandfather     prostate  . Esophageal cancer Maternal Aunt    History   Social History  . Marital Status: Married    Spouse Name: N/A    Number of Children: N/A  . Years of  Education: N/A   Social History Main Topics  . Smoking status: Former Smoker    Quit date: 04/13/1977  . Smokeless tobacco: Never Used  . Alcohol Use: No  . Drug Use: No  . Sexually Active:    Other Topics Concern  . None   Social History Narrative  . None   Past Surgical History  Procedure Date  . Abdominal exploration surgery   . Bunionectomy   . Cervical laminectomy 2000  . Roux-en-y procedure 2009  . Rotator cuff repair 01/2010    right  . Tonsillectomy    Past Medical History  Diagnosis Date  . GERD (gastroesophageal reflux disease)   . Arthritis   . Asthma   . Cancer     sebaceous cancer dx. 2008  . Depression   . Diabetes mellitus   . Chronic headaches   . Allergy   . Migraines   . Thyroid disease   . Hyperlipidemia   . Shingles   . Pneumonia   . Fibromyalgia   . Obesity   . Cancer of sebaceous glands   . Hypertension     no per patient   BP 100/66  Pulse 96  Resp 14  Ht 5\' 5"  (1.651 m)  Wt 161 lb (73.029 kg)  BMI 26.79 kg/m2  SpO2 96%    Review of Systems  Gastrointestinal: Positive for nausea.  Musculoskeletal: Positive for back pain.  Neurological: Positive for dizziness.  All other systems reviewed and are negative.       Objective:   Physical Exam  Constitutional: She is oriented to person, place, and time. She appears well-developed and well-nourished.  HENT:  Head: Normocephalic.  Neck: Neck supple.  Musculoskeletal: She exhibits tenderness.  Neurological: She is alert and oriented to person, place, and time.  Skin: Skin is warm and dry.  Psychiatric: She has a normal mood and affect.    Symmetric normal motor tone is noted throughout. Normal muscle bulk. Muscle testing reveals 5/5 muscle strength of the upper extremity, and 5/5 of the lower extremity. Full range of motion in upper and lower extremities. ROM of spine is restricted. Fine motor movements are normal in both hands. Sensory is intact and symmetric to light  touch, pinprick and proprioception. DTR in the upper and lower extremity are present and symmetric 2+. No clonus is noted.  Patient arises from chair without difficulty. Narrow based gait with normal arm swing bilateral , able to walk on heels and toes . Tandem walk is stable. No pronator drift. Rhomberg negative. T-spine kyphotic , shoulders protracted C-spine hyperlordosis, head protracted       Assessment & Plan:  1. Myofascial pain syndrome this appears to be her primary pain generator. I reviewed her imaging studies. Certainly nothing to suggest any significant thoracic spine issue. She has tenderness in levator scapulae. Trapezius ascendens and decendens, and rhomboids bilatera,  typical tenderness/ increased tone, in muscles that are commonly affected by her bad posture. She has no evidence to suggest fibromyalgia syndrome at this point.  Showed her exercises for posture training, showed her some tricks to correct her posture, also exercises for strengthening the muscles responsible for a good posture. Prescribed Robaxin, and recommended that her husband massages the tense muscles with a tennis ball.  2. Bilateral trochanteric bursitis. I've instructed her with exercises and handouts. If she does not respond to this, will do trochanteric bursa injections next month. She recently had knee injections. Those are not bothering her today. 3. Cervical postlaminectomy syndrome this does not appear to be playing into her other pain complaints. She can continue her gabapentin for now however I am not sure whether she actually needs to be on this on chronic basis  I've added nortriptyline this should help with her myofascial pain, her sleep as well as overall chronic pain. Follow up in about 4-6 weeks.

## 2012-02-09 DIAGNOSIS — M961 Postlaminectomy syndrome, not elsewhere classified: Secondary | ICD-10-CM | POA: Insufficient documentation

## 2012-02-09 DIAGNOSIS — IMO0001 Reserved for inherently not codable concepts without codable children: Secondary | ICD-10-CM | POA: Insufficient documentation

## 2012-03-18 ENCOUNTER — Encounter: Payer: Self-pay | Admitting: Internal Medicine

## 2012-03-18 ENCOUNTER — Ambulatory Visit (INDEPENDENT_AMBULATORY_CARE_PROVIDER_SITE_OTHER): Payer: BC Managed Care – PPO | Admitting: Internal Medicine

## 2012-03-18 VITALS — BP 110/76 | HR 88 | Temp 98.2°F | Resp 18 | Wt 162.0 lb

## 2012-03-18 DIAGNOSIS — Z23 Encounter for immunization: Secondary | ICD-10-CM

## 2012-03-18 DIAGNOSIS — E119 Type 2 diabetes mellitus without complications: Secondary | ICD-10-CM

## 2012-03-18 DIAGNOSIS — I10 Essential (primary) hypertension: Secondary | ICD-10-CM

## 2012-03-18 LAB — HEMOGLOBIN A1C: Hgb A1c MFr Bld: 7.1 % — ABNORMAL HIGH (ref 4.6–6.5)

## 2012-03-18 MED ORDER — SAXAGLIPTIN-METFORMIN ER 2.5-1000 MG PO TB24: ORAL_TABLET | ORAL | Status: DC

## 2012-03-18 MED ORDER — GLIPIZIDE ER 5 MG PO TB24: 5.0000 mg | ORAL_TABLET | Freq: Every day | ORAL | Status: DC

## 2012-03-18 MED ORDER — CANAGLIFLOZIN 300 MG PO TABS: 1.0000 | ORAL_TABLET | ORAL | Status: DC

## 2012-03-18 MED ORDER — LIRAGLUTIDE 18 MG/3ML ~~LOC~~ SOLN: SUBCUTANEOUS | Status: DC

## 2012-03-18 NOTE — Progress Notes (Signed)
Subjective:    Patient ID: Rhonda Farley, female    DOB: Aug 04, 1950, 61 y.o.   MRN: [id number]  HPI  61 year old patient who is seen today for followup of diabetes. 3 months ago hemoglobin A1c was 6.6 and blood sugars were nicely controlled over the past 3 weeks she has noted significant hyperglycemia with blood sugars consistently over 200 and occasionally much higher. There's been no change in her diet. She has even borrowed a short acting insulin and taken this occasionally do to elevated blood sugar concerns. Today in general she feels well. She is on a submaximal dose of Victoza  and kombiglyze  but may not be able to tolerate higher dosages do to GI upset  Past Medical History  Diagnosis Date  . GERD (gastroesophageal reflux disease)   . Arthritis   . Asthma   . Cancer     sebaceous cancer dx. 2008  . Depression   . Diabetes mellitus   . Chronic headaches   . Allergy   . Migraines   . Thyroid disease   . Hyperlipidemia   . Shingles   . Pneumonia   . Fibromyalgia   . Obesity   . Cancer of sebaceous glands   . Hypertension     no per patient    History   Social History  . Marital Status: Married    Spouse Name: N/A    Number of Children: N/A  . Years of Education: N/A   Occupational History  . Not on file.   Social History Main Topics  . Smoking status: Former Smoker    Quit date: 04/13/1977  . Smokeless tobacco: Never Used  . Alcohol Use: No  . Drug Use: No  . Sexually Active:    Other Topics Concern  . Not on file   Social History Narrative  . No narrative on file    Past Surgical History  Procedure Date  . Abdominal exploration surgery   . Bunionectomy   . Cervical laminectomy 2000  . Roux-en-y procedure 2009  . Rotator cuff repair 01/2010    right  . Tonsillectomy     Family History  Problem Relation Age of Onset  . Arthritis Mother   . Cancer Mother     colon/skin  . Colon cancer Mother   . Cancer Father     lung/prostate  . Diabetes  Paternal Uncle   . Colon cancer Paternal Uncle   . Arthritis Paternal Grandmother   . Heart disease Paternal Grandmother   . Arthritis Paternal Grandfather   . Cancer Paternal Grandfather     prostate  . Esophageal cancer Maternal Aunt     No Known Allergies  Current Outpatient Prescriptions on File Prior to Visit  Medication Sig Dispense Refill  . B Complex-C (SUPER B COMPLEX PO) Take by mouth daily.      . budesonide-formoterol (SYMBICORT) 80-4.5 MCG/ACT inhaler Inhale 2 puffs into the lungs as needed.  1 Inhaler  3  . buPROPion (WELLBUTRIN XL) 150 MG 24 hr tablet Take 1 tablet (150 mg total) by mouth every morning.  90 tablet  3  . butalbital-acetaminophen-caffeine (FIORICET, ESGIC) 50-325-40 MG per tablet Take 2 tablets by mouth as needed.  60 tablet  3  . cetirizine (ZYRTEC) 10 MG tablet Take 10 mg by mouth daily.        . Coenzyme Q10 (CO Q 10 PO) Take by mouth daily.      . cyanocobalamin (COBAL-1000) 1000 MCG/ML injection Inject 1,000  mcg into the muscle every 30 (thirty) days. PT may self adminster       . DULoxetine (CYMBALTA) 60 MG capsule Take 1 capsule (60 mg total) by mouth daily.  90 capsule  3  . esomeprazole (NEXIUM) 40 MG capsule Take 1 capsule (40 mg total) by mouth 2 (two) times daily.  180 capsule  3  . fish oil-omega-3 fatty acids 1000 MG capsule Take 1 g by mouth daily.        Marland Kitchen gabapentin (NEURONTIN) 300 MG capsule Take 1 capsule (300 mg total) by mouth 3 (three) times daily.  270 capsule  3  . GLUCOSAMINE HCL-MSM PO Take by mouth daily.      . Insulin Pen Needle (BD PEN NEEDLE NANO U/F) 32G X 4 MM MISC 1 each by Does not apply route daily.  100 each  3  . levothyroxine (SYNTHROID, LEVOTHROID) 88 MCG tablet Take 1 tablet (88 mcg total) by mouth daily.  90 tablet  3  . Liraglutide 18 MG/3ML SOLN Inject 0.2 mLs (1.2 mg total) into the skin daily.  9 mL  6  . LORazepam (ATIVAN) 1 MG tablet Take 1 tablet (1 mg total) by mouth at bedtime.  30 tablet  3  .  methocarbamol (ROBAXIN) 500 MG tablet Take 1 tablet (500 mg total) by mouth 3 (three) times daily.  270 tablet  3  . montelukast (SINGULAIR) 10 MG tablet Take 1 tablet (10 mg total) by mouth at bedtime.  90 tablet  3  . Multiple Vitamin (MULTIVITAMIN) capsule Take 1 capsule by mouth daily.        . naratriptan (AMERGE) 2.5 MG tablet Take 1 tablet (2.5 mg total) by mouth as needed. Take one (1) tablet at onset of headache; if returns or does not resolve, may repeat after 4 hours; do not exceed five (5) mg in 24 hours.  10 tablet  3  . nortriptyline (PAMELOR) 10 MG capsule Take 1 capsule (10 mg total) by mouth at bedtime.  30 capsule  1  . Olopatadine HCl (PATADAY) 0.2 % SOLN Apply to eye.        . progesterone (PROMETRIUM) 100 MG capsule Take daily starting the first of each month for 10days      . Saxagliptin-Metformin (KOMBIGLYZE XR) 2.08-998 MG TB24 1 tablet daily  180 tablet  3  . simvastatin (ZOCOR) 40 MG tablet Take 1 tablet (40 mg total) by mouth at bedtime.  90 tablet  3  . Thiamine HCl (VITAMIN B-1) 100 MG tablet Take 100 mg by mouth daily.        . valACYclovir (VALTREX) 500 MG tablet Take 1 tablet (500 mg total) by mouth 2 (two) times daily.  180 tablet  3  . VIVELLE-DOT 0.0375 MG/24HR Place 1 patch onto the skin 2 (two) times a week.         BP 110/76  Pulse 88  Temp 98.2 F (36.8 C) (Oral)  Resp 18  Wt 162 lb (73.483 kg)  SpO2 97%       Review of Systems  Constitutional: Negative.   HENT: Negative for hearing loss, congestion, sore throat, rhinorrhea, dental problem, sinus pressure and tinnitus.   Eyes: Negative for pain, discharge and visual disturbance.  Respiratory: Negative for cough and shortness of breath.   Cardiovascular: Negative for chest pain, palpitations and leg swelling.  Gastrointestinal: Negative for nausea, vomiting, abdominal pain, diarrhea, constipation, blood in stool and abdominal distention.  Genitourinary: Negative for dysuria, urgency,  frequency,  hematuria, flank pain, vaginal bleeding, vaginal discharge, difficulty urinating, vaginal pain and pelvic pain.  Musculoskeletal: Negative for joint swelling, arthralgias and gait problem.  Skin: Negative for rash.  Neurological: Negative for dizziness, syncope, speech difficulty, weakness, numbness and headaches.  Hematological: Negative for adenopathy.  Psychiatric/Behavioral: Negative for behavioral problems, dysphoric mood and agitation. The patient is not nervous/anxious.        Objective:   Physical Exam  Constitutional: She is oriented to person, place, and time. She appears well-developed and well-nourished.  HENT:  Head: Normocephalic.  Right Ear: External ear normal.  Left Ear: External ear normal.  Mouth/Throat: Oropharynx is clear and moist.  Eyes: Conjunctivae normal and EOM are normal. Pupils are equal, round, and reactive to light.  Neck: Normal range of motion. Neck supple. No thyromegaly present.  Cardiovascular: Normal rate, regular rhythm, normal heart sounds and intact distal pulses.   Pulmonary/Chest: Effort normal and breath sounds normal.  Abdominal: Soft. Bowel sounds are normal. She exhibits no mass. There is no tenderness.  Musculoskeletal: Normal range of motion.  Lymphadenopathy:    She has no cervical adenopathy.  Neurological: She is alert and oriented to person, place, and time.  Skin: Skin is warm and dry. No rash noted.  Psychiatric: She has a normal mood and affect. Her behavior is normal.          Assessment & Plan:   Diabetes mellitus. Has been uncontrolled for the past 3 weeks. Will maximize present medication and add Glucotrol XL 5 and recheck in 3 weeks. Will check a hemoglobin A1c today Hypertension stable

## 2012-03-18 NOTE — Patient Instructions (Signed)
It is important that you exercise regularly, at least 20 minutes 3 to 4 times per week.  If you develop chest pain or shortness of breath seek  medical attention.  Recheck in 3 weeks

## 2012-03-22 ENCOUNTER — Encounter
Payer: BC Managed Care – PPO | Attending: Physical Medicine and Rehabilitation | Admitting: Physical Medicine and Rehabilitation

## 2012-03-22 ENCOUNTER — Encounter: Payer: Self-pay | Admitting: Physical Medicine and Rehabilitation

## 2012-03-22 VITALS — BP 124/79 | HR 107 | Resp 14 | Ht 65.0 in | Wt 156.0 lb

## 2012-03-22 DIAGNOSIS — R51 Headache: Secondary | ICD-10-CM

## 2012-03-22 DIAGNOSIS — M76899 Other specified enthesopathies of unspecified lower limb, excluding foot: Secondary | ICD-10-CM | POA: Insufficient documentation

## 2012-03-22 DIAGNOSIS — IMO0001 Reserved for inherently not codable concepts without codable children: Secondary | ICD-10-CM | POA: Insufficient documentation

## 2012-03-22 DIAGNOSIS — R519 Headache, unspecified: Secondary | ICD-10-CM

## 2012-03-22 DIAGNOSIS — M961 Postlaminectomy syndrome, not elsewhere classified: Secondary | ICD-10-CM

## 2012-03-22 MED ORDER — DICLOFENAC SODIUM 1 % TD GEL: 2.0000 g | Freq: Four times a day (QID) | TRANSDERMAL | Status: AC

## 2012-03-22 NOTE — Progress Notes (Signed)
Subjective:    Patient ID: Rhonda Farley, female    DOB: [birth year], 61 y.o.   MRN: [id number]  HPI The patient is a 61 year old female, who presents with neck and low back pain . The symptoms started over 5 years ago. The patient complains about moderate pain. Patient denies any radiating symptoms. She describes the pain as hot burning cramping, spasm . Applying heat and ice, taking medications , changing positions alleviate the symptoms. Prolonged sitting and standing aggrevates the symptoms. The patient grades his pain as a 4 /10. The patient also complains about LBP. She has a Hx of a pars defect. Hx of ACDF C5-7. She also complains about headaches every morning, and numbness in both of her for arms , depending on her position.  Pain Inventory Average Pain 8 Pain Right Now 6 My pain is burning, dull, tingling and aching  In the last 24 hours, has pain interfered with the following? General activity 8 Relation with others 8 Enjoyment of life 8 What TIME of day is your pain at its worst? evening Sleep (in general) Poor  Pain is worse with: bending, sitting and standing Pain improves with: rest and heat/ice Relief from Meds: 3  Mobility walk without assistance how many minutes can you walk? 60  Function not employed: date last employed   Neuro/Psych weakness numbness tingling  Prior Studies Any changes since last visit?  no  Physicians involved in your care Any changes since last visit?  no   Family History  Problem Relation Age of Onset  . Arthritis Mother   . Cancer Mother     colon/skin  . Colon cancer Mother   . Cancer Father     lung/prostate  . Diabetes Paternal Uncle   . Colon cancer Paternal Uncle   . Arthritis Paternal Grandmother   . Heart disease Paternal Grandmother   . Arthritis Paternal Grandfather   . Cancer Paternal Grandfather     prostate  . Esophageal cancer Maternal Aunt    History   Social History  . Marital Status: Married     Spouse Name: N/A    Number of Children: N/A  . Years of Education: N/A   Social History Main Topics  . Smoking status: Former Smoker    Quit date: 04/13/1977  . Smokeless tobacco: Never Used  . Alcohol Use: No  . Drug Use: No  . Sexually Active:    Other Topics Concern  . None   Social History Narrative  . None   Past Surgical History  Procedure Date  . Abdominal exploration surgery   . Bunionectomy   . Cervical laminectomy 2000  . Roux-en-y procedure 2009  . Rotator cuff repair 01/2010    right  . Tonsillectomy    Past Medical History  Diagnosis Date  . GERD (gastroesophageal reflux disease)   . Arthritis   . Asthma   . Cancer     sebaceous cancer dx. 2008  . Depression   . Diabetes mellitus   . Chronic headaches   . Allergy   . Migraines   . Thyroid disease   . Hyperlipidemia   . Shingles   . Pneumonia   . Fibromyalgia   . Obesity   . Cancer of sebaceous glands   . Hypertension     no per patient   BP 124/79  Pulse 107  Resp 14  Ht 5\' 5"  (1.651 m)  Wt 156 lb (70.761 kg)  BMI 25.96 kg/m2  SpO2  97%     Review of Systems  Musculoskeletal: Positive for back pain.  Neurological: Positive for weakness and numbness.  All other systems reviewed and are negative.       Objective:   Physical Exam Constitutional: She is oriented to person, place, and time. She appears well-developed and well-nourished.  HENT:  Head: Normocephalic.  Neck: Neck supple.  Musculoskeletal: She exhibits tenderness.  Neurological: She is alert and oriented to person, place, and time.  Skin: Skin is warm and dry.  Psychiatric: She has a normal mood and affect.   Symmetric normal motor tone is noted throughout. Normal muscle bulk. Muscle testing reveals 5/5 muscle strength of the upper extremity, and 5/5 of the lower extremity. Full range of motion in upper and lower extremities. ROM of spine is mildly restricted. Fine motor movements are normal in both hands.  Sensory  is intact and symmetric to light touch, pinprick and proprioception.  DTR in the upper and lower extremity are present and symmetric 2+. No clonus is noted.  Patient arises from chair without difficulty. Narrow based gait with normal arm swing bilateral , able to walk on heels and toes . Tandem walk is stable. No pronator drift. Rhomberg negative.  T-spine kyphotic , shoulders protracted  C-spine hyperlordosis, head protracted        Assessment & Plan:  1. Myofascial pain syndrome . She has no evidence to suggest fibromyalgia syndrome at this point.  Showed her exercises for posture training, showed her some tricks to correct her posture, also exercises for strengthening the muscles responsible for a good posture.   2. Bilateral trochanteric bursitis.  Those are not bothering her today.  3. Cervical postlaminectomy syndrome, with occipital headaches, every morning, she has tried several different pillows with no success, she takes tylenol every morning, she might have developed a rebound headache. She has a Hx of migraines, I referred her to a neurologist. She has seen Dr. Danielle Dess about 6 month ago, he told her that her ACDF/cervical spine looks fine .   She can increase her gabapentin at night to help with her headaches and numbness of her fore arms in the morning.She would take 1 tablet in am and mid day and 2 of her 300mg  at night. Prescribed Voltaren gel for exacerbation/inflammation of cervical spondylosis, she can not take oral NSAIDs because of gastric bypass surgery.  She d/c the  Nortriptyline, it did not  Help her. Follow up in about 2 month.

## 2012-03-22 NOTE — Patient Instructions (Addendum)
Continue with walking and meditating. Continue with massage. You can increase your gabapentin to 1 tablet in the morning, 1 mid day, and 2 at night.

## 2012-03-29 ENCOUNTER — Telehealth: Payer: Self-pay

## 2012-03-29 NOTE — Telephone Encounter (Signed)
Volteran  gel approved

## 2012-03-29 NOTE — Telephone Encounter (Signed)
[facility] called to follow up on prior authorization on volteran gel. [facility] [phone]

## 2012-04-08 ENCOUNTER — Ambulatory Visit (INDEPENDENT_AMBULATORY_CARE_PROVIDER_SITE_OTHER): Payer: BC Managed Care – PPO | Admitting: Internal Medicine

## 2012-04-08 ENCOUNTER — Encounter: Payer: Self-pay | Admitting: Internal Medicine

## 2012-04-08 VITALS — BP 116/80 | HR 100 | Temp 98.0°F | Resp 18 | Wt 152.0 lb

## 2012-04-08 DIAGNOSIS — I10 Essential (primary) hypertension: Secondary | ICD-10-CM

## 2012-04-08 DIAGNOSIS — E119 Type 2 diabetes mellitus without complications: Secondary | ICD-10-CM

## 2012-04-08 MED ORDER — EXENATIDE ER 2 MG ~~LOC~~ SUSR: 2.0000 mg | SUBCUTANEOUS | Status: DC

## 2012-04-08 MED ORDER — SAXAGLIPTIN-METFORMIN ER 5-1000 MG PO TB24: 1.0000 | ORAL_TABLET | Freq: Every day | ORAL | Status: DC

## 2012-04-08 NOTE — Patient Instructions (Signed)
Discontinue glipizide   Please check your hemoglobin A1c every 3 months

## 2012-04-08 NOTE — Progress Notes (Signed)
  Subjective:    Patient ID: Rhonda Farley, female    DOB: July 08, 1950, 61 y.o.   MRN: [id number]  HPI  61 year old patient who is seen today for followup of her diabetes. She recently has been placed on glipizide which has resulted in symptomatic hypoglycemia. She has also been unable to tolerate full dose metformin due to diarrhea. Last hemoglobin A1c had increased to 7.1 from 6.6.   Review of Systems     Objective:   Physical Exam        Assessment & Plan:    Diabetes mellitus with some symptomatic hypoglycemia. Glipizide will be discontinued; Bydurion  will be substituted for Victoza

## 2012-04-19 ENCOUNTER — Other Ambulatory Visit: Payer: Self-pay | Admitting: Internal Medicine

## 2012-04-19 NOTE — Telephone Encounter (Signed)
Pt has been getting DULoxetine (CYMBALTA) 60 MG capsule from [facility], but meds have been delayed. Pharm Olegario Messier) request script for DULoxetine (CYMBALTA) 60 MG capsule #30 to get her through until it gets here. [facility] and [facility] are [facility] to [facility] it [facility], but she says you can't count on that!! Thank you! [facility] [place]

## 2012-04-20 MED ORDER — DULOXETINE HCL 60 MG PO CPEP: 60.0000 mg | ORAL_CAPSULE | Freq: Every day | ORAL | Status: DC

## 2012-04-20 NOTE — Telephone Encounter (Signed)
Pt notified Rx for Cymbaltal 60 mg, 30 tablets sent to pharmacy.

## 2012-04-28 ENCOUNTER — Other Ambulatory Visit: Payer: Self-pay | Admitting: Internal Medicine

## 2012-04-28 MED ORDER — BUPROPION HCL ER (XL) 150 MG PO TB24: 150.0000 mg | ORAL_TABLET | ORAL | Status: DC

## 2012-04-28 MED ORDER — CYANOCOBALAMIN 1000 MCG/ML IJ SOLN: 1000.0000 ug | INTRAMUSCULAR | Status: DC

## 2012-04-28 MED ORDER — DULOXETINE HCL 60 MG PO CPEP: 60.0000 mg | ORAL_CAPSULE | Freq: Every day | ORAL | Status: DC

## 2012-04-28 MED ORDER — ESOMEPRAZOLE MAGNESIUM 40 MG PO CPDR: 40.0000 mg | DELAYED_RELEASE_CAPSULE | Freq: Two times a day (BID) | ORAL | Status: DC

## 2012-04-28 MED ORDER — VALACYCLOVIR HCL 500 MG PO TABS: 500.0000 mg | ORAL_TABLET | Freq: Two times a day (BID) | ORAL | Status: DC

## 2012-04-28 MED ORDER — CANAGLIFLOZIN 300 MG PO TABS: 1.0000 | ORAL_TABLET | ORAL | Status: DC

## 2012-04-28 MED ORDER — SIMVASTATIN 40 MG PO TABS: 40.0000 mg | ORAL_TABLET | Freq: Every day | ORAL | Status: DC

## 2012-04-28 MED ORDER — BUTALBITAL-APAP-CAFFEINE 50-325-40 MG PO TABS: 2.0000 | ORAL_TABLET | ORAL | Status: DC | PRN

## 2012-04-28 NOTE — Telephone Encounter (Signed)
Pt needs thes med refills sent to : [facility] fax [phone]  valACYclovir (VALTREX) 500 MG tablet DULoxetine (CYMBALTA) 60 MG capsule Canagliflozin (INVOKANA) 300 MG TABS simvastatin (ZOCOR) 40 MG tablet buPROPion (WELLBUTRIN XL) 150 MG 24 hr tablet butalbital-acetaminophen-caffeine (FIORICET, ESGIC) 50-325-40 MG per tablet cyanocobalamin (COBAL-1000) 1000 MCG/ML injection esomeprazole (NEXIUM) 40 MG capsule  [facility]... But they need to be RESENT to [facility]. 90 day supply

## 2012-04-28 NOTE — Telephone Encounter (Signed)
Pt notified Rx refill requests were sent to [facility].

## 2012-05-04 ENCOUNTER — Ambulatory Visit: Payer: BC Managed Care – PPO | Admitting: Internal Medicine

## 2012-05-10 ENCOUNTER — Other Ambulatory Visit: Payer: Self-pay | Admitting: Neurology

## 2012-05-10 DIAGNOSIS — R519 Headache, unspecified: Secondary | ICD-10-CM

## 2012-05-10 DIAGNOSIS — Z9884 Bariatric surgery status: Secondary | ICD-10-CM

## 2012-05-10 DIAGNOSIS — F329 Major depressive disorder, single episode, unspecified: Secondary | ICD-10-CM

## 2012-05-10 DIAGNOSIS — E119 Type 2 diabetes mellitus without complications: Secondary | ICD-10-CM

## 2012-05-10 DIAGNOSIS — F3289 Other specified depressive episodes: Secondary | ICD-10-CM

## 2012-05-16 ENCOUNTER — Ambulatory Visit
Admission: RE | Admit: 2012-05-16 | Discharge: 2012-05-16 | Disposition: A | Discharge status: Home / Self Care | Payer: BC Managed Care – PPO | Source: Ambulatory Visit | Attending: Neurology | Admitting: Neurology

## 2012-05-16 DIAGNOSIS — F329 Major depressive disorder, single episode, unspecified: Secondary | ICD-10-CM

## 2012-05-16 DIAGNOSIS — F3289 Other specified depressive episodes: Secondary | ICD-10-CM

## 2012-05-16 DIAGNOSIS — Z9884 Bariatric surgery status: Secondary | ICD-10-CM

## 2012-05-16 DIAGNOSIS — E119 Type 2 diabetes mellitus without complications: Secondary | ICD-10-CM

## 2012-05-16 DIAGNOSIS — R519 Headache, unspecified: Secondary | ICD-10-CM

## 2012-05-23 ENCOUNTER — Encounter: Payer: Self-pay | Admitting: Physical Medicine and Rehabilitation

## 2012-05-23 ENCOUNTER — Encounter
Payer: BC Managed Care – PPO | Attending: Physical Medicine and Rehabilitation | Admitting: Physical Medicine and Rehabilitation

## 2012-05-23 VITALS — BP 111/64 | HR 117 | Resp 14 | Ht 65.0 in | Wt 150.0 lb

## 2012-05-23 DIAGNOSIS — M961 Postlaminectomy syndrome, not elsewhere classified: Secondary | ICD-10-CM | POA: Insufficient documentation

## 2012-05-23 DIAGNOSIS — IMO0001 Reserved for inherently not codable concepts without codable children: Secondary | ICD-10-CM | POA: Insufficient documentation

## 2012-05-23 DIAGNOSIS — M545 Low back pain, unspecified: Secondary | ICD-10-CM

## 2012-05-23 DIAGNOSIS — R51 Headache: Secondary | ICD-10-CM

## 2012-05-23 DIAGNOSIS — M76899 Other specified enthesopathies of unspecified lower limb, excluding foot: Secondary | ICD-10-CM | POA: Insufficient documentation

## 2012-05-23 DIAGNOSIS — M542 Cervicalgia: Secondary | ICD-10-CM | POA: Insufficient documentation

## 2012-05-23 NOTE — Patient Instructions (Signed)
Take 600mg  of the gabapentin in the morning, 300mg  at mid day, and 600mg  at bed time. Also try to do some exercises to strengthen your abdominal muscles regularly. Also look into aquatic exercises.

## 2012-05-23 NOTE — Progress Notes (Signed)
Subjective:    Patient ID: Rhonda Farley, female    DOB: 02/27/1951, 62 y.o.   MRN: [id number]  HPI The patient is a 62 year old female, who presents with neck and low back pain . The symptoms started over 5 years ago. The patient complains about moderate pain. Patient denies any radiating symptoms. She describes the pain as hot burning cramping, spasm . Applying heat and ice, taking medications , changing positions alleviate the symptoms. Prolonged sitting and standing aggrevates the symptoms. The patient grades his pain as a 4 /10. The patient also complains about LBP. 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 Hx of ACDF C5-7. She also complains about headaches every morning, and numbness in both of her for arms , depending on her position.  Pain Inventory Average Pain 7 Pain Right Now 7 My pain is constant, burning, tingling and aching  In the last 24 hours, has pain interfered with the following? General activity 8 Relation with others 9 Enjoyment of life 9 What TIME of day is your pain at its worst? evening Sleep (in general) Poor  Pain is worse with: sitting and standing Pain improves with: heat/ice Relief from Meds: 6  Mobility how many minutes can you walk? 60 ability to climb steps?  yes do you drive?  yes  Function not employed: date last employed unknown  Neuro/Psych numbness tremor tingling dizziness  Prior Studies Any changes since last visit?  no  Physicians involved in your care Any changes since last visit?  no Dr Amador Cunas   Family History  Problem Relation Age of Onset  . Arthritis Mother   . Cancer Mother     colon/skin  . Colon cancer Mother   . Cancer Father     lung/prostate  . Diabetes Paternal Uncle   . Colon cancer Paternal Uncle   . Arthritis Paternal Grandmother   . Heart disease Paternal Grandmother   . Arthritis Paternal Grandfather   . Cancer Paternal Grandfather     prostate  . Esophageal cancer Maternal Aunt     History   Social History  . Marital Status: Married    Spouse Name: N/A    Number of Children: N/A  . Years of Education: N/A   Social History Main Topics  . Smoking status: Former Smoker    Quit date: 04/13/1977  . Smokeless tobacco: Never Used  . Alcohol Use: No  . Drug Use: No  . Sexually Active:    Other Topics Concern  . None   Social History Narrative  . None   Past Surgical History  Procedure Laterality Date  . Abdominal exploration surgery    . Bunionectomy    . Cervical laminectomy  2000  . Roux-en-y procedure  2009  . Rotator cuff repair  01/2010    right  . Tonsillectomy     Past Medical History  Diagnosis Date  . GERD (gastroesophageal reflux disease)   . Arthritis   . Asthma   . Cancer     sebaceous cancer dx. 2008  . Depression   . Diabetes mellitus   . Chronic headaches   . Allergy   . Migraines   . Thyroid disease   . Hyperlipidemia   . Shingles   . Pneumonia   . Fibromyalgia   . Obesity   . Cancer of sebaceous glands   . Hypertension     no per patient   BP 111/64  Pulse 117  Resp 14  Ht 5\' 5"  (1.651 m)  Wt 150 lb (68.04 kg)  BMI 24.96 kg/m2  SpO2 97%     Review of Systems  HENT: Positive for neck pain.   Musculoskeletal: Positive for back pain.  Neurological: Positive for dizziness, weakness and numbness.  All other systems reviewed and are negative.       Objective:   Physical Exam Constitutional: She is oriented to person, place, and time. She appears well-developed and well-nourished.  HENT:  Head: Normocephalic.  Neck: Neck supple.  Musculoskeletal: She exhibits tenderness.  Neurological: She is alert and oriented to person, place, and time.  Skin: Skin is warm and dry.  Psychiatric: She has a normal mood and affect.  Symmetric normal motor tone is noted throughout. Normal muscle bulk. Muscle testing reveals 5/5 muscle strength of the upper extremity, and 5/5 of the lower extremity. Full range of motion in  upper and lower extremities. ROM of spine is mildly restricted. Fine motor movements are normal in both hands.  Sensory is intact and symmetric to light touch, pinprick and proprioception.  DTR in the upper and lower extremity are present and symmetric 2+. No clonus is noted.  Patient arises from chair without difficulty. Narrow based gait with normal arm swing bilateral , able to walk on heels and toes . Tandem walk is stable. No pronator drift. Rhomberg negative.  T-spine kyphotic , shoulders protracted  C-spine hyperlordosis, head protracted        Assessment & Plan:  1. Myofascial pain syndrome . She has no evidence to suggest fibromyalgia syndrome at this point.  Showed her exercises for posture training, showed her some tricks to correct her posture, also exercises for strengthening the muscles responsible for a good posture.  2. Bilateral trochanteric bursitis. Those are not bothering her today.  3. Cervical postlaminectomy syndrome, with occipital headaches, every morning, she has tried several different pillows with no success, she takes tylenol every morning, she might have developed a rebound headache. She has a Hx of migraines, I referred her to a neurologist, she has seen Dr. Terrace Arabia, and was not very happy with that visit, because she felt the doctor was not listening to her. A MRI of the brain was ordered, results pending.. She has seen Dr. Danielle Dess about 6 month ago, he told her that her ACDF/cervical spine looks fine . She can increase her gabapentin at night to help with her headaches and numbness of her fore arms in the morning.She would take 2 tablet in am and 1 tablet mid day and 2 of her 300mg  at night. Prescribed Voltaren gel for exacerbation/inflammation of cervical spondylosis, she can not take oral NSAIDs because of gastric bypass surgery.  She d/c the Nortriptyline, it did not Help her. Advised patient to look into aquatic exercises. Follow up in about 2 month.

## 2012-05-25 ENCOUNTER — Encounter: Payer: Self-pay | Admitting: Internal Medicine

## 2012-05-25 ENCOUNTER — Ambulatory Visit (INDEPENDENT_AMBULATORY_CARE_PROVIDER_SITE_OTHER): Payer: BC Managed Care – PPO | Admitting: Internal Medicine

## 2012-05-25 VITALS — BP 102/70 | HR 115 | Temp 98.5°F | Resp 18 | Wt 149.0 lb

## 2012-05-25 DIAGNOSIS — E119 Type 2 diabetes mellitus without complications: Secondary | ICD-10-CM

## 2012-05-25 DIAGNOSIS — I10 Essential (primary) hypertension: Secondary | ICD-10-CM

## 2012-05-25 DIAGNOSIS — E785 Hyperlipidemia, unspecified: Secondary | ICD-10-CM

## 2012-05-25 MED ORDER — SIMVASTATIN 40 MG PO TABS: 20.0000 mg | ORAL_TABLET | Freq: Every day | ORAL | Status: DC

## 2012-05-25 MED ORDER — EXENATIDE ER 2 MG ~~LOC~~ SUSR: 2.0000 mg | SUBCUTANEOUS | Status: DC

## 2012-05-25 NOTE — Patient Instructions (Signed)
Please check your hemoglobin A1c every 3 months    It is important that you exercise regularly, at least 20 minutes 3 to 4 times per week.  If you develop chest pain or shortness of breath seek  medical attention.   

## 2012-05-25 NOTE — Progress Notes (Signed)
Subjective:    Patient ID: Rhonda Farley, female    DOB: 1950/05/16, 62 y.o.   MRN: [id number]  HPI  62 year old patient who is seen today for followup. She has type 2 diabetes which has been better controlled on her present regimen. She is followed by chronic pain management who have recommended a dose reduction in simvastatin if feasible. She has been on 40 mg daily with the very well controlled lipid profile. We'll decrease to 20 mg daily. She has hypothyroidism treated hypertension. In general doing quite well today. He  Past Medical History  Diagnosis Date  . GERD (gastroesophageal reflux disease)   . Arthritis   . Asthma   . Cancer     sebaceous cancer dx. 2008  . Depression   . Diabetes mellitus   . Chronic headaches   . Allergy   . Migraines   . Thyroid disease   . Hyperlipidemia   . Shingles   . Pneumonia   . Fibromyalgia   . Obesity   . Cancer of sebaceous glands   . Hypertension     no per patient    History   Social History  . Marital Status: Married    Spouse Name: N/A    Number of Children: N/A  . Years of Education: N/A   Occupational History  . Not on file.   Social History Main Topics  . Smoking status: Former Smoker    Quit date: 04/13/1977  . Smokeless tobacco: Never Used  . Alcohol Use: No  . Drug Use: No  . Sexually Active:    Other Topics Concern  . Not on file   Social History Narrative  . No narrative on file    Past Surgical History  Procedure Laterality Date  . Abdominal exploration surgery    . Bunionectomy    . Cervical laminectomy  2000  . Roux-en-y procedure  2009  . Rotator cuff repair  01/2010    right  . Tonsillectomy      Family History  Problem Relation Age of Onset  . Arthritis Mother   . Cancer Mother     colon/skin  . Colon cancer Mother   . Cancer Father     lung/prostate  . Diabetes Paternal Uncle   . Colon cancer Paternal Uncle   . Arthritis Paternal Grandmother   . Heart disease Paternal  Grandmother   . Arthritis Paternal Grandfather   . Cancer Paternal Grandfather     prostate  . Esophageal cancer Maternal Aunt     No Known Allergies  Current Outpatient Prescriptions on File Prior to Visit  Medication Sig Dispense Refill  . B Complex-C (SUPER B COMPLEX PO) Take by mouth daily.      . budesonide-formoterol (SYMBICORT) 80-4.5 MCG/ACT inhaler Inhale 2 puffs into the lungs as needed.  1 Inhaler  3  . buPROPion (WELLBUTRIN XL) 150 MG 24 hr tablet Take 1 tablet (150 mg total) by mouth every morning.  90 tablet  3  . butalbital-acetaminophen-caffeine (FIORICET, ESGIC) 50-325-40 MG per tablet Take 2 tablets by mouth as needed.  90 tablet  3  . Canagliflozin (INVOKANA) 300 MG TABS Take 1 tablet by mouth 1 day or 1 dose.  90 tablet  3  . cetirizine (ZYRTEC) 10 MG tablet Take 10 mg by mouth daily.        . Coenzyme Q10 (CO Q 10 PO) Take by mouth daily.      . cyanocobalamin (COBAL-1000) 1000 MCG/ML injection Inject  1 mL (1,000 mcg total) into the muscle every 30 (thirty) days. PT may self adminster  10 mL  1  . diclofenac sodium (VOLTAREN) 1 % GEL Apply 2 g topically 4 (four) times daily.  2 Tube  2  . DULoxetine (CYMBALTA) 60 MG capsule Take 1 capsule (60 mg total) by mouth daily.  90 capsule  3  . esomeprazole (NEXIUM) 40 MG capsule Take 1 capsule (40 mg total) by mouth 2 (two) times daily.  180 capsule  3  . estradiol (VIVELLE-DOT) 0.05 MG/24HR Place 1 patch onto the skin once a week.      . fish oil-omega-3 fatty acids 1000 MG capsule Take 1 g by mouth daily.        Marland Kitchen GLUCOSAMINE HCL-MSM PO Take by mouth daily.      . Insulin Pen Needle (BD PEN NEEDLE NANO U/F) 32G X 4 MM MISC 1 each by Does not apply route daily.  100 each  3  . levothyroxine (SYNTHROID, LEVOTHROID) 88 MCG tablet Take 1 tablet (88 mcg total) by mouth daily.  90 tablet  3  . LORazepam (ATIVAN) 1 MG tablet Take 1 tablet (1 mg total) by mouth at bedtime.  30 tablet  3  . methocarbamol (ROBAXIN) 500 MG tablet Take  1 tablet (500 mg total) by mouth 3 (three) times daily.  270 tablet  3  . montelukast (SINGULAIR) 10 MG tablet Take 1 tablet (10 mg total) by mouth at bedtime.  90 tablet  3  . Multiple Vitamin (MULTIVITAMIN) capsule Take 1 capsule by mouth daily.        . naratriptan (AMERGE) 2.5 MG tablet Take 1 tablet (2.5 mg total) by mouth as needed. Take one (1) tablet at onset of headache; if returns or does not resolve, may repeat after 4 hours; do not exceed five (5) mg in 24 hours.  10 tablet  3  . Olopatadine HCl (PATADAY) 0.2 % SOLN Apply to eye.        . progesterone (PROMETRIUM) 100 MG capsule Take daily starting the first of each month for 10days      . Saxagliptin-Metformin 08-998 MG TB24 Take 1 tablet by mouth daily.  90 tablet  6  . Thiamine HCl (VITAMIN B-1) 100 MG tablet Take 100 mg by mouth daily.        . valACYclovir (VALTREX) 500 MG tablet Take 1 tablet (500 mg total) by mouth 2 (two) times daily.  180 tablet  3  . VIVELLE-DOT 0.0375 MG/24HR Place 1 patch onto the skin 2 (two) times a week.        No current facility-administered medications on file prior to visit.    BP 102/70  Pulse 115  Temp(Src) 98.5 F (36.9 C) (Oral)  Resp 18  Wt 149 lb (67.586 kg)  BMI 24.79 kg/m2  SpO2 98%      Review of Systems  Constitutional: Negative.   HENT: Negative for hearing loss, congestion, sore throat, rhinorrhea, dental problem, sinus pressure and tinnitus.   Eyes: Negative for pain, discharge and visual disturbance.  Respiratory: Negative for cough and shortness of breath.   Cardiovascular: Negative for chest pain, palpitations and leg swelling.  Gastrointestinal: Negative for nausea, vomiting, abdominal pain, diarrhea, constipation, blood in stool and abdominal distention.  Genitourinary: Negative for dysuria, urgency, frequency, hematuria, flank pain, vaginal bleeding, vaginal discharge, difficulty urinating, vaginal pain and pelvic pain.  Musculoskeletal: Negative for joint swelling,  arthralgias and gait problem.  Skin: Negative for  rash.  Neurological: Negative for dizziness, syncope, speech difficulty, weakness, numbness and headaches.  Hematological: Negative for adenopathy.  Psychiatric/Behavioral: Negative for behavioral problems, dysphoric mood and agitation. The patient is not nervous/anxious.        Objective:   Physical Exam  Constitutional: She is oriented to person, place, and time. She appears well-developed and well-nourished.  HENT:  Head: Normocephalic.  Right Ear: External ear normal.  Left Ear: External ear normal.  Mouth/Throat: Oropharynx is clear and moist.  Eyes: Conjunctivae and EOM are normal. Pupils are equal, round, and reactive to light.  Neck: Normal range of motion. Neck supple. No thyromegaly present.  Cardiovascular: Normal rate, regular rhythm, normal heart sounds and intact distal pulses.   Pulmonary/Chest: Effort normal and breath sounds normal.  Abdominal: Soft. Bowel sounds are normal. She exhibits no mass. There is no tenderness.  Musculoskeletal: Normal range of motion.  Lymphadenopathy:    She has no cervical adenopathy.  Neurological: She is alert and oriented to person, place, and time.  Skin: Skin is warm and dry. No rash noted.  Psychiatric: She has a normal mood and affect. Her behavior is normal.          Assessment & Plan:   DM2-  Controlled  Will cont present Rx and check Hgha1c next rov HTN

## 2012-07-21 ENCOUNTER — Encounter
Payer: BC Managed Care – PPO | Attending: Physical Medicine and Rehabilitation | Admitting: Physical Medicine and Rehabilitation

## 2012-07-21 ENCOUNTER — Encounter: Payer: Self-pay | Admitting: Physical Medicine and Rehabilitation

## 2012-07-21 VITALS — BP 118/61 | HR 107 | Resp 14 | Ht 65.0 in | Wt 144.0 lb

## 2012-07-21 DIAGNOSIS — G8929 Other chronic pain: Secondary | ICD-10-CM | POA: Insufficient documentation

## 2012-07-21 DIAGNOSIS — M545 Low back pain, unspecified: Secondary | ICD-10-CM | POA: Insufficient documentation

## 2012-07-21 DIAGNOSIS — M549 Dorsalgia, unspecified: Secondary | ICD-10-CM | POA: Insufficient documentation

## 2012-07-21 NOTE — Progress Notes (Signed)
Subjective:    Patient ID: Rhonda Farley, female    DOB: [birth year], 62 y.o.   MRN: [id number]  HPI The patient is a 62 year old female, who presents with neck and low back pain . The symptoms started over 5 years ago. The patient complains about moderate pain. Patient denies any radiating symptoms. She describes the pain as hot burning cramping, spasm . Applying heat and ice, taking medications , changing positions alleviate the symptoms. Prolonged sitting and standing aggrevates the symptoms. The patient grades his pain as a 4 /10. The patient also complains about LBP. 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 Hx of ACDF C5-7. She also complains about headaches every morning, and numbness in both of her for arms , depending on her position.  Pain Inventory Average Pain 8 Pain Right Now 2 My pain is burning and aching  In the last 24 hours, has pain interfered with the following? General activity 8 Relation with others 8 Enjoyment of life 8 What TIME of day is your pain at its worst? evening Sleep (in general) Poor  Pain is worse with: sitting and standing Pain improves with: heat/ice Relief from Meds: 4  Mobility walk without assistance how many minutes can you walk? 60 ability to climb steps?  yes do you drive?  yes  Function not employed: date last employed .  Neuro/Psych No problems in this area  Prior Studies Any changes since last visit?  no  Physicians involved in your care Any changes since last visit?  no   Family History  Problem Relation Age of Onset  . Arthritis Mother   . Cancer Mother     colon/skin  . Colon cancer Mother   . Cancer Father     lung/prostate  . Diabetes Paternal Uncle   . Colon cancer Paternal Uncle   . Arthritis Paternal Grandmother   . Heart disease Paternal Grandmother   . Arthritis Paternal Grandfather   . Cancer Paternal Grandfather     prostate  . Esophageal cancer Maternal Aunt    History   Social History  .  Marital Status: Married    Spouse Name: N/A    Number of Children: N/A  . Years of Education: N/A   Social History Main Topics  . Smoking status: Former Smoker    Quit date: 04/13/1977  . Smokeless tobacco: Never Used  . Alcohol Use: No  . Drug Use: No  . Sexually Active:    Other Topics Concern  . None   Social History Narrative  . None   Past Surgical History  Procedure Laterality Date  . Abdominal exploration surgery    . Bunionectomy    . Cervical laminectomy  2000  . Roux-en-y procedure  2009  . Rotator cuff repair  01/2010    right  . Tonsillectomy     Past Medical History  Diagnosis Date  . GERD (gastroesophageal reflux disease)   . Arthritis   . Asthma   . Cancer     sebaceous cancer dx. 2008  . Depression   . Diabetes mellitus   . Chronic headaches   . Allergy   . Migraines   . Thyroid disease   . Hyperlipidemia   . Shingles   . Pneumonia   . Fibromyalgia   . Obesity   . Cancer of sebaceous glands   . Hypertension     no per patient   BP 118/61  Pulse 107  Resp 14  Ht 5\' 5"  (1.651 m)  Wt 144 lb (65.318 kg)  BMI 23.96 kg/m2  SpO2 100%     Review of Systems  Constitutional: Positive for diaphoresis.  Gastrointestinal: Positive for nausea.  Musculoskeletal: Positive for back pain.  All other systems reviewed and are negative.       Objective:   Physical Exam Constitutional: She is oriented to person, place, and time. She appears well-developed and well-nourished.  HENT:  Head: Normocephalic.  Neck: Neck supple.  Musculoskeletal: She exhibits tenderness.  Neurological: She is alert and oriented to person, place, and time.  Skin: Skin is warm and dry.  Psychiatric: She has a normal mood and affect.  Symmetric normal motor tone is noted throughout. Normal muscle bulk. Muscle testing reveals 5/5 muscle strength of the upper extremity, and 5/5 of the lower extremity. Full range of motion in upper and lower extremities. ROM of spine is  mildly restricted. Fine motor movements are normal in both hands.  Sensory is intact and symmetric to light touch, pinprick and proprioception.  DTR in the upper and lower extremity are present and symmetric 2+. No clonus is noted.  Patient arises from chair without difficulty. Narrow based gait with normal arm swing bilateral , able to walk on heels and toes . Tandem walk is stable. No pronator drift. Rhomberg negative.  T-spine kyphotic , shoulders protracted  C-spine hyperlordosis, head protracted        Assessment & Plan:  1. Myofascial pain syndrome . She has no evidence to suggest fibromyalgia syndrome at this point.  Showed her exercises for posture training, showed her some tricks to correct her posture, also exercises for strengthening the muscles responsible for a good posture.  2. Bilateral trochanteric bursitis. Those are not bothering her today.  3. Cervical postlaminectomy syndrome, with occipital headaches, every morning, she has tried several different pillows with no success, she takes tylenol every morning, she might have developed a rebound headache. She has a Hx of migraines, I referred her to a neurologist, she has seen Dr. Terrace Arabia, and was not very happy with that visit, because she felt the doctor was not listening to her. A MRI of the brain was ordered, which was normal. She has seen Dr. Danielle Dess about 6 month ago, he told her that her ACDF/cervical spine looks fine . She can increase her gabapentin at night to help with her headaches and numbness of her fore arms in the morning.She would take 2 tablet in am and 1 tablet mid day and 2 of her 300mg  at night. Prescribed Voltaren gel for exacerbation/inflammation of cervical spondylosis, she can not take oral NSAIDs because of gastric bypass surgery.  She d/c the Nortriptyline, it did not Help her. Robaxin caused pruritus, and she d/c it. I did not prescribe another muscle relaxant , because of interaction with her psych  medication.Recommended magnesium powder against muscle cramps/ pain. Advised patient to look into aquatic exercises. Prescribed PT for pain relief, and muscle relaxation with modalities and a trial of a TENS treatment. Follow up in about 2 month.

## 2012-07-21 NOTE — Patient Instructions (Signed)
Continue with your walking program. 

## 2012-08-04 ENCOUNTER — Ambulatory Visit: Payer: BC Managed Care – PPO | Attending: Physical Medicine and Rehabilitation

## 2012-08-04 DIAGNOSIS — M545 Low back pain, unspecified: Secondary | ICD-10-CM | POA: Insufficient documentation

## 2012-08-04 DIAGNOSIS — IMO0001 Reserved for inherently not codable concepts without codable children: Secondary | ICD-10-CM | POA: Insufficient documentation

## 2012-08-04 DIAGNOSIS — R5381 Other malaise: Secondary | ICD-10-CM | POA: Insufficient documentation

## 2012-08-05 ENCOUNTER — Ambulatory Visit: Payer: Self-pay | Admitting: Nurse Practitioner

## 2012-08-05 ENCOUNTER — Telehealth: Payer: Self-pay | Admitting: Nurse Practitioner

## 2012-08-08 ENCOUNTER — Ambulatory Visit: Payer: BC Managed Care – PPO

## 2012-08-09 ENCOUNTER — Ambulatory Visit (INDEPENDENT_AMBULATORY_CARE_PROVIDER_SITE_OTHER): Payer: BC Managed Care – PPO | Admitting: Nurse Practitioner

## 2012-08-09 ENCOUNTER — Encounter: Payer: Self-pay | Admitting: Nurse Practitioner

## 2012-08-09 VITALS — BP 113/66 | HR 111 | Ht 66.0 in | Wt 142.0 lb

## 2012-08-09 DIAGNOSIS — G43909 Migraine, unspecified, not intractable, without status migrainosus: Secondary | ICD-10-CM | POA: Insufficient documentation

## 2012-08-09 DIAGNOSIS — E119 Type 2 diabetes mellitus without complications: Secondary | ICD-10-CM

## 2012-08-09 DIAGNOSIS — I10 Essential (primary) hypertension: Secondary | ICD-10-CM

## 2012-08-09 NOTE — Patient Instructions (Addendum)
Begin Topamax 25 mg and titrate weekly as instructed Keep a diary of headaches and bring to next appointment Avoid migraine triggers if possible Followup in 6 weeks

## 2012-08-09 NOTE — Progress Notes (Signed)
HPI: She returns for followup after initial evaluation Dr. Terrace Arabia 05/09/2012. She has a history of migraine headaches. Her migraines are in the vertex region sometimes preceded by visual aura associated with noise and light sensitivity and nausea. She takes Amerge acutely which has been helpful. She also has another kind of headache that starts in the shoulder and neck area she has a long history of chronic low back pain in addition, she has a history of diabetes, depression, anxiety and fibromyalgia. She is seen in the pain clinic. Headache frequency several times a month and they are severe. Symptoms include  photophobia, phonophobia, visual disturbances, nausea or vomiting. She did not begin topiramate at her last visit. She reports one episode where she was driving the car and couldn't quite remember where she was or where she was going. There is no history of seizure. This was an isolated incident.  ROS:  -blurred vision, memory loss, confusion, headache, difficulty swallowing, allergies, runny nose, skin sensitivity   Physical Exam General: well developed, well nourished, seated, in no evident distress Head: head normocephalic and atraumatic. Oropharynx benign Neck: supple with no carotid or supraclavicular bruits Cardiovascular: regular rate and rhythm, no murmurs  Neurologic Exam Mental Status: Awake and fully alert. Oriented to place and time. Recent and remote memory intact. Attention span, concentration and fund of knowledge appropriate. Mood and affect appropriate.  Cranial Nerves:Pupils equal, briskly reactive to light. Extraocular movements full without nystagmus. Visual fields full to confrontation. Hearing intact and symmetric to finger snap. Facial sensation intact. Face, tongue, palate move normally and symmetrically. Neck flexion and extension normal.  Motor: Normal bulk and tone. Normal strength in all tested extremity muscles. Sensory.: intact to touch and pinprick and vibratory.   Coordination: Rapid alternating movements normal in all extremities. Finger-to-nose and heel-to-shin performed accurately bilaterally. Gait and Station: Arises from chair without difficulty. Stance is normal. Gait demonstrates normal stride length and balance . Able to heel, toe and tandem walk without difficulty.  Reflexes: 2+and symmetric. Toes downgoing.     ASSESSMENT: History of migraine headaches, mixed headache with blurry vision, originating in the neck and shoulder. She did not start her topiramate after last visit. Normal neurologic exam. MRI of the brain was read as normal.    PLAN: Begin Topamax 25 mg and titrate weekly as instructed Keep a diary of headaches and bring to next appointment Avoid migraine triggers if possible Call if you have another episode where you can't remember what you're doing or where  you're supposed to be going. May get EEG Keep blood pressure in a normal range Keep blood sugars in a normal range  Followup in 6 to 8 weeks  Nilda Riggs, GNP-BC APRN

## 2012-08-11 ENCOUNTER — Ambulatory Visit: Payer: BC Managed Care – PPO | Attending: Physical Medicine and Rehabilitation | Admitting: Physical Therapy

## 2012-08-11 DIAGNOSIS — M545 Low back pain, unspecified: Secondary | ICD-10-CM | POA: Insufficient documentation

## 2012-08-11 DIAGNOSIS — IMO0001 Reserved for inherently not codable concepts without codable children: Secondary | ICD-10-CM | POA: Insufficient documentation

## 2012-08-11 DIAGNOSIS — R5381 Other malaise: Secondary | ICD-10-CM | POA: Insufficient documentation

## 2012-08-19 ENCOUNTER — Ambulatory Visit: Payer: BC Managed Care – PPO | Admitting: Physical Therapy

## 2012-08-22 ENCOUNTER — Ambulatory Visit: Payer: BC Managed Care – PPO

## 2012-08-22 NOTE — Telephone Encounter (Signed)
done

## 2012-08-23 ENCOUNTER — Encounter: Payer: Self-pay | Admitting: Internal Medicine

## 2012-08-23 ENCOUNTER — Ambulatory Visit (INDEPENDENT_AMBULATORY_CARE_PROVIDER_SITE_OTHER): Payer: BC Managed Care – PPO | Admitting: Internal Medicine

## 2012-08-23 VITALS — BP 102/70 | HR 120 | Temp 98.6°F | Resp 16 | Wt 141.0 lb

## 2012-08-23 DIAGNOSIS — I10 Essential (primary) hypertension: Secondary | ICD-10-CM

## 2012-08-23 DIAGNOSIS — E785 Hyperlipidemia, unspecified: Secondary | ICD-10-CM

## 2012-08-23 DIAGNOSIS — E119 Type 2 diabetes mellitus without complications: Secondary | ICD-10-CM

## 2012-08-23 LAB — HEMOGLOBIN A1C: Hgb A1c MFr Bld: 6.8 % — ABNORMAL HIGH (ref 4.6–6.5)

## 2012-08-23 MED ORDER — CANAGLIFLOZIN 300 MG PO TABS: 1.0000 | ORAL_TABLET | ORAL | Status: DC

## 2012-08-23 MED ORDER — LIRAGLUTIDE 18 MG/3ML ~~LOC~~ SOPN: 1.8000 mg | PEN_INJECTOR | Freq: Every day | SUBCUTANEOUS | Status: DC

## 2012-08-23 MED ORDER — SAXAGLIPTIN-METFORMIN ER 2.5-1000 MG PO TB24: 1.0000 | ORAL_TABLET | Freq: Two times a day (BID) | ORAL | Status: DC

## 2012-08-23 NOTE — Patient Instructions (Addendum)
Limit your sodium (Salt) intake    It is important that you exercise regularly, at least 20 minutes 3 to 4 times per week.  If you develop chest pain or shortness of breath seek  medical attention. 

## 2012-08-23 NOTE — Progress Notes (Signed)
Subjective:    Patient ID: Rhonda Farley, female    DOB: 1951-04-04, 62 y.o.   MRN: [id number]  HPI  62 year old patient who is seen today for followup. She has hypertension dyslipidemia and type 2 diabetes. She states blood sugars have trended upward and fasting blood sugars are now often in the 200 range. Last visit she was switched to [place] do to insurance concerns but wishes to resume Victoza which was much better tolerated. She has been on a submaximal dose of metformin but is agreeable to a dose titration. Blood pressure has been well-controlled. Last visit simvastatin was decreased to 20 mg daily due 2 back pain. Back pain has not been effective.  Past Medical History  Diagnosis Date  . GERD (gastroesophageal reflux disease)   . Arthritis   . Asthma   . Cancer     sebaceous cancer dx. 2008  . Depression   . Diabetes mellitus   . Chronic headaches   . Allergy   . Migraines   . Thyroid disease   . Hyperlipidemia   . Shingles   . Pneumonia   . Fibromyalgia   . Obesity   . Cancer of sebaceous glands   . Hypertension     no per patient    History   Social History  . Marital Status: Married    Spouse Name: N/A    Number of Children: N/A  . Years of Education: N/A   Occupational History  . Not on file.   Social History Main Topics  . Smoking status: Former Smoker    Quit date: 04/13/1977  . Smokeless tobacco: Never Used  . Alcohol Use: No  . Drug Use: No  . Sexually Active: Not on file   Other Topics Concern  . Not on file   Social History Narrative  . No narrative on file    Past Surgical History  Procedure Laterality Date  . Abdominal exploration surgery    . Bunionectomy    . Cervical laminectomy  2000  . Roux-en-y procedure  2009  . Rotator cuff repair  01/2010    right  . Tonsillectomy      Family History  Problem Relation Age of Onset  . Arthritis Mother   . Cancer Mother     colon/skin  . Colon cancer Mother   . Cancer Father    lung/prostate  . Diabetes Paternal Uncle   . Colon cancer Paternal Uncle   . Arthritis Paternal Grandmother   . Heart disease Paternal Grandmother   . Arthritis Paternal Grandfather   . Cancer Paternal Grandfather     prostate  . Esophageal cancer Maternal Aunt     No Known Allergies  Current Outpatient Prescriptions on File Prior to Visit  Medication Sig Dispense Refill  . B Complex-C (SUPER B COMPLEX PO) Take by mouth daily.      . budesonide-formoterol (SYMBICORT) 80-4.5 MCG/ACT inhaler Inhale 2 puffs into the lungs as needed.  1 Inhaler  3  . buPROPion (WELLBUTRIN XL) 150 MG 24 hr tablet Take 1 tablet (150 mg total) by mouth every morning.  90 tablet  3  . butalbital-acetaminophen-caffeine (FIORICET, ESGIC) 50-325-40 MG per tablet Take 2 tablets by mouth as needed.  90 tablet  3  . cetirizine (ZYRTEC) 10 MG tablet Take 10 mg by mouth daily.        . Coenzyme Q10 (CO Q 10 PO) Take by mouth daily.      . cyanocobalamin (COBAL-1000) 1000 MCG/ML  injection Inject 1 mL (1,000 mcg total) into the muscle every 30 (thirty) days. PT may self adminster  10 mL  1  . diclofenac sodium (VOLTAREN) 1 % GEL Apply 2 g topically 4 (four) times daily.  2 Tube  2  . DULoxetine (CYMBALTA) 60 MG capsule Take 1 capsule (60 mg total) by mouth daily.  90 capsule  3  . esomeprazole (NEXIUM) 40 MG capsule Take 1 capsule (40 mg total) by mouth 2 (two) times daily.  180 capsule  3  . estradiol (VIVELLE-DOT) 0.05 MG/24HR Place 1 patch onto the skin once a week.      . fish oil-omega-3 fatty acids 1000 MG capsule Take 1 g by mouth daily.        Marland Kitchen gabapentin (NEURONTIN) 300 MG capsule Take 300 mg by mouth 5 (five) times daily. 2 tabs in AM, 1 afternoon, 2 at Bedtime      . GLUCOSAMINE HCL-MSM PO Take by mouth daily.      Marland Kitchen levothyroxine (SYNTHROID, LEVOTHROID) 88 MCG tablet Take 1 tablet (88 mcg total) by mouth daily.  90 tablet  3  . Liraglutide (VICTOZA) 18 MG/3ML SOPN Inject 18 mg into the skin daily.      Marland Kitchen  LORazepam (ATIVAN) 1 MG tablet Take 1 tablet (1 mg total) by mouth at bedtime.  30 tablet  3  . methocarbamol (ROBAXIN) 500 MG tablet Take 1 tablet (500 mg total) by mouth 3 (three) times daily.  270 tablet  3  . montelukast (SINGULAIR) 10 MG tablet Take 1 tablet (10 mg total) by mouth at bedtime.  90 tablet  3  . Multiple Vitamin (MULTIVITAMIN) capsule Take 1 capsule by mouth daily.        . naratriptan (AMERGE) 2.5 MG tablet Take 1 tablet (2.5 mg total) by mouth as needed. Take one (1) tablet at onset of headache; if returns or does not resolve, may repeat after 4 hours; do not exceed five (5) mg in 24 hours.  10 tablet  3  . Olopatadine HCl (PATADAY) 0.2 % SOLN Apply to eye.        . progesterone (PROMETRIUM) 100 MG capsule Take daily starting the first of each month for 10days      . Saxagliptin-Metformin 08-998 MG TB24 Take 1 tablet by mouth daily.  90 tablet  6  . simvastatin (ZOCOR) 40 MG tablet Take 0.5 tablets (20 mg total) by mouth at bedtime.  90 tablet  3  . Thiamine HCl (VITAMIN B-1) 100 MG tablet Take 100 mg by mouth daily.        Marland Kitchen topiramate (TOPAMAX) 25 MG tablet Take 25 mg by mouth 2 (two) times daily. 25mg  daily for 1 week then increase by 25 mg weekly until dose is 100mg .      . valACYclovir (VALTREX) 500 MG tablet Take 1 tablet (500 mg total) by mouth 2 (two) times daily.  180 tablet  3   No current facility-administered medications on file prior to visit.    BP 102/70  Pulse 120  Temp(Src) 98.6 F (37 C) (Oral)  Resp 16  Wt 141 lb (63.957 kg)  BMI 22.77 kg/m2  SpO2 97%      Review of Systems  Constitutional: Negative.   HENT: Negative for hearing loss, congestion, sore throat, rhinorrhea, dental problem, sinus pressure and tinnitus.   Eyes: Negative for pain, discharge and visual disturbance.  Respiratory: Negative for cough and shortness of breath.   Cardiovascular: Negative for chest  pain, palpitations and leg swelling.  Gastrointestinal: Negative for  nausea, vomiting, abdominal pain, diarrhea, constipation, blood in stool and abdominal distention.  Genitourinary: Negative for dysuria, urgency, frequency, hematuria, flank pain, vaginal bleeding, vaginal discharge, difficulty urinating, vaginal pain and pelvic pain.  Musculoskeletal: Positive for myalgias, back pain and arthralgias. Negative for joint swelling and gait problem.  Skin: Negative for rash.  Neurological: Negative for dizziness, syncope, speech difficulty, weakness, numbness and headaches.  Hematological: Negative for adenopathy.  Psychiatric/Behavioral: Negative for behavioral problems, dysphoric mood and agitation. The patient is not nervous/anxious.        Objective:   Physical Exam  Constitutional: She is oriented to person, place, and time. She appears well-developed and well-nourished.  HENT:  Head: Normocephalic.  Right Ear: External ear normal.  Left Ear: External ear normal.  Mouth/Throat: Oropharynx is clear and moist.  Eyes: Conjunctivae and EOM are normal. Pupils are equal, round, and reactive to light.  Neck: Normal range of motion. Neck supple. No thyromegaly present.  Cardiovascular: Normal rate, regular rhythm, normal heart sounds and intact distal pulses.   Pulmonary/Chest: Effort normal and breath sounds normal.  Abdominal: Soft. Bowel sounds are normal. She exhibits no mass. There is no tenderness.  Musculoskeletal: Normal range of motion.  Lymphadenopathy:    She has no cervical adenopathy.  Neurological: She is alert and oriented to person, place, and time.  Skin: Skin is warm and dry. No rash noted.  Psychiatric: She has a normal mood and affect. Her behavior is normal.          Assessment & Plan:  Diabetes mellitus. Will check a hemoglobin A1c. We'll challenge with metformin 2 g daily in divided dosages Hypertension stable Dyslipidemia  Exercise encouraged Recheck 3 months or as needed

## 2012-08-26 ENCOUNTER — Encounter: Payer: BC Managed Care – PPO | Admitting: Physical Therapy

## 2012-09-19 ENCOUNTER — Encounter
Payer: BC Managed Care – PPO | Attending: Physical Medicine and Rehabilitation | Admitting: Physical Medicine and Rehabilitation

## 2012-09-19 ENCOUNTER — Encounter: Payer: Self-pay | Admitting: Physical Medicine and Rehabilitation

## 2012-09-19 VITALS — BP 104/64 | HR 128 | Resp 14 | Ht 65.0 in | Wt 139.2 lb

## 2012-09-19 DIAGNOSIS — R51 Headache: Secondary | ICD-10-CM

## 2012-09-19 DIAGNOSIS — M961 Postlaminectomy syndrome, not elsewhere classified: Secondary | ICD-10-CM

## 2012-09-19 DIAGNOSIS — IMO0001 Reserved for inherently not codable concepts without codable children: Secondary | ICD-10-CM | POA: Insufficient documentation

## 2012-09-19 DIAGNOSIS — M76899 Other specified enthesopathies of unspecified lower limb, excluding foot: Secondary | ICD-10-CM | POA: Insufficient documentation

## 2012-09-19 DIAGNOSIS — R209 Unspecified disturbances of skin sensation: Secondary | ICD-10-CM | POA: Insufficient documentation

## 2012-09-19 NOTE — Progress Notes (Signed)
Subjective:    Patient ID: Rhonda Farley, female    DOB: 02-28-1951, 62 y.o.   MRN: [id number]  HPI The patient is a 62 year old female, who presents with neck and low back pain . The symptoms started over 5 years ago. The patient complains about moderate pain. Patient denies any radiating symptoms. She describes the pain as hot burning cramping, spasm . Applying heat and ice, taking medications , changing positions alleviate the symptoms. Prolonged sitting and standing aggrevates the symptoms. The patient grades his pain as a 4 /10. The patient also complains about LBP. 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 Hx of ACDF C5-7. She also complains about headaches every morning, and numbness in both of her for arms , depending on her position. She reports that the TENS unit, ordered at her last visit has given her great relief.  Pain Inventory Average Pain 6 Pain Right Now 2 My pain is sharp, burning and tingling  In the last 24 hours, has pain interfered with the following? General activity 2 Relation with others 2 Enjoyment of life 2 What TIME of day is your pain at its worst? evening Sleep (in general) Fair  Pain is worse with: sitting, inactivity and standing Pain improves with: rest, heat/ice and TENS Relief from Meds: 3  Mobility walk without assistance how many minutes can you walk? 60 ability to climb steps?  yes do you drive?  yes  Function not employed: date last employed .  Neuro/Psych tingling  Prior Studies Any changes since last visit?  no  Physicians involved in your care Any changes since last visit?  no   Family History  Problem Relation Age of Onset  . Arthritis Mother   . Cancer Mother     colon/skin  . Colon cancer Mother   . Cancer Father     lung/prostate  . Diabetes Paternal Uncle   . Colon cancer Paternal Uncle   . Arthritis Paternal Grandmother   . Heart disease Paternal Grandmother   . Arthritis Paternal Grandfather   . Cancer  Paternal Grandfather     prostate  . Esophageal cancer Maternal Aunt    History   Social History  . Marital Status: Married    Spouse Name: N/A    Number of Children: N/A  . Years of Education: N/A   Social History Main Topics  . Smoking status: Former Smoker    Quit date: 04/13/1977  . Smokeless tobacco: Never Used  . Alcohol Use: No  . Drug Use: No  . Sexually Active: None   Other Topics Concern  . None   Social History Narrative  . None   Past Surgical History  Procedure Laterality Date  . Abdominal exploration surgery    . Bunionectomy    . Cervical laminectomy  2000  . Roux-en-y procedure  2009  . Rotator cuff repair  01/2010    right  . Tonsillectomy     Past Medical History  Diagnosis Date  . GERD (gastroesophageal reflux disease)   . Arthritis   . Asthma   . Cancer     sebaceous cancer dx. 2008  . Depression   . Diabetes mellitus   . Chronic headaches   . Allergy   . Migraines   . Thyroid disease   . Hyperlipidemia   . Shingles   . Pneumonia   . Fibromyalgia   . Obesity   . Cancer of sebaceous glands   . Hypertension  no per patient   BP 104/64  Pulse 128  Resp 14  Ht 5\' 5"  (1.651 m)  Wt 139 lb 3.2 oz (63.141 kg)  BMI 23.16 kg/m2  SpO2 99%    Review of Systems  Constitutional: Positive for appetite change.  Gastrointestinal: Positive for nausea.  Neurological:       Tingling  All other systems reviewed and are negative.       Objective:   Physical Exam Constitutional: She is oriented to person, place, and time. She appears well-developed and well-nourished.  HENT:  Head: Normocephalic.  Neck: Neck supple.  Musculoskeletal: She exhibits tenderness.  Neurological: She is alert and oriented to person, place, and time.  Skin: Skin is warm and dry.  Psychiatric: She has a normal mood and affect.  Symmetric normal motor tone is noted throughout. Normal muscle bulk. Muscle testing reveals 5/5 muscle strength of the upper  extremity, and 5/5 of the lower extremity. Full range of motion in upper and lower extremities. ROM of spine is mildly restricted. Fine motor movements are normal in both hands.  Sensory is intact and symmetric to light touch, pinprick and proprioception.  DTR in the upper and lower extremity are present and symmetric 2+. No clonus is noted.  Patient arises from chair without difficulty. Narrow based gait with normal arm swing bilateral , able to walk on heels and toes . Tandem walk is stable. No pronator drift. Rhomberg negative.  T-spine kyphotic , shoulders protracted  C-spine hyperlordosis, head protracted        Assessment & Plan:  1. Myofascial pain syndrome . She has no evidence to suggest fibromyalgia syndrome at this point.  Showed her exercises for posture training, showed her some tricks to correct her posture, also exercises for strengthening the muscles responsible for a good posture.  2. Bilateral trochanteric bursitis. Those are not bothering her today.  3. Cervical postlaminectomy syndrome, with occipital headaches, every morning, she has tried several different pillows with no success, she takes tylenol every morning, she might have developed a rebound headache. She has a Hx of migraines, I referred her to a neurologist, she has seen Dr. Terrace Arabia, and was not very happy with that visit, because she felt the doctor was not listening to her. A MRI of the brain was ordered, which was normal. She has seen Dr. Danielle Dess about 8 month ago, he told her that her ACDF/cervical spine looks fine . She can increase her gabapentin at night to help with her headaches and numbness of her fore arms in the morning.She would take 2 tablet in am and 1 tablet mid day and 2 of her 300mg  at night. Prescribed Voltaren gel for exacerbation/inflammation of cervical spondylosis, she can not take oral NSAIDs because of gastric bypass surgery.The Voltaren gel gave her some relief.  She d/c the Nortriptyline, it did not  Help her. Robaxin caused pruritus, and she d/c it. I did not prescribe another muscle relaxant , because of interaction with her psych medication.Recommended magnesium powder against muscle cramps/ pain.  Advised patient to look into aquatic exercises. Prescribed PT for pain relief, and muscle relaxation with modalities and a trial of a TENS treatment at her last visit, the TENS unit gave her great relief, she has one at home now, and uses it regularly. Follow up in about 3 month.

## 2012-09-19 NOTE — Patient Instructions (Addendum)
Continue with your aquatic exercises. Continue with using your TENS unit for pain relief

## 2012-09-28 ENCOUNTER — Telehealth: Payer: Self-pay | Admitting: Internal Medicine

## 2012-09-28 NOTE — Telephone Encounter (Signed)
PT called to request a 3 month supply of DULoxetine (CYMBALTA) 60 MG capsule, with 3 refills. She would like this to be called into [facility] She also needs the needles that go onto the Liraglutide (VICTOZA) 18 MG/3ML SOPN   syringe. Please assist.

## 2012-09-29 MED ORDER — DULOXETINE HCL 60 MG PO CPEP: 60.0000 mg | ORAL_CAPSULE | Freq: Every day | ORAL | Status: DC

## 2012-09-29 MED ORDER — INSULIN PEN NEEDLE 32G X 6 MM MISC: 1.0000 | Freq: Every day | Status: AC

## 2012-09-29 NOTE — Telephone Encounter (Signed)
Pt notified Rx's sent to pharmacy as requested. 

## 2012-10-05 ENCOUNTER — Encounter: Payer: Self-pay | Admitting: Nurse Practitioner

## 2012-10-05 ENCOUNTER — Ambulatory Visit (INDEPENDENT_AMBULATORY_CARE_PROVIDER_SITE_OTHER): Payer: BC Managed Care – PPO | Admitting: Nurse Practitioner

## 2012-10-05 VITALS — BP 112/63 | HR 103 | Ht 66.0 in | Wt 141.0 lb

## 2012-10-05 DIAGNOSIS — G43909 Migraine, unspecified, not intractable, without status migrainosus: Secondary | ICD-10-CM

## 2012-10-05 MED ORDER — TOPIRAMATE 25 MG PO TABS: 50.0000 mg | ORAL_TABLET | Freq: Two times a day (BID) | ORAL | Status: DC

## 2012-10-05 NOTE — Patient Instructions (Addendum)
Good headache control at present May decrease Topamax 25 mg but if headaches return go  back to total dose of 100mg  daily Renew prescription for 3 months Followup in 6-8 months

## 2012-10-05 NOTE — Progress Notes (Signed)
HPI: Patient returns  for followup after last visit 08/09/2012.  She has a history of migraine headaches. Her migraines are in the vertex region sometimes preceded by visual aura associated with noise and light sensitivity and nausea. She takes Amerge acutely which has been helpful. She also has another kind of headache that starts in the shoulder and neck area she has a long history of chronic low back pain in addition, she has a history of diabetes, depression, anxiety and fibromyalgia. She is seen in the pain clinic. Headache frequency several times a month and they are severe. Symptoms include photophobia, phonophobia, visual disturbances, nausea or vomiting. She reports one episode where she was driving the car and couldn't quite remember where she was or where she was going. There is no history of seizure. This was an isolated incident. She was started on Topamax to titrate to 100 mg daily at her last visit. She says she has had one bad headaches since  that time. She denies any side effects to the Topamax. She is pleased with her response to the medication   ROS:  Fatigue, muscle cramps, headache, insomnia,   Physical Exam General: well developed, well nourished, seated, in no evident distress Head: head normocephalic and atraumatic. Oropharynx benign Neck: supple with no carotid  bruits Cardiovascular: regular rate and rhythm, no murmurs  Neurologic Exam Mental Status: Awake and fully alert. Oriented to place and time. Follows all commands. Speech and language normal.   Cranial Nerves: Pupils equal, briskly reactive to light. Extraocular movements full without nystagmus. Visual fields full to confrontation. Hearing intact and symmetric to finger snap. Facial sensation intact. Face, tongue, palate move normally and symmetrically. Neck flexion and extension normal.  Motor: Normal bulk and tone. Normal strength in all tested extremity muscles.No focal weakness Sensory.: intact to touch and  pinprick and vibratory.  Coordination: Rapid alternating movements normal in all extremities. Finger-to-nose and heel-to-shin performed accurately bilaterally. Gait and Station: Arises from chair without difficulty. Stance is normal.  Able to heel, toe and tandem walk without difficulty.  Reflexes: 2+ and symmetric. Toes downgoing.     ASSESSMENT: History of migraine headaches, and mixed headache with blurry vision originating in the neck and shoulders, doing well on the Topamax at 100 mg daily. Normal MRI of the brain. Normal neurologic exam     PLAN:May decrease Topamax 25 mg but if headaches return go  back to total dose of 100mg  daily Renew prescription for 3 months with refills Followup in 6-8 months   Nilda Riggs, GNP-BC APRN

## 2012-10-17 ENCOUNTER — Other Ambulatory Visit: Payer: Self-pay

## 2012-10-17 DIAGNOSIS — Z1231 Encounter for screening mammogram for malignant neoplasm of breast: Secondary | ICD-10-CM

## 2012-10-21 LAB — HM DIABETES EYE EXAM

## 2012-11-03 ENCOUNTER — Ambulatory Visit: Payer: BC Managed Care – PPO

## 2012-11-04 ENCOUNTER — Encounter: Payer: Self-pay | Admitting: Gastroenterology

## 2012-11-07 ENCOUNTER — Encounter: Payer: Self-pay | Admitting: Internal Medicine

## 2012-11-22 ENCOUNTER — Ambulatory Visit: Payer: BC Managed Care – PPO | Admitting: Internal Medicine

## 2012-11-30 ENCOUNTER — Ambulatory Visit: Payer: BC Managed Care – PPO | Admitting: Cardiovascular Disease

## 2012-12-19 ENCOUNTER — Encounter
Payer: BC Managed Care – PPO | Attending: Physical Medicine and Rehabilitation | Admitting: Physical Medicine and Rehabilitation

## 2012-12-19 ENCOUNTER — Encounter: Payer: Self-pay | Admitting: Physical Medicine and Rehabilitation

## 2012-12-19 VITALS — BP 106/59 | HR 88 | Resp 14 | Ht 65.0 in | Wt 138.6 lb

## 2012-12-19 DIAGNOSIS — Z981 Arthrodesis status: Secondary | ICD-10-CM | POA: Insufficient documentation

## 2012-12-19 DIAGNOSIS — IMO0001 Reserved for inherently not codable concepts without codable children: Secondary | ICD-10-CM | POA: Insufficient documentation

## 2012-12-19 DIAGNOSIS — R51 Headache: Secondary | ICD-10-CM | POA: Insufficient documentation

## 2012-12-19 DIAGNOSIS — M545 Low back pain, unspecified: Secondary | ICD-10-CM | POA: Insufficient documentation

## 2012-12-19 DIAGNOSIS — M961 Postlaminectomy syndrome, not elsewhere classified: Secondary | ICD-10-CM | POA: Insufficient documentation

## 2012-12-19 DIAGNOSIS — K219 Gastro-esophageal reflux disease without esophagitis: Secondary | ICD-10-CM | POA: Insufficient documentation

## 2012-12-19 DIAGNOSIS — M542 Cervicalgia: Secondary | ICD-10-CM | POA: Insufficient documentation

## 2012-12-19 DIAGNOSIS — E119 Type 2 diabetes mellitus without complications: Secondary | ICD-10-CM | POA: Insufficient documentation

## 2012-12-19 DIAGNOSIS — E785 Hyperlipidemia, unspecified: Secondary | ICD-10-CM | POA: Insufficient documentation

## 2012-12-19 DIAGNOSIS — M76899 Other specified enthesopathies of unspecified lower limb, excluding foot: Secondary | ICD-10-CM | POA: Insufficient documentation

## 2012-12-19 NOTE — Patient Instructions (Signed)
Continue with your exercise program, as pain permits

## 2012-12-19 NOTE — Progress Notes (Signed)
Subjective:    Patient ID: Rhonda Farley, female    DOB: 1950/11/23, 62 y.o.   MRN: [id number]  HPI The patient is a 62 year old female, who presents with neck and low back pain . The symptoms started over 5 years ago. The patient complains about moderate pain. Patient denies any radiating symptoms. She describes the pain as hot burning cramping, spasm . Applying heat and ice, taking medications , changing positions alleviate the symptoms. Prolonged sitting and standing aggrevates the symptoms. The patient grades his pain as a 4 /10. The patient also complains about LBP. 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 Hx of ACDF C5-7. She reports that the TENS unit, ordered at her last visit has given her great relief. Her problem is stable.  Pain Inventory Average Pain 5 Pain Right Now 4 My pain is na  In the last 24 hours, has pain interfered with the following? General activity 9 Relation with others 9 Enjoyment of life 9 What TIME of day is your pain at its worst? daytime Sleep (in general) Fair  Pain is worse with: bending, sitting, inactivity, standing and some activites Pain improves with: rest, heat/ice and TENS Relief from Meds: 5  Mobility walk without assistance how many minutes can you walk? 60 ability to climb steps?  yes do you drive?  yes Do you have any goals in this area?  no  Function not employed: date last employed na I need assistance with the following:  household duties  Neuro/Psych numbness  Prior Studies Any changes since last visit?  no  Physicians involved in your care Any changes since last visit?  no   Family History  Problem Relation Age of Onset  . Arthritis Mother   . Cancer Mother     colon/skin  . Colon cancer Mother   . Cancer Father     lung/prostate  . Diabetes Paternal Uncle   . Colon cancer Paternal Uncle   . Arthritis Paternal Grandmother   . Heart disease Paternal Grandmother   . Arthritis Paternal Grandfather   .  Cancer Paternal Grandfather     prostate  . Esophageal cancer Maternal Aunt    History   Social History  . Marital Status: Married    Spouse Name: N/A    Number of Children: N/A  . Years of Education: N/A   Social History Main Topics  . Smoking status: Former Smoker    Quit date: 04/13/1977  . Smokeless tobacco: Never Used  . Alcohol Use: No  . Drug Use: No  . Sexual Activity: None   Other Topics Concern  . None   Social History Narrative  . None   Past Surgical History  Procedure Laterality Date  . Abdominal exploration surgery    . Bunionectomy    . Cervical laminectomy  2000  . Roux-en-y procedure  2009  . Rotator cuff repair  01/2010    right  . Tonsillectomy     Past Medical History  Diagnosis Date  . GERD (gastroesophageal reflux disease)   . Arthritis   . Asthma   . Cancer     sebaceous cancer dx. 2008  . Depression   . Diabetes mellitus   . Chronic headaches   . Allergy   . Migraines   . Thyroid disease   . Hyperlipidemia   . Shingles   . Pneumonia   . Fibromyalgia   . Obesity   . Cancer of sebaceous glands   .  Hypertension     no per patient  . Tachycardia    BP 106/59  Pulse 88  Resp 14  Ht 5\' 5"  (1.651 m)  Wt 138 lb 9.6 oz (62.869 kg)  BMI 23.06 kg/m2  SpO2 99%     Review of Systems  Constitutional: Positive for diaphoresis.  Neurological: Positive for numbness.  All other systems reviewed and are negative.       Objective:   Physical Exam Constitutional: She is oriented to person, place, and time. She appears well-developed and well-nourished.  HENT:  Head: Normocephalic.  Neck: Neck supple.  Musculoskeletal: She exhibits tenderness in Lattissimus dorsi on the left  Neurological: She is alert and oriented to person, place, and time.  Skin: Skin is warm and dry.  Psychiatric: She has a normal mood and affect.  Symmetric normal motor tone is noted throughout. Normal muscle bulk. Muscle testing reveals 5/5 muscle  strength of the upper extremity, and 5/5 of the lower extremity. Full range of motion in upper and lower extremities. ROM of spine is mildly restricted. Fine motor movements are normal in both hands.  Sensory is intact and symmetric to light touch, pinprick and proprioception.  DTR in the upper and lower extremity are present and symmetric 2+. No clonus is noted.  Patient arises from chair without difficulty. Narrow based gait with normal arm swing bilateral , able to walk on heels and toes . Tandem walk is stable. No pronator drift. Rhomberg negative.  T-spine kyphotic , shoulders protracted , scoliosis left konvex C-spine hyperlordosis, head protracted        Assessment & Plan:  1. Myofascial pain syndrome . She has no evidence to suggest fibromyalgia syndrome at this point.  Showed her exercises for posture training, showed her some tricks to correct her posture, also exercises for strengthening the muscles responsible for a good posture. Showed her also some stretching exercises, to relax her muscles. 2. Bilateral trochanteric bursitis. Those are not bothering her today.  3. Cervical postlaminectomy syndrome, with occipital headaches, every morning, she has tried several different pillows with no success, she takes tylenol every morning, she might have developed a rebound headache. She has a Hx of migraines, I referred her to a neurologist, she has seen Dr. Terrace Arabia, and was not very happy with that visit, because she felt the doctor was not listening to her.She has followed up with the NP at [facility], she is on topamax, and her headaches have improved.  A MRI of the brain was ordered, which was normal. She has seen Dr. Danielle Dess about 8 month ago, he told her that her ACDF/cervical spine looks fine .  Continue Voltaren gel for exacerbation/inflammation of cervical spondylosis, she can not take oral NSAIDs because of gastric bypass surgery.The Voltaren gel gives her some relief.  She d/c the Nortriptyline,  it did not Help her. Robaxin caused pruritus, and she d/c it. I did not prescribe another muscle relaxant , because of interaction with her psych medication.Recommended magnesium powder against muscle cramps/ pain.  Advised patient to look into aquatic exercises. Prescribed PT for pain relief, and muscle relaxation with modalities at her last visit, she was not very happy with the PT office, and stoped after about 4 visits, she is doing exercises, and swimming on her own now.  The TENS unit gives her great relief, she has one at home now, and uses it regularly. Follow up in about 3 month.

## 2012-12-28 ENCOUNTER — Other Ambulatory Visit: Payer: Self-pay | Admitting: Internal Medicine

## 2012-12-28 ENCOUNTER — Ambulatory Visit (INDEPENDENT_AMBULATORY_CARE_PROVIDER_SITE_OTHER): Payer: BC Managed Care – PPO | Admitting: Cardiovascular Disease

## 2012-12-28 ENCOUNTER — Encounter: Payer: Self-pay | Admitting: Cardiovascular Disease

## 2012-12-28 VITALS — BP 100/68 | HR 92 | Ht 65.0 in | Wt 137.0 lb

## 2012-12-28 DIAGNOSIS — R079 Chest pain, unspecified: Secondary | ICD-10-CM

## 2012-12-28 DIAGNOSIS — R000 Tachycardia, unspecified: Secondary | ICD-10-CM

## 2012-12-28 DIAGNOSIS — G43909 Migraine, unspecified, not intractable, without status migrainosus: Secondary | ICD-10-CM

## 2012-12-28 DIAGNOSIS — I10 Essential (primary) hypertension: Secondary | ICD-10-CM

## 2012-12-28 DIAGNOSIS — E119 Type 2 diabetes mellitus without complications: Secondary | ICD-10-CM

## 2012-12-28 DIAGNOSIS — E039 Hypothyroidism, unspecified: Secondary | ICD-10-CM

## 2012-12-28 DIAGNOSIS — E785 Hyperlipidemia, unspecified: Secondary | ICD-10-CM

## 2012-12-28 DIAGNOSIS — R002 Palpitations: Secondary | ICD-10-CM | POA: Insufficient documentation

## 2012-12-28 NOTE — Assessment & Plan Note (Signed)
Cholesterol is at goal.  Continue current dose of statin and diet Rx.  No myalgias or side effects.  F/U  LFT's in 6 months. Lab Results  Component Value Date   LDLCALC 52 12/03/2011

## 2012-12-28 NOTE — Addendum Note (Signed)
Addended by: Tonita Phoenix on: 12/28/2012 04:37 PM   Modules accepted: Orders

## 2012-12-28 NOTE — Assessment & Plan Note (Signed)
Discussed low carb diet.  Target hemoglobin A1c is 6.5 or less.  Continue current medications.  

## 2012-12-28 NOTE — Progress Notes (Signed)
Patient ID: Rhonda Farley, female   DOB: 18-Nov-1950, 62 y.o.   MRN: [id number] 62 yo referred for tachycardia and palpitations  Going on for a few months.  Trigger is eating usually  HR feels as high as ? 160  She has had nausea as well and needs to f/u with GI.  Does not want to see Dr Russella Dar.  Has had bariatric surgery 5 years ago and has kept 100lbs off.  On synthroid and last TSH suppressed 8/13  She though it was rechecked in May but there is only an A1c in Epic.  When her HR goes high she has chest pressure and dyspnea.  Happens multiple times/week and is not getting better.  Denies other stimulants although fiorocet has caffeine in it She takes this for headaches.  No syncope Activity limited when she feels rapid HR  DM not well controlled A1c 6.8 in May  ROS: Denies fever, malais, weight loss, blurry vision, decreased visual acuity, cough, sputum, SOB, hemoptysis, pleuritic pain, palpitaitons, heartburn, abdominal pain, melena, lower extremity edema, claudication, or rash.  All other systems reviewed and negative   General: Affect appropriate Healthy:  appears stated age HEENT: normal Neck supple with no adenopathy JVP normal no bruits no thyromegaly Lungs clear with no wheezing and good diaphragmatic motion Heart:  S1/S2 no murmur,rub, gallop or click PMI normal Abdomen: benighn, BS positve, no tenderness, no AAA no bruit.  No HSM or HJR Distal pulses intact with no bruits No edema Neuro non-focal Skin warm and dry No muscular weakness  Medications Current Outpatient Prescriptions  Medication Sig Dispense Refill  . B Complex-C (SUPER B COMPLEX PO) Take by mouth daily.      . budesonide-formoterol (SYMBICORT) 80-4.5 MCG/ACT inhaler Inhale 2 puffs into the lungs as needed.  1 Inhaler  3  . buPROPion (WELLBUTRIN XL) 150 MG 24 hr tablet Take 1 tablet (150 mg total) by mouth every morning.  90 tablet  3  . butalbital-acetaminophen-caffeine (FIORICET, ESGIC) 50-325-40 MG per tablet Take  2 tablets by mouth as needed.  90 tablet  3  . Canagliflozin (INVOKANA) 300 MG TABS Take 1 tablet by mouth 1 day or 1 dose.  90 tablet  3  . cetirizine (ZYRTEC) 10 MG tablet Take 10 mg by mouth daily.        . Coenzyme Q10 (CO Q 10 PO) Take by mouth daily.      . cyanocobalamin (COBAL-1000) 1000 MCG/ML injection Inject 1 mL (1,000 mcg total) into the muscle every 30 (thirty) days. PT may self adminster  10 mL  1  . diclofenac sodium (VOLTAREN) 1 % GEL Apply 2 g topically 4 (four) times daily.  2 Tube  2  . DULoxetine (CYMBALTA) 60 MG capsule Take 1 capsule (60 mg total) by mouth daily.  90 capsule  3  . esomeprazole (NEXIUM) 40 MG capsule Take 1 capsule (40 mg total) by mouth 2 (two) times daily.  180 capsule  3  . estradiol (VIVELLE-DOT) 0.05 MG/24HR Place 1 patch onto the skin once a week.      . fish oil-omega-3 fatty acids 1000 MG capsule Take 1 g by mouth daily.        Marland Kitchen gabapentin (NEURONTIN) 300 MG capsule Take 300 mg by mouth 3 (three) times daily. 2 tabs in AM, 1 afternoon, 2 at Bedtime      . GLUCOSAMINE HCL-MSM PO Take by mouth daily.      . Insulin Pen Needle 32G X  6 MM MISC 1 each by Does not apply route daily.  100 each  2  . Liraglutide (VICTOZA) 18 MG/3ML SOPN Inject 1.8 mg into the skin daily.  1 pen  6  . LORazepam (ATIVAN) 1 MG tablet Take 1 mg by mouth as needed.      . methocarbamol (ROBAXIN) 500 MG tablet Take 500 mg by mouth as needed.      . Multiple Vitamin (MULTIVITAMIN) capsule Take 1 capsule by mouth daily.        . naratriptan (AMERGE) 2.5 MG tablet Take 1 tablet (2.5 mg total) by mouth as needed. Take one (1) tablet at onset of headache; if returns or does not resolve, may repeat after 4 hours; do not exceed five (5) mg in 24 hours.  10 tablet  3  . Olopatadine HCl (PATADAY) 0.2 % SOLN Apply to eye as needed.       . progesterone (PROMETRIUM) 100 MG capsule Take daily starting the first of each month for 10days      . propranolol (INDERAL) 10 MG tablet Take by mouth  as needed. Take 1 tablet (10 mg total) by mouth 2 (two) times daily. As needed      . Saxagliptin-Metformin (KOMBIGLYZE XR) 2.08-998 MG TB24 Take 1 tablet by mouth 2 (two) times daily.  180 tablet  7  . simvastatin (ZOCOR) 40 MG tablet Take 0.5 tablets (20 mg total) by mouth at bedtime.  90 tablet  3  . Thiamine HCl (VITAMIN B-1) 100 MG tablet Take 100 mg by mouth daily.        Marland Kitchen topiramate (TOPAMAX) 25 MG tablet Take 2 tablets (50 mg total) by mouth 2 (two) times daily.  360 tablet  1  . valACYclovir (VALTREX) 500 MG tablet Take 1 tablet (500 mg total) by mouth 2 (two) times daily.  180 tablet  3  . levothyroxine (SYNTHROID, LEVOTHROID) 88 MCG tablet Take 1 tablet (88 mcg total) by mouth daily.  90 tablet  3  . montelukast (SINGULAIR) 10 MG tablet Take 1 tablet (10 mg total) by mouth at bedtime.  90 tablet  3   No current facility-administered medications for this visit.    Allergies Review of patient's allergies indicates no known allergies.  Family History: Family History  Problem Relation Age of Onset  . Arthritis Mother   . Cancer Mother     colon/skin  . Colon cancer Mother   . Cancer Father     lung/prostate  . Diabetes Paternal Uncle   . Colon cancer Paternal Uncle   . Arthritis Paternal Grandmother   . Heart disease Paternal Grandmother   . Arthritis Paternal Grandfather   . Cancer Paternal Grandfather     prostate  . Esophageal cancer Maternal Aunt     Social History: History   Social History  . Marital Status: Married    Spouse Name: N/A    Number of Children: N/A  . Years of Education: N/A   Occupational History  . Not on file.   Social History Main Topics  . Smoking status: Former Smoker    Quit date: 04/13/1977  . Smokeless tobacco: Never Used  . Alcohol Use: No  . Drug Use: No  . Sexual Activity: Not on file   Other Topics Concern  . Not on file   Social History Narrative  . No narrative on file    Electrocardiogram:  SR rate 92 normal    Assessment and Plan

## 2012-12-28 NOTE — Patient Instructions (Addendum)
Your physician recommends that you schedule a follow-up appointment in: 6-8 WEEKS WITH DR [facility] Your physician recommends that you continue on your current medications as directed. Please refer to the Current Medication list given to you today. Your physician has requested that you have a stress echocardiogram. For further information please visit [email or web address] Please follow instruction sheet as given.  Your physician recommends that you return for lab work in:  TODAY   TSH   FREE T4   DX   785.0 Your physician has recommended that you wear an event monitor. Event monitors are medical devices that record the heart's electrical activity. Doctors most often [place] these monitors to diagnose arrhythmias. Arrhythmias are problems with the speed or rhythm of the heartbeat. The monitor is a small, portable device. You can wear one while you do your normal daily activities. This is usually used to diagnose what is causing palpitations/syncope (passing out).   DR Vida Rigger [phone] DR Bernette Redbird [phone]

## 2012-12-28 NOTE — Assessment & Plan Note (Signed)
Well controlled.  Continue current medications and low sodium Dash type diet.   Consider adding daily beta blocker since Inderal PRN helps

## 2012-12-28 NOTE — Assessment & Plan Note (Signed)
TSH/T4 today given palpitations and history of suppressed TSH

## 2012-12-28 NOTE — Assessment & Plan Note (Signed)
Try to avoid fiorocet given palpitations and caffeine

## 2012-12-28 NOTE — Assessment & Plan Note (Signed)
Event monitor.  Check thyroid  PRN Inderal  Stress echo to check chronotropic response and r/o structural heart disease since she has dyspnea and chest pressure with episodes.

## 2012-12-29 ENCOUNTER — Other Ambulatory Visit: Payer: Self-pay | Admitting: *Deleted

## 2012-12-29 ENCOUNTER — Telehealth: Payer: Self-pay | Admitting: Internal Medicine

## 2012-12-29 MED ORDER — GABAPENTIN 300 MG PO CAPS: 300.0000 mg | ORAL_CAPSULE | Freq: Three times a day (TID) | ORAL | Status: DC

## 2012-12-29 MED ORDER — LORAZEPAM 1 MG PO TABS: 1.0000 mg | ORAL_TABLET | ORAL | Status: DC | PRN

## 2012-12-29 NOTE — Telephone Encounter (Signed)
We received notice from [facility] that patient no longer has an acct wit them. We had sent them a rx for ATIVAN. I tried to call and check with the patient to see the correct pharmacy, but was unable to reach her. Please try to call and find out correct pharmacy. We probably need to resend the prescriptions. Thank you.

## 2012-12-30 ENCOUNTER — Encounter (INDEPENDENT_AMBULATORY_CARE_PROVIDER_SITE_OTHER): Payer: BC Managed Care – PPO

## 2012-12-30 ENCOUNTER — Telehealth: Payer: Self-pay | Admitting: *Deleted

## 2012-12-30 DIAGNOSIS — R002 Palpitations: Secondary | ICD-10-CM

## 2012-12-30 DIAGNOSIS — R000 Tachycardia, unspecified: Secondary | ICD-10-CM

## 2012-12-30 MED ORDER — LORAZEPAM 1 MG PO TABS: 1.0000 mg | ORAL_TABLET | ORAL | Status: DC | PRN

## 2012-12-30 MED ORDER — GABAPENTIN 300 MG PO CAPS: 300.0000 mg | ORAL_CAPSULE | Freq: Three times a day (TID) | ORAL | Status: DC

## 2012-12-30 NOTE — Telephone Encounter (Signed)
Received My Chart message from pt with pharmacy info. Rx's resent to correct pharmacy.

## 2012-12-30 NOTE — Telephone Encounter (Signed)
21 day event monitor placed on Pt 12/30/12 TK

## 2013-01-10 ENCOUNTER — Ambulatory Visit (INDEPENDENT_AMBULATORY_CARE_PROVIDER_SITE_OTHER): Payer: BC Managed Care – PPO | Admitting: Internal Medicine

## 2013-01-10 ENCOUNTER — Encounter: Payer: Self-pay | Admitting: Internal Medicine

## 2013-01-10 VITALS — BP 100/60 | HR 125 | Temp 98.0°F | Resp 18 | Wt 138.0 lb

## 2013-01-10 DIAGNOSIS — E039 Hypothyroidism, unspecified: Secondary | ICD-10-CM

## 2013-01-10 DIAGNOSIS — E119 Type 2 diabetes mellitus without complications: Secondary | ICD-10-CM

## 2013-01-10 DIAGNOSIS — Z23 Encounter for immunization: Secondary | ICD-10-CM

## 2013-01-10 DIAGNOSIS — R002 Palpitations: Secondary | ICD-10-CM

## 2013-01-10 NOTE — Patient Instructions (Signed)
Discontinued thyroid medication  Return in 6 weeks for followup

## 2013-01-10 NOTE — Progress Notes (Signed)
  Subjective:    Patient ID: Rhonda Farley, female    DOB: 05/31/1950, 62 y.o.   MRN: [id number]  HPI  62 year old patient who is seen today for followup. She has a history of type 2 diabetes and hypertension which has been stable.  More recently she has been having palpitations and has been seen by cardiology. She has a long history of a thyroid disorder and approximately 20 years ago was treated with antiviral drugs she eventually required treatment with levothyroxin supplementation.  Her TSH has remained suppressed in spite of decreasing dosing. She has developed palpitations with occasional pulse rates as high as 160. Free T4 has been normal. She states she must eat frequently to maintain her weight  Wt Readings from Last 3 Encounters:  01/10/13 138 lb (62.596 kg)  12/28/12 137 lb (62.143 kg)  12/19/12 138 lb 9.6 oz (62.869 kg)    Review of Systems  Constitutional: Positive for appetite change.  HENT: Negative for hearing loss, congestion, sore throat, rhinorrhea, dental problem, sinus pressure and tinnitus.   Eyes: Negative for pain, discharge and visual disturbance.  Respiratory: Negative for cough and shortness of breath.   Cardiovascular: Positive for palpitations. Negative for chest pain and leg swelling.  Gastrointestinal: Negative for nausea, vomiting, abdominal pain, diarrhea, constipation, blood in stool and abdominal distention.  Genitourinary: Negative for dysuria, urgency, frequency, hematuria, flank pain, vaginal bleeding, vaginal discharge, difficulty urinating, vaginal pain and pelvic pain.  Musculoskeletal: Negative for joint swelling, arthralgias and gait problem.  Skin: Negative for rash.  Neurological: Negative for dizziness, syncope, speech difficulty, weakness, numbness and headaches.  Hematological: Negative for adenopathy.  Psychiatric/Behavioral: Negative for behavioral problems, dysphoric mood and agitation. The patient is not nervous/anxious.         Objective:   Physical Exam  Constitutional: She is oriented to person, place, and time. She appears well-developed and well-nourished.  Anxious  Pulse rate 120  HENT:  Head: Normocephalic.  Right Ear: External ear normal.  Left Ear: External ear normal.  Mouth/Throat: Oropharynx is clear and moist.  Eyes: Conjunctivae and EOM are normal. Pupils are equal, round, and reactive to light.  Neck: Normal range of motion. Neck supple. No thyromegaly present.  Cardiovascular: Normal rate, regular rhythm, normal heart sounds and intact distal pulses.   Pulmonary/Chest: Effort normal and breath sounds normal.  Abdominal: Soft. Bowel sounds are normal. She exhibits no mass. There is no tenderness.  Musculoskeletal: Normal range of motion.  Lymphadenopathy:    She has no cervical adenopathy.  Neurological: She is alert and oriented to person, place, and time.  Tremor Hyperreflexia  Skin: Skin is warm and dry. No rash noted.  Psychiatric: She has a normal mood and affect. Her behavior is normal.          Assessment & Plan:   Patient is clinically hyperthyroid in spite of decreasing thyroid dosing. Patient has probably evolved into hyperthyroidism. She denies taking any additional thyroid supplements or any stimulants. Will hold Synthroid for 6 weeks and reassess at that time. If still hyperthyroid will evaluate with a thyroid scan and uptake  Diabetes mellitus. We'll check a hemoglobin A1c in 6 weeks with her other lab draw

## 2013-01-12 ENCOUNTER — Ambulatory Visit
Admission: RE | Admit: 2013-01-12 | Discharge: 2013-01-12 | Disposition: A | Discharge status: Home / Self Care | Payer: BC Managed Care – PPO | Source: Ambulatory Visit

## 2013-01-12 DIAGNOSIS — Z1231 Encounter for screening mammogram for malignant neoplasm of breast: Secondary | ICD-10-CM

## 2013-01-13 ENCOUNTER — Other Ambulatory Visit (HOSPITAL_COMMUNITY): Payer: BC Managed Care – PPO

## 2013-02-08 ENCOUNTER — Ambulatory Visit: Payer: BC Managed Care – PPO | Admitting: Cardiovascular Disease

## 2013-02-09 ENCOUNTER — Telehealth: Payer: Self-pay | Admitting: *Deleted

## 2013-02-09 NOTE — Telephone Encounter (Signed)
PT  AWARE OF  MONITOR RESULTS  PER DR NISHAN  NSR   NO ARRHYTHMIAS./CY 

## 2013-02-21 ENCOUNTER — Encounter: Payer: Self-pay | Admitting: Internal Medicine

## 2013-02-21 ENCOUNTER — Ambulatory Visit (INDEPENDENT_AMBULATORY_CARE_PROVIDER_SITE_OTHER): Payer: BC Managed Care – PPO | Admitting: Internal Medicine

## 2013-02-21 VITALS — BP 98/60 | HR 104 | Temp 97.7°F | Resp 18 | Wt 131.0 lb

## 2013-02-21 DIAGNOSIS — R002 Palpitations: Secondary | ICD-10-CM

## 2013-02-21 DIAGNOSIS — E039 Hypothyroidism, unspecified: Secondary | ICD-10-CM

## 2013-02-21 DIAGNOSIS — I10 Essential (primary) hypertension: Secondary | ICD-10-CM

## 2013-02-21 DIAGNOSIS — E119 Type 2 diabetes mellitus without complications: Secondary | ICD-10-CM

## 2013-02-21 DIAGNOSIS — E785 Hyperlipidemia, unspecified: Secondary | ICD-10-CM

## 2013-02-21 LAB — HEMOGLOBIN A1C: Hgb A1c MFr Bld: 6.6 % — ABNORMAL HIGH (ref 4.6–6.5)

## 2013-02-21 LAB — CBC WITH DIFFERENTIAL/PLATELET
Basophils Relative: 0.6 % (ref 0.0–3.0)
Eosinophils Relative: 3.9 % (ref 0.0–5.0)
HCT: 43.3 % (ref 36.0–46.0)
Hemoglobin: 14.4 g/dL (ref 12.0–15.0)
Lymphs Abs: 2.4 10*3/uL (ref 0.7–4.0)
MCV: 94.9 fl (ref 78.0–100.0)
Monocytes Absolute: 0.7 10*3/uL (ref 0.1–1.0)
Monocytes Relative: 8.1 % (ref 3.0–12.0)
RBC: 4.57 Mil/uL (ref 3.87–5.11)
WBC: 8.2 10*3/uL (ref 4.5–10.5)

## 2013-02-21 LAB — COMPREHENSIVE METABOLIC PANEL
AST: 19 U/L (ref 0–37)
Alkaline Phosphatase: 60 U/L (ref 39–117)
BUN: 10 mg/dL (ref 6–23)
Creatinine, Ser: 0.6 mg/dL (ref 0.4–1.2)
Potassium: 4.1 mEq/L (ref 3.5–5.1)
Total Bilirubin: 0.4 mg/dL (ref 0.3–1.2)

## 2013-02-21 LAB — LIPID PANEL
Cholesterol: 140 mg/dL (ref 0–200)
HDL: 77 mg/dL (ref >39.00)
LDL Cholesterol: 51 mg/dL (ref 0–99)
Triglycerides: 60 mg/dL (ref 0.0–149.0)
VLDL: 12 mg/dL (ref 0.0–40.0)

## 2013-02-21 LAB — MICROALBUMIN / CREATININE URINE RATIO: Microalb, Ur: 0.5 mg/dL (ref 0.0–1.9)

## 2013-02-21 NOTE — Patient Instructions (Addendum)
It is important that you exercise regularly, at least 20 minutes 3 to 4 times per week.  If you develop chest pain or shortness of breath seek  medical attention.  Limit your sodium (Salt) intake   Please check your hemoglobin A1c every 3 months   

## 2013-02-21 NOTE — Progress Notes (Signed)
Subjective:    Patient ID: Rhonda Farley, female    DOB: [birth year], 62 y.o.   MRN: [id number]  HPI  62 year old patient who is seen today for followup. She was seen 6 weeks ago with a resting tachycardia and a history of frequent palpitations. She had a consistently suppressed TSH.  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 Thyroid medications discontinued 6 weeks ago. She states she has felt more sluggish but palpitations have improved.  She has diabetes. No recent hemoglobin A1c She has hypertension and dyslipidemia. No recent lab  Past Medical History  Diagnosis Date  . GERD (gastroesophageal reflux disease)   . Arthritis   . Asthma   . Cancer     sebaceous cancer dx. 2008  . Depression   . Diabetes mellitus   . Chronic headaches   . Allergy   . Migraines   . Thyroid disease   . Hyperlipidemia   . Shingles   . Pneumonia   . Fibromyalgia   . Obesity   . Cancer of sebaceous glands   . Hypertension     no per patient  . Tachycardia     History   Social History  . Marital Status: Married    Spouse Name: N/A    Number of Children: N/A  . Years of Education: N/A   Occupational History  . Not on file.   Social History Main Topics  . Smoking status: Former Smoker    Quit date: 04/13/1977  . Smokeless tobacco: Never Used  . Alcohol Use: No  . Drug Use: No  . Sexual Activity: Not on file   Other Topics Concern  . Not on file   Social History Narrative  . No narrative on file    Past Surgical History  Procedure Laterality Date  . Abdominal exploration surgery    . Bunionectomy    . Cervical laminectomy  2000  . Roux-en-y procedure  2009  . Rotator cuff repair  01/2010    right  . Tonsillectomy      Family History  Problem Relation Age of Onset  . Arthritis Mother   . Cancer Mother     colon/skin  . Colon cancer Mother   . Cancer Father     lung/prostate  . Diabetes Paternal Uncle   .  Colon cancer Paternal Uncle   . Arthritis Paternal Grandmother   . Heart disease Paternal Grandmother   . Arthritis Paternal Grandfather   . Cancer Paternal Grandfather     prostate  . Esophageal cancer Maternal Aunt     No Known Allergies  Current Outpatient Prescriptions on File Prior to Visit  Medication Sig Dispense Refill  . B Complex-C (SUPER B COMPLEX PO) Take by mouth daily.      . budesonide-formoterol (SYMBICORT) 80-4.5 MCG/ACT inhaler Inhale 2 puffs into the lungs as needed.  1 Inhaler  3  . buPROPion (WELLBUTRIN XL) 150 MG 24 hr tablet Take 1 tablet (150 mg total) by mouth every morning.  90 tablet  3  . butalbital-acetaminophen-caffeine (FIORICET, ESGIC) 50-325-40 MG per tablet Take 2 tablets by mouth as needed.  90 tablet  3  . Canagliflozin (INVOKANA) 300 MG TABS Take 1 tablet by mouth 1 day or 1 dose.  90 tablet  3  . cetirizine (ZYRTEC) 10 MG tablet Take 10 mg by mouth daily.        . Coenzyme Q10 (CO Q 10  PO) Take by mouth daily.      . cyanocobalamin (COBAL-1000) 1000 MCG/ML injection Inject 1 mL (1,000 mcg total) into the muscle every 30 (thirty) days. PT may self adminster  10 mL  1  . diclofenac sodium (VOLTAREN) 1 % GEL Apply 2 g topically 4 (four) times daily.  2 Tube  2  . DULoxetine (CYMBALTA) 60 MG capsule Take 1 capsule (60 mg total) by mouth daily.  90 capsule  3  . esomeprazole (NEXIUM) 40 MG capsule Take 1 capsule (40 mg total) by mouth 2 (two) times daily.  180 capsule  3  . estradiol (VIVELLE-DOT) 0.05 MG/24HR Place 1 patch onto the skin once a week.      . fish oil-omega-3 fatty acids 1000 MG capsule Take 1 g by mouth daily.        Marland Kitchen gabapentin (NEURONTIN) 300 MG capsule Take 1 capsule (300 mg total) by mouth 3 (three) times daily. 2 tabs in AM, 1 afternoon, 2 at Bedtime  450 capsule  1  . GLUCOSAMINE HCL-MSM PO Take by mouth daily.      . Insulin Pen Needle 32G X 6 MM MISC 1 each by Does not apply route daily.  100 each  2  . Liraglutide (VICTOZA) 18  MG/3ML SOPN Inject 1.8 mg into the skin daily.  1 pen  6  . LORazepam (ATIVAN) 1 MG tablet Take 1 tablet (1 mg total) by mouth as needed.  90 tablet  1  . methocarbamol (ROBAXIN) 500 MG tablet Take 500 mg by mouth as needed.      . Multiple Vitamin (MULTIVITAMIN) capsule Take 1 capsule by mouth daily.        . naratriptan (AMERGE) 2.5 MG tablet Take 1 tablet (2.5 mg total) by mouth as needed. Take one (1) tablet at onset of headache; if returns or does not resolve, may repeat after 4 hours; do not exceed five (5) mg in 24 hours.  10 tablet  3  . Olopatadine HCl (PATADAY) 0.2 % SOLN Apply to eye as needed.       . progesterone (PROMETRIUM) 100 MG capsule Take daily starting the first of each month for 10days      . propranolol (INDERAL) 10 MG tablet Take by mouth as needed. Take 1 tablet (10 mg total) by mouth 2 (two) times daily. As needed      . Saxagliptin-Metformin (KOMBIGLYZE XR) 2.08-998 MG TB24 Take 1 tablet by mouth 2 (two) times daily.  180 tablet  7  . simvastatin (ZOCOR) 40 MG tablet Take 0.5 tablets (20 mg total) by mouth at bedtime.  90 tablet  3  . Thiamine HCl (VITAMIN B-1) 100 MG tablet Take 100 mg by mouth daily.        Marland Kitchen topiramate (TOPAMAX) 25 MG tablet Take 2 tablets (50 mg total) by mouth 2 (two) times daily.  360 tablet  1  . valACYclovir (VALTREX) 500 MG tablet Take 1 tablet (500 mg total) by mouth 2 (two) times daily.  180 tablet  3  . montelukast (SINGULAIR) 10 MG tablet Take 1 tablet (10 mg total) by mouth at bedtime.  90 tablet  3   No current facility-administered medications on file prior to visit.    BP 98/60  Pulse 104  Temp(Src) 97.7 F (36.5 C) (Oral)  Resp 18  Wt 131 lb (59.421 kg)  SpO2 98%       Review of Systems  HENT: Negative for congestion, dental problem, hearing  loss, rhinorrhea, sinus pressure, sore throat and tinnitus.   Eyes: Negative for pain, discharge and visual disturbance.  Respiratory: Negative for cough and shortness of breath.    Cardiovascular: Positive for palpitations. Negative for chest pain and leg swelling.  Gastrointestinal: Negative for nausea, vomiting, abdominal pain, diarrhea, constipation, blood in stool and abdominal distention.  Genitourinary: Negative for dysuria, urgency, frequency, hematuria, flank pain, vaginal bleeding, vaginal discharge, difficulty urinating, vaginal pain and pelvic pain.  Musculoskeletal: Negative for arthralgias, gait problem and joint swelling.  Skin: Negative for rash.  Neurological: Positive for weakness. Negative for dizziness, syncope, speech difficulty, numbness and headaches.  Hematological: Negative for adenopathy.  Psychiatric/Behavioral: Negative for behavioral problems, dysphoric mood and agitation. The patient is not nervous/anxious.        Objective:   Physical Exam  Constitutional: She is oriented to person, place, and time. She appears well-developed and well-nourished.  HENT:  Head: Normocephalic.  Right Ear: External ear normal.  Left Ear: External ear normal.  Mouth/Throat: Oropharynx is clear and moist.  Eyes: Conjunctivae and EOM are normal. Pupils are equal, round, and reactive to light.  Neck: Normal range of motion. Neck supple. No thyromegaly present.  Cardiovascular: Normal rate, regular rhythm, normal heart sounds and intact distal pulses.   Pulse 100  Pulmonary/Chest: Effort normal and breath sounds normal.  Abdominal: Soft. Bowel sounds are normal. She exhibits no mass. There is no tenderness.  Musculoskeletal: Normal range of motion.  Lymphadenopathy:    She has no cervical adenopathy.  Neurological: She is alert and oriented to person, place, and time.  No tremor Reflexes remain brisk  Skin: Skin is warm and dry. No rash noted.  Psychiatric: She has a normal mood and affect. Her behavior is normal.          Assessment & Plan:   Palpitations improved History suppressed TSH. Will check thyroid function studies today Hypertension  stable Dyslipidemia. We'll check a lipid profile Diabetes mellitus. We'll check a hemoglobin A1c

## 2013-03-03 ENCOUNTER — Emergency Department (HOSPITAL_BASED_OUTPATIENT_CLINIC_OR_DEPARTMENT_OTHER)
Admission: EM | Admit: 2013-03-03 | Discharge: 2013-03-03 | Disposition: A | Discharge status: Home / Self Care | Payer: BC Managed Care – PPO | Attending: Emergency Medicine | Admitting: Emergency Medicine

## 2013-03-03 DIAGNOSIS — K219 Gastro-esophageal reflux disease without esophagitis: Secondary | ICD-10-CM | POA: Insufficient documentation

## 2013-03-03 DIAGNOSIS — Z79899 Other long term (current) drug therapy: Secondary | ICD-10-CM | POA: Insufficient documentation

## 2013-03-03 DIAGNOSIS — F3289 Other specified depressive episodes: Secondary | ICD-10-CM | POA: Insufficient documentation

## 2013-03-03 DIAGNOSIS — Z8701 Personal history of pneumonia (recurrent): Secondary | ICD-10-CM | POA: Insufficient documentation

## 2013-03-03 DIAGNOSIS — R630 Anorexia: Secondary | ICD-10-CM | POA: Insufficient documentation

## 2013-03-03 DIAGNOSIS — R634 Abnormal weight loss: Secondary | ICD-10-CM | POA: Insufficient documentation

## 2013-03-03 DIAGNOSIS — R112 Nausea with vomiting, unspecified: Secondary | ICD-10-CM | POA: Insufficient documentation

## 2013-03-03 DIAGNOSIS — E119 Type 2 diabetes mellitus without complications: Secondary | ICD-10-CM | POA: Insufficient documentation

## 2013-03-03 DIAGNOSIS — R209 Unspecified disturbances of skin sensation: Secondary | ICD-10-CM | POA: Insufficient documentation

## 2013-03-03 DIAGNOSIS — K3184 Gastroparesis: Secondary | ICD-10-CM | POA: Insufficient documentation

## 2013-03-03 DIAGNOSIS — F329 Major depressive disorder, single episode, unspecified: Secondary | ICD-10-CM | POA: Insufficient documentation

## 2013-03-03 DIAGNOSIS — M129 Arthropathy, unspecified: Secondary | ICD-10-CM | POA: Insufficient documentation

## 2013-03-03 DIAGNOSIS — G43909 Migraine, unspecified, not intractable, without status migrainosus: Secondary | ICD-10-CM | POA: Insufficient documentation

## 2013-03-03 DIAGNOSIS — J45909 Unspecified asthma, uncomplicated: Secondary | ICD-10-CM | POA: Insufficient documentation

## 2013-03-03 DIAGNOSIS — Z9884 Bariatric surgery status: Secondary | ICD-10-CM | POA: Insufficient documentation

## 2013-03-03 DIAGNOSIS — R000 Tachycardia, unspecified: Secondary | ICD-10-CM | POA: Insufficient documentation

## 2013-03-03 DIAGNOSIS — Z872 Personal history of diseases of the skin and subcutaneous tissue: Secondary | ICD-10-CM | POA: Insufficient documentation

## 2013-03-03 DIAGNOSIS — R739 Hyperglycemia, unspecified: Secondary | ICD-10-CM

## 2013-03-03 DIAGNOSIS — G8929 Other chronic pain: Secondary | ICD-10-CM | POA: Insufficient documentation

## 2013-03-03 DIAGNOSIS — Z794 Long term (current) use of insulin: Secondary | ICD-10-CM | POA: Insufficient documentation

## 2013-03-03 DIAGNOSIS — Z85828 Personal history of other malignant neoplasm of skin: Secondary | ICD-10-CM | POA: Insufficient documentation

## 2013-03-03 DIAGNOSIS — I10 Essential (primary) hypertension: Secondary | ICD-10-CM | POA: Insufficient documentation

## 2013-03-03 LAB — COMPREHENSIVE METABOLIC PANEL
ALT: 18 U/L (ref 0–35)
AST: 20 U/L (ref 0–37)
CO2: 29 mEq/L (ref 19–32)
Calcium: 8.9 mg/dL (ref 8.4–10.5)
Chloride: 101 mEq/L (ref 96–112)
GFR calc Af Amer: >90 mL/min (ref >90)
GFR calc non Af Amer: 79 mL/min — ABNORMAL LOW (ref >90)
Glucose, Bld: 96 mg/dL (ref 70–99)
Sodium: 139 mEq/L (ref 135–145)
Total Bilirubin: 0.2 mg/dL — ABNORMAL LOW (ref 0.3–1.2)

## 2013-03-03 LAB — CBC WITH DIFFERENTIAL/PLATELET
Basophils Absolute: 0 10*3/uL (ref 0.0–0.1)
Eosinophils Relative: 4 % (ref 0–5)
HCT: 41.1 % (ref 36.0–46.0)
Lymphocytes Relative: 36 % (ref 12–46)
Lymphs Abs: 3.2 10*3/uL (ref 0.7–4.0)
MCV: 96.5 fL (ref 78.0–100.0)
Monocytes Absolute: 0.7 10*3/uL (ref 0.1–1.0)
Neutro Abs: 4.5 10*3/uL (ref 1.7–7.7)
Platelets: 224 10*3/uL (ref 150–400)
RBC: 4.26 MIL/uL (ref 3.87–5.11)
WBC: 8.9 10*3/uL (ref 4.0–10.5)

## 2013-03-03 LAB — GLUCOSE, CAPILLARY
Glucose-Capillary: 89 mg/dL (ref 70–99)
Glucose-Capillary: 95 mg/dL (ref 70–99)
Glucose-Capillary: 90 mg/dL (ref 70–99)

## 2013-03-03 MED ORDER — METOCLOPRAMIDE HCL 10 MG PO TABS: 10.0000 mg | ORAL_TABLET | Freq: Three times a day (TID) | ORAL | Status: DC

## 2013-03-03 NOTE — ED Notes (Signed)
Pt requests recheck, states her fsbs was over 400 at home, "either your meter is wrong or mine is!"

## 2013-03-03 NOTE — ED Notes (Signed)
Hyperglycemia. Nausea. She has had weight loss. Here with elevated BS of 409. Lips are numb and she is shaky.

## 2013-03-03 NOTE — Discharge Instructions (Signed)
I would recommend: 1)  Restart your Kombiglyze. 2)Start Reglan 30 minutes before meals 3) Talk to your doctor about possibility of Gastric Emptying Study and Gastroparesis 4) Follow up for results of your TSH that was drawn tonight, but not resulted yet.  Gastroparesis  Gastroparesis is also called slowed stomach emptying (delayed gastric emptying). It is a condition in which the stomach takes too long to empty its contents. It often happens in people with diabetes.  CAUSES  Gastroparesis happens when nerves to the stomach are damaged or stop working. When the nerves are damaged, the muscles of the stomach and intestines do not work normally. The movement of food is slowed or stopped. High blood glucose (sugar) causes changes in nerves and can damage the blood vessels that carry oxygen and nutrients to the nerves. RISK FACTORS  Diabetes.  Post-viral syndromes.  Eating disorders (anorexia, bulimia).  Surgery on the stomach or vagus nerve.  Gastroesophageal reflux disease (rarely).  Smooth muscle disorders (amyloidosis, scleroderma).  Metabolic disorders, including hypothyroidism.  Parkinson disease. SYMPTOMS   Heartburn.  Feeling sick to your stomach (nausea).  Vomiting of undigested food.  An early feeling of fullness when eating.  Weight loss.  Abdominal bloating.  Erratic blood glucose levels.  Lack of appetite.  Gastroesophageal reflux.  Spasms of the stomach wall. Complications can include:  Bacterial overgrowth in stomach. Food stays in the stomach and can ferment and cause bacteria to grow.  Weight loss due to difficulty digesting and absorbing nutrients.  Vomiting.  Obstruction in the stomach. Undigested food can harden and cause nausea and vomiting.  Blood glucose fluctuations caused by inconsistent food absorption. DIAGNOSIS  The diagnosis of gastroparesis is confirmed through one or more of the following tests:  Barium X-rays and scans. These  tests look at how long it takes for food to move through the stomach.  Gastric manometry. This test measures electrical and muscular activity in the stomach. A thin tube is passed down the throat into the stomach. The tube contains a wire that takes measurements of the stomach's electrical and muscular activity as it digests liquids and solid food.  Endoscopy. This procedure is done with a long, thin tube called an endoscope. It is passed through the mouth and gently down the esophagus into the stomach. This tube helps the caregiver look at the lining of the stomach to check for any abnormalities.  Ultrasonography. This can rule out gallbladder disease or pancreatitis. This test will outline and define the shape of the gallbladder and pancreas. TREATMENT   Treatments may include:  Exercise.  Medicines to control nausea and vomiting.  Medicines to stimulate stomach muscles.  Changes in what and when you eat.  Having smaller meals more often.  Eating low-fiber forms of high-fiber foods, such as eating cooked vegetables instead of raw vegetables.  Eating low-fat foods.  Consuming liquids, which are easier to digest.  In severe cases, feeding tubes and intravenous (IV) feeding may be needed. It is important to note that in most cases, treatment does not cure gastroparesis. It is usually a lasting (chronic) condition. Treatment helps you manage the underlying condition so that you can be as healthy and comfortable as possible. Other treatments  A gastric neurostimulator has been developed to assist people with gastroparesis. The battery-operated device is surgically implanted. It emits mild electrical pulses to help improve stomach emptying and to control nausea and vomiting.  The use of botulinum toxin has been shown to improve stomach emptying by decreasing the  prolonged contractions of the muscle between the stomach and the small intestine (pyloric sphincter). The benefits are  temporary. SEEK MEDICAL CARE IF:   You have diabetes and you are having problems keeping your blood glucose in goal range.  You are having nausea, vomiting, bloating, or early feelings of fullness with eating.  Your symptoms do not change with a change in diet. Document Released: 03/30/2005 Document Revised: 07/25/2012 Document Reviewed: 09/06/2008 [facility] Patient Information 2014 [place], [place].  Hyperglycemia Hyperglycemia occurs when the glucose (sugar) in your blood is too high. Hyperglycemia can happen for many reasons, but it most often happens to people who do not know they have diabetes or are not managing their diabetes properly.  CAUSES  Whether you have diabetes or not, there are other causes of hyperglycemia. Hyperglycemia can occur when you have diabetes, but it can also occur in other situations that you might not be as aware of, such as: Diabetes  If you have diabetes and are having problems controlling your blood glucose, hyperglycemia could occur because of some of the following reasons:  Not following your meal plan.  Not taking your diabetes medications or not taking it properly.  Exercising less or doing less activity than you normally do.  Being sick. Pre-diabetes  This cannot be ignored. Before people develop Type 2 diabetes, they almost always have "pre-diabetes." This is when your blood glucose levels are higher than normal, but not yet high enough to be diagnosed as diabetes. Research has shown that some long-term damage to the body, especially the heart and circulatory system, may already be occurring during pre-diabetes. If you take action to manage your blood glucose when you have pre-diabetes, you may delay or prevent Type 2 diabetes from developing. Stress  If you have diabetes, you may be "diet" controlled or on oral medications or insulin to control your diabetes. However, you may find that your blood glucose is higher than usual in the hospital  whether you have diabetes or not. This is often referred to as "stress hyperglycemia." Stress can elevate your blood glucose. This happens because of hormones put out by the body during times of stress. If stress has been the cause of your high blood glucose, it can be followed regularly by your caregiver. That way he/she can make sure your hyperglycemia does not continue to get worse or progress to diabetes. Steroids  Steroids are medications that act on the infection fighting system (immune system) to block inflammation or infection. One side effect can be a rise in blood glucose. Most people can produce enough extra insulin to allow for this rise, but for those who cannot, steroids make blood glucose levels go even higher. It is not unusual for steroid treatments to "uncover" diabetes that is developing. It is not always possible to determine if the hyperglycemia will go away after the steroids are stopped. A special blood test called an A1c is sometimes done to determine if your blood glucose was elevated before the steroids were started. SYMPTOMS  Thirsty.  Frequent urination.  Dry mouth.  Blurred vision.  Tired or fatigue.  Weakness.  Sleepy.  Tingling in feet or leg. DIAGNOSIS  Diagnosis is made by monitoring blood glucose in one or all of the following ways:  A1c test. This is a chemical found in your blood.  Fingerstick blood glucose monitoring.  Laboratory results. TREATMENT  First, knowing the cause of the hyperglycemia is important before the hyperglycemia can be treated. Treatment may include, but is not  be limited to:  Education.  Change or adjustment in medications.  Change or adjustment in meal plan.  Treatment for an illness, infection, etc.  More frequent blood glucose monitoring.  Change in exercise plan.  Decreasing or stopping steroids.  Lifestyle changes. HOME CARE INSTRUCTIONS   Test your blood glucose as directed.  Exercise regularly. Your  caregiver will give you instructions about exercise. Pre-diabetes or diabetes which comes on with stress is helped by exercising.  Eat wholesome, balanced meals. Eat often and at regular, fixed times. Your caregiver or nutritionist will give you a meal plan to guide your sugar intake.  Being at an ideal weight is important. If needed, losing as little as 10 to 15 pounds may help improve blood glucose levels. SEEK MEDICAL CARE IF:   You have questions about medicine, activity, or diet.  You continue to have symptoms (problems such as increased thirst, urination, or weight gain). SEEK IMMEDIATE MEDICAL CARE IF:   You are vomiting or have diarrhea.  Your breath smells fruity.  You are breathing faster or slower.  You are very sleepy or incoherent.  You have numbness, tingling, or pain in your feet or hands.  You have chest pain.  Your symptoms get worse even though you have been following your caregiver's orders.  If you have any other questions or concerns. Document Released: 09/23/2000 Document Revised: 06/22/2011 Document Reviewed: 07/27/2011 [facility] Patient Information 2014 [place], [place].

## 2013-03-03 NOTE — ED Provider Notes (Signed)
CSN: [id number]     Arrival date & time 03/03/13  1807 History  This chart was scribed for Roney Marion, MD by Ardelia Mems, ED Scribe. This patient was seen in room MH08/MH08 and the patient's care was started at 6:41 PM.   Chief Complaint  Patient presents with  . Hyperglycemia    The history is provided by the patient. No language interpreter was used.    HPI Comments: Rhonda Farley is a 62 y.o. female with a history of DM, sebaceous cancer, tachycardia and gastric bypass surgery who presents to the Emergency Department complaining of gradually worsening hyperglycemia over the past 4 days. She states that her at home blood sugars have been "creeping up" since her PCP discontinued Kombiglyze 4 days ago, 1 of the 3 oral medications she has been taking for diabetes. She states that she began having lip numbness and bilateral hand paresthesias today, at which time she measured an at home blood sugar of 409, prompting her to come to the ED. She states that she has been a diabetic for 10 years. She states that she has only been on insulin for a 2 week period in this time frame. She denies having a history of diabetic neuropathy, kidney disease, retinal disease or heart disease.  She states that she has lost about 50 pounds in the past 6 months, and that her weight loss has been worse in the past 2 months, during which she has lost about 25 of these 50 pounds. She does have a history of gastric bypass. She attributes her weight loss due to having persistent nausea over the past 2 months, which her caused her to have a reduced appetite. She also states that she feels full with eating small amounts. She states that she vomits only occasionally with this nausea. 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  She also states that she had a  recent change from .125 mg of a thyroid medication to .88 mg, when her PCP noticed that she had low TSH. She states that 8 weeks ago, her PCP completely took her off of this medication. She also states that she was taken off of this medication due to being tachycardic at times, up to 160, and that this was noticed to be worse with eating. Her heart rate in the ED is 81. She states that she had a 30 day event monitor due to her frequent tachycardia, and that sha had a normal report after the 30 days was over.   Past Medical History  Diagnosis Date  . GERD (gastroesophageal reflux disease)   . Arthritis   . Asthma   . Cancer     sebaceous cancer dx. 2008  . Depression   . Diabetes mellitus   . Chronic headaches   . Allergy   . Migraines   . Thyroid disease   . Hyperlipidemia   . Shingles   . Pneumonia   . Fibromyalgia   . Obesity   . Cancer of sebaceous glands   . Hypertension     no per patient  . Tachycardia    Past Surgical History  Procedure Laterality Date  . Abdominal exploration surgery    . Bunionectomy    . Cervical laminectomy  2000  . Roux-en-y procedure  2009  . Rotator cuff repair  01/2010    right  . Tonsillectomy     Family History  Problem Relation Age of Onset  . Arthritis Mother   . Cancer Mother     colon/skin  . Colon cancer Mother   . Cancer Father     lung/prostate  . Diabetes Paternal Uncle   . Colon cancer Paternal Uncle   . Arthritis Paternal Grandmother   . Heart disease Paternal Grandmother   . Arthritis Paternal Grandfather   . Cancer Paternal Grandfather     prostate  . Esophageal cancer Maternal Aunt    History  Substance Use Topics  . Smoking status: Former Smoker    Quit date: 04/13/1977  . Smokeless tobacco: Never Used  . Alcohol Use: No   OB History   Grav Para Term Preterm Abortions TAB SAB Ect Mult Living                 Review of Systems  Constitutional: Positive for appetite change (reduced) and unexpected weight  change ("lost 50 lbs in 6 months"). Negative for fever, chills, diaphoresis and fatigue.  HENT: Negative for mouth sores, sore throat and trouble swallowing.   Eyes: Negative for visual disturbance.  Respiratory: Negative for cough, chest tightness, shortness of breath and wheezing.   Cardiovascular: Negative for chest pain.  Gastrointestinal: Positive for nausea and vomiting ("rarely"). Negative for abdominal pain, diarrhea and abdominal distention.  Endocrine: Negative for polydipsia, polyphagia and polyuria.  Genitourinary: Negative for dysuria, frequency and hematuria.  Musculoskeletal: Negative for gait problem.  Skin: Negative for color change, pallor and rash.  Neurological: Positive for numbness (lip). Negative for dizziness, syncope, light-headedness and headaches.       Bilateral hand paresthesias  Hematological: Does not bruise/bleed easily.  Psychiatric/Behavioral: Negative for behavioral problems and confusion.  All other systems reviewed and are negative.    Allergies  Review of patient's allergies indicates no known allergies.  Home Medications   Current Outpatient Rx  Name  Route  Sig  Dispense  Refill  . B Complex-C (SUPER B COMPLEX PO)   Oral   Take by mouth daily.         . budesonide-formoterol (SYMBICORT) 80-4.5 MCG/ACT inhaler   Inhalation   Inhale 2 puffs into the lungs as needed.   1 Inhaler   3   . buPROPion (WELLBUTRIN XL) 150 MG 24 hr tablet   Oral   Take 1 tablet (150 mg total) by mouth every morning.   90 tablet   3   . butalbital-acetaminophen-caffeine (FIORICET, ESGIC) 50-325-40 MG per tablet   Oral   Take 2 tablets by mouth as needed.   90 tablet   3   . Canagliflozin (INVOKANA) 300 MG TABS   Oral   Take 1 tablet by mouth 1 day or 1 dose.   90 tablet   3   . cetirizine (ZYRTEC) 10 MG tablet   Oral   Take 10 mg by mouth daily.           . Coenzyme Q10 (CO Q 10 PO)   Oral   Take by mouth daily.         . cyanocobalamin  (COBAL-1000) 1000 MCG/ML injection   Intramuscular   Inject 1 mL (1,000 mcg total) into the muscle every 30 (thirty) days. PT may self adminster   10 mL   1   . diclofenac sodium (VOLTAREN) 1 % GEL   Topical   Apply 2 g topically 4 (four) times  daily.   2 Tube   2   . DULoxetine (CYMBALTA) 60 MG capsule   Oral   Take 1 capsule (60 mg total) by mouth daily.   90 capsule   3   . esomeprazole (NEXIUM) 40 MG capsule   Oral   Take 1 capsule (40 mg total) by mouth 2 (two) times daily.   180 capsule   3   . estradiol (VIVELLE-DOT) 0.05 MG/24HR   Transdermal   Place 1 patch onto the skin once a week.         . fish oil-omega-3 fatty acids 1000 MG capsule   Oral   Take 1 g by mouth daily.           Marland Kitchen gabapentin (NEURONTIN) 300 MG capsule   Oral   Take 1 capsule (300 mg total) by mouth 3 (three) times daily. 2 tabs in AM, 1 afternoon, 2 at Bedtime   450 capsule   1   . GLUCOSAMINE HCL-MSM PO   Oral   Take by mouth daily.         . Insulin Pen Needle 32G X 6 MM MISC   Does not apply   1 each by Does not apply route daily.   100 each   2   . Liraglutide (VICTOZA) 18 MG/3ML SOPN   Subcutaneous   Inject 1.8 mg into the skin daily.   1 pen   6   . LORazepam (ATIVAN) 1 MG tablet   Oral   Take 1 tablet (1 mg total) by mouth as needed.   90 tablet   1   . methocarbamol (ROBAXIN) 500 MG tablet   Oral   Take 500 mg by mouth as needed.         Marland Kitchen EXPIRED: montelukast (SINGULAIR) 10 MG tablet   Oral   Take 1 tablet (10 mg total) by mouth at bedtime.   90 tablet   3   . Multiple Vitamin (MULTIVITAMIN) capsule   Oral   Take 1 capsule by mouth daily.           . naratriptan (AMERGE) 2.5 MG tablet   Oral   Take 1 tablet (2.5 mg total) by mouth as needed. Take one (1) tablet at onset of headache; if returns or does not resolve, may repeat after 4 hours; do not exceed five (5) mg in 24 hours.   10 tablet   3   . Olopatadine HCl (PATADAY) 0.2 % SOLN    Ophthalmic   Apply to eye as needed.          . progesterone (PROMETRIUM) 100 MG capsule      Take daily starting the first of each month for 10days         . propranolol (INDERAL) 10 MG tablet   Oral   Take by mouth as needed. Take 1 tablet (10 mg total) by mouth 2 (two) times daily. As needed         . Saxagliptin-Metformin (KOMBIGLYZE XR) 2.08-998 MG TB24   Oral   Take 1 tablet by mouth 2 (two) times daily.   180 tablet   7   . simvastatin (ZOCOR) 40 MG tablet   Oral   Take 0.5 tablets (20 mg total) by mouth at bedtime.   90 tablet   3   . Thiamine HCl (VITAMIN B-1) 100 MG tablet   Oral   Take 100 mg by mouth daily.           Marland Kitchen  topiramate (TOPAMAX) 25 MG tablet   Oral   Take 2 tablets (50 mg total) by mouth 2 (two) times daily.   360 tablet   1   . valACYclovir (VALTREX) 500 MG tablet   Oral   Take 1 tablet (500 mg total) by mouth 2 (two) times daily.   180 tablet   3    Triage Vitals: BP 114/64  Pulse 81  Temp(Src) 98.3 F (36.8 C) (Oral)  Resp 20  Ht 5\' 5"  (1.651 m)  Wt 128 lb (58.06 kg)  BMI 21.30 kg/m2  SpO2 99%  Physical Exam  Nursing note and vitals reviewed. Constitutional: She is oriented to person, place, and time. She appears well-developed and well-nourished. No distress.  HENT:  Head: Normocephalic.  Eyes: Conjunctivae are normal. Pupils are equal, round, and reactive to light. No scleral icterus.  Neck: Normal range of motion. Neck supple. No thyromegaly present.  Cardiovascular: Normal rate and regular rhythm.  Exam reveals no gallop and no friction rub.   No murmur heard. Pulmonary/Chest: Effort normal and breath sounds normal. No respiratory distress. She has no wheezes. She has no rales.  Abdominal: Soft. Bowel sounds are normal. She exhibits no distension. There is no tenderness. There is no rebound.  Musculoskeletal: Normal range of motion.  Neurological: She is alert and oriented to person, place, and time.  Skin: Skin is  warm and dry. No rash noted.  Psychiatric: She has a normal mood and affect. Her behavior is normal.    ED Course  Procedures (including critical care time)  DIAGNOSTIC STUDIES: Oxygen Saturation is 99% on RA, normal by my interpretation.    COORDINATION OF CARE: 6:56 PM- Discussed clinical suspicion that pt may be having gastroparesis. Discussed plan to obtain diagnostic lab work. Advised pt that her medical history will be further investigated as well. Pt advised of plan for treatment and pt agrees.  Labs Review Labs Reviewed  COMPREHENSIVE METABOLIC PANEL - Abnormal; Notable for the following:    Total Bilirubin 0.2 (*)    GFR calc non Af Amer 79 (*)    All other components within normal limits  GLUCOSE, CAPILLARY  GLUCOSE, CAPILLARY  CBC WITH DIFFERENTIAL  TSH   Imaging Review No results found.  EKG Interpretation   None       MDM   1. Weight loss   2. Hyperglycemia   3. Gastroparesis    . Tachycardias I. feel like she should continue to follow up with her cardiologist regarding this. His regard to anorexia /early satiety, we discussed this can be a sign of gastric cancer. Her syndrome predisposes her to this. Recommended she continue to get her yearly EGD. I have also recommended that she talk to her physician and/or GI doctor regarding a gastric imaging study as her symptoms sound consistent with diabetic gastroparesis.  We'll start her on Reglan. Her symptoms have not improved after discontinuing her Kombiglaze and her sugars have increased, therefore I have asked her to restart this.   I personally performed the services described in this documentation, which was scribed in my presence. The recorded information has been reviewed and is accurate.    Roney Marion, MD 03/03/13 2026

## 2013-03-06 ENCOUNTER — Telehealth: Payer: Self-pay | Admitting: Internal Medicine

## 2013-03-06 NOTE — Telephone Encounter (Signed)
[facility]  [facility] Triage Call Report Triage Record Num: [id number] Operator: Geanie Berlin Patient Name: Rhonda Farley Call Date & Time: 03/03/2013 5:07:25PM Patient Phone: [phone] PCP: Gordy Savers Patient Gender: Female PCP Fax : [phone] Patient DOB: March 30, 1951 [facility] Name: [facility] Reason for Call: Caller: Anaaya/Patient; PCP: Eleonore Chiquito (Family Practice > 77yrs old); CB#: [phone]; Call regarding Elevated blood sugar; Last blood sugar 409 at 1630. Had not eaten anything since 0900 due to severe nausea without vomiting or diarrhea. MD stopped one diabetic medication within last week due to constant nausea. Still taking other oral anti-hypoglycemics. Active/exercised today. Notes polyphasia, nauseated if eats, weakness and lips feel numb. Face is flushed. Advised to see [facility] now for signs and symptoms of ketoacidosis and blood sugar more than 300 mg/dl. Protocol(s) Used: Diabetes: Control Problems Recommended Outcome per Protocol: See ED Immediately Reason for Outcome: Signs and symptoms of ketoacidosis AND blood sugar more than 300 mg/dl Care Advice: ~ Another adult should drive. ~ If available, bring recent log of blood sugars or bring blood glucose monitor with log of blood sugars. Dehydration can affect blood sugar levels. Drink water during transport and while waiting to see a provider. If vomiting, take sips of water or suck on ice chips. ~ ~ IMMEDIATE ACTION Write down provider's name. List or place the following in a bag for transport with the patient: current prescription and/or nonprescription medications; alternative treatments, therapies and medications; and street drugs. ~ 11/

## 2013-03-07 ENCOUNTER — Telehealth: Payer: Self-pay | Admitting: Internal Medicine

## 2013-03-07 MED ORDER — CYANOCOBALAMIN 1000 MCG/ML IJ SOLN: 1000.0000 ug | INTRAMUSCULAR | Status: DC

## 2013-03-07 MED ORDER — VALACYCLOVIR HCL 500 MG PO TABS: 500.0000 mg | ORAL_TABLET | Freq: Two times a day (BID) | ORAL | Status: AC

## 2013-03-07 MED ORDER — METHOCARBAMOL 500 MG PO TABS: 500.0000 mg | ORAL_TABLET | ORAL | Status: DC | PRN

## 2013-03-07 MED ORDER — MONTELUKAST SODIUM 10 MG PO TABS: 10.0000 mg | ORAL_TABLET | Freq: Every day | ORAL | Status: DC

## 2013-03-07 MED ORDER — LIRAGLUTIDE 18 MG/3ML ~~LOC~~ SOPN: 1.8000 mg | PEN_INJECTOR | Freq: Every day | SUBCUTANEOUS | Status: DC

## 2013-03-07 MED ORDER — SIMVASTATIN 40 MG PO TABS: 20.0000 mg | ORAL_TABLET | Freq: Every day | ORAL | Status: DC

## 2013-03-07 MED ORDER — GABAPENTIN 300 MG PO CAPS: 300.0000 mg | ORAL_CAPSULE | Freq: Three times a day (TID) | ORAL | Status: DC

## 2013-03-07 NOTE — Telephone Encounter (Signed)
Rx sent to Mail order pharmacy.  

## 2013-03-07 NOTE — Telephone Encounter (Signed)
Pt  Needs refill of the following: valACYclovir (VALTREX) 500 MG tablet cyanocobalamin (COBAL-1000) 1000 MCG/ML injection simvastatin (ZOCOR) 40 MG tablet Liraglutide (VICTOZA) 18 MG/3ML SOPN gabapentin (NEURONTIN) 300 MG capsule methocarbamol (ROBAXIN) 500 MG tablet montelukast (SINGULAIR) 10 MG tablet 90 day supply for all New pharm: [facility]

## 2013-03-08 ENCOUNTER — Other Ambulatory Visit: Payer: Self-pay | Admitting: *Deleted

## 2013-03-08 MED ORDER — METHOCARBAMOL 500 MG PO TABS: 500.0000 mg | ORAL_TABLET | Freq: Four times a day (QID) | ORAL | Status: DC | PRN

## 2013-03-08 NOTE — Telephone Encounter (Signed)
Pt notified Rx's sent to pharmacy as requested. 

## 2013-03-14 DIAGNOSIS — Z9884 Bariatric surgery status: Secondary | ICD-10-CM | POA: Insufficient documentation

## 2013-03-14 DIAGNOSIS — R634 Abnormal weight loss: Secondary | ICD-10-CM | POA: Insufficient documentation

## 2013-03-14 DIAGNOSIS — R110 Nausea: Secondary | ICD-10-CM | POA: Insufficient documentation

## 2013-03-14 DIAGNOSIS — R000 Tachycardia, unspecified: Secondary | ICD-10-CM | POA: Insufficient documentation

## 2013-03-14 LAB — HM COLONOSCOPY

## 2013-05-02 ENCOUNTER — Ambulatory Visit: Payer: BC Managed Care – PPO | Admitting: Nurse Practitioner

## 2013-05-04 ENCOUNTER — Other Ambulatory Visit: Payer: Self-pay | Admitting: Gastroenterology

## 2013-05-04 DIAGNOSIS — R109 Unspecified abdominal pain: Secondary | ICD-10-CM

## 2013-05-09 ENCOUNTER — Ambulatory Visit
Admission: RE | Admit: 2013-05-09 | Discharge: 2013-05-09 | Disposition: A | Discharge status: Home / Self Care | Payer: BC Managed Care – PPO | Source: Ambulatory Visit | Attending: Gastroenterology | Admitting: Gastroenterology

## 2013-05-09 DIAGNOSIS — R109 Unspecified abdominal pain: Secondary | ICD-10-CM

## 2013-05-09 MED ORDER — IOHEXOL 300 MG/ML  SOLN
100.0000 mL | Freq: Once | INTRAMUSCULAR | Status: AC | PRN
  Administered 2013-05-09: 100 mL via INTRAVENOUS

## 2013-05-16 ENCOUNTER — Ambulatory Visit: Payer: BC Managed Care – PPO | Admitting: Nurse Practitioner

## 2013-05-30 ENCOUNTER — Other Ambulatory Visit: Payer: Self-pay | Admitting: Internal Medicine

## 2013-05-31 MED ORDER — LORAZEPAM 1 MG PO TABS: 1.0000 mg | ORAL_TABLET | Freq: Every day | ORAL | Status: DC | PRN

## 2013-05-31 NOTE — Addendum Note (Signed)
Addended by: Marian Sorrow on: 05/31/2013 02:28 PM   Modules accepted: Orders

## 2013-06-27 ENCOUNTER — Ambulatory Visit: Payer: Self-pay | Admitting: Neurology

## 2013-11-23 DIAGNOSIS — J309 Allergic rhinitis, unspecified: Secondary | ICD-10-CM | POA: Insufficient documentation

## 2013-11-23 DIAGNOSIS — J4520 Mild intermittent asthma, uncomplicated: Secondary | ICD-10-CM | POA: Insufficient documentation

## 2013-11-23 DIAGNOSIS — E782 Mixed hyperlipidemia: Secondary | ICD-10-CM | POA: Insufficient documentation

## 2014-03-27 ENCOUNTER — Other Ambulatory Visit: Payer: Self-pay | Admitting: *Deleted

## 2014-03-27 MED ORDER — SAXAGLIPTIN-METFORMIN ER 2.5-1000 MG PO TB24: 1.0000 | ORAL_TABLET | Freq: Two times a day (BID) | ORAL | Status: DC

## 2014-06-05 ENCOUNTER — Encounter: Payer: Self-pay | Admitting: Gastroenterology

## 2014-10-08 ENCOUNTER — Other Ambulatory Visit: Payer: Self-pay

## 2014-10-22 DIAGNOSIS — M75121 Complete rotator cuff tear or rupture of right shoulder, not specified as traumatic: Secondary | ICD-10-CM | POA: Insufficient documentation

## 2014-10-22 DIAGNOSIS — M7522 Bicipital tendinitis, left shoulder: Secondary | ICD-10-CM | POA: Insufficient documentation

## 2014-10-22 DIAGNOSIS — M7552 Bursitis of left shoulder: Secondary | ICD-10-CM | POA: Insufficient documentation

## 2014-12-10 DIAGNOSIS — Z9889 Other specified postprocedural states: Secondary | ICD-10-CM | POA: Insufficient documentation

## 2015-05-14 ENCOUNTER — Other Ambulatory Visit: Payer: Self-pay

## 2015-05-14 DIAGNOSIS — Z1231 Encounter for screening mammogram for malignant neoplasm of breast: Secondary | ICD-10-CM

## 2015-05-28 ENCOUNTER — Ambulatory Visit
Admission: RE | Admit: 2015-05-28 | Discharge: 2015-05-28 | Disposition: A | Discharge status: Home / Self Care | Payer: BLUE CROSS/BLUE SHIELD | Source: Ambulatory Visit

## 2015-05-28 DIAGNOSIS — Z1231 Encounter for screening mammogram for malignant neoplasm of breast: Secondary | ICD-10-CM

## 2015-05-29 ENCOUNTER — Ambulatory Visit: Payer: Self-pay

## 2015-09-04 ENCOUNTER — Other Ambulatory Visit: Payer: Self-pay | Admitting: Internal Medicine

## 2016-02-20 ENCOUNTER — Other Ambulatory Visit: Payer: Self-pay | Admitting: Rheumatology

## 2016-02-20 NOTE — Telephone Encounter (Signed)
Last visit 11/12/15 Next visit 05/14/16 Ok to refill per Dr Estanislado Pandy

## 2016-05-06 ENCOUNTER — Other Ambulatory Visit: Payer: Self-pay | Admitting: *Deleted

## 2016-05-06 MED ORDER — TRAZODONE HCL 50 MG PO TABS: 50.0000 mg | ORAL_TABLET | Freq: Every day | ORAL | 0 refills | Status: DC

## 2016-05-06 NOTE — Telephone Encounter (Signed)
Refill request received via fax for Trazadone  Last Visit: 11/12/15 Next Visit due February 2018. Message sent to the front to schedule patient.   Okay to refill Trazadone?

## 2016-05-14 ENCOUNTER — Ambulatory Visit: Payer: Self-pay | Admitting: Rheumatology

## 2016-05-25 DIAGNOSIS — Z8639 Personal history of other endocrine, nutritional and metabolic disease: Secondary | ICD-10-CM | POA: Insufficient documentation

## 2016-05-25 DIAGNOSIS — Z8589 Personal history of malignant neoplasm of other organs and systems: Secondary | ICD-10-CM | POA: Insufficient documentation

## 2016-05-25 DIAGNOSIS — Z85828 Personal history of other malignant neoplasm of skin: Secondary | ICD-10-CM | POA: Insufficient documentation

## 2016-05-25 DIAGNOSIS — M7501 Adhesive capsulitis of right shoulder: Secondary | ICD-10-CM | POA: Insufficient documentation

## 2016-05-25 DIAGNOSIS — M19042 Primary osteoarthritis, left hand: Secondary | ICD-10-CM | POA: Insufficient documentation

## 2016-05-25 DIAGNOSIS — M797 Fibromyalgia: Secondary | ICD-10-CM | POA: Insufficient documentation

## 2016-05-25 DIAGNOSIS — Z8719 Personal history of other diseases of the digestive system: Secondary | ICD-10-CM | POA: Insufficient documentation

## 2016-05-25 DIAGNOSIS — M19041 Primary osteoarthritis, right hand: Secondary | ICD-10-CM | POA: Insufficient documentation

## 2016-05-25 DIAGNOSIS — M47812 Spondylosis without myelopathy or radiculopathy, cervical region: Secondary | ICD-10-CM | POA: Insufficient documentation

## 2016-05-25 DIAGNOSIS — F5101 Primary insomnia: Secondary | ICD-10-CM | POA: Insufficient documentation

## 2016-05-25 DIAGNOSIS — Z9884 Bariatric surgery status: Secondary | ICD-10-CM | POA: Insufficient documentation

## 2016-05-25 DIAGNOSIS — B009 Herpesviral infection, unspecified: Secondary | ICD-10-CM | POA: Insufficient documentation

## 2016-05-25 DIAGNOSIS — L409 Psoriasis, unspecified: Secondary | ICD-10-CM | POA: Insufficient documentation

## 2016-05-25 DIAGNOSIS — Z8669 Personal history of other diseases of the nervous system and sense organs: Secondary | ICD-10-CM | POA: Insufficient documentation

## 2016-05-25 NOTE — Progress Notes (Signed)
Office Visit Note  Patient: Rhonda Farley             Date of Birth: 1950/09/08           MRN: [id number]             PCP: Esperanza Richters, MD Referring: Marletta Lor, MD Visit Date: 05/26/2016 Occupation: @GUAROCC @    Subjective:  Follow-up Fibromyalgia, fatigue, insomnia, OA of the hands, left scapular back pain  History of Present Illness: Rhonda Farley is a 66 y.o. female  Last seen 11/12/2015 Patient is doing well with the fibromyalgia overall. She did water aerobics and it helped her tremendously. She rates her fibromyalgia discomfort about 2-3 on a scale of 0-10  She is pleased with her fibromyalgia status at this time.  Overall she is getting good relief with trazodone for the insomnia.  Her main problem today is her left scapular discomfort. She describes the pain starting about the left axilla about T4 rib area radiating along that dermatome to the medial scapular border. There is no associated pain with taking in deep breaths and other movements. She has seen multiple doctors for this and they're not able to discover the source of her discomfort. She describes wearing a bra to be uncomfortable at times. She has tried physical therapy which gave her no relief. (She also had a bad experience with the last physical therapy place and does not want to return there ever again).  I have tried Robaxin and baclofen for the patient's discomfort described above and that has not given her any relief either.  Patient is also complaining about some knee pain at times with stiffness. I have offered her an x-ray for her knees and she is agreeable. We will discussed Visco option in the future if she fails conservative treatment.  Activities of Daily Living:  Patient reports morning stiffness for 15 minutes.   Patient Reports nocturnal pain.  Difficulty dressing/grooming: Denies Difficulty climbing stairs: Denies Difficulty getting out of chair: Denies Difficulty using  hands for taps, buttons, cutlery, and/or writing: Denies   Review of Systems  Constitutional: Negative for fatigue.  HENT: Negative for mouth sores and mouth dryness.   Eyes: Negative for dryness.  Respiratory: Negative for shortness of breath.   Gastrointestinal: Negative for constipation and diarrhea.  Musculoskeletal: Negative for myalgias and myalgias.  Skin: Negative for sensitivity to sunlight.  Psychiatric/Behavioral: Negative for decreased concentration and sleep disturbance.    PMFS History:  Patient Active Problem List   Diagnosis Date Noted  . Fibromyalgia 05/25/2016  . Psoriasis 05/25/2016  . Primary osteoarthritis of both hands 05/25/2016  . DJD (degenerative joint disease), cervical 05/25/2016  . Adhesive capsulitis of right shoulder 05/25/2016  . Primary insomnia 05/25/2016  . History of diabetes mellitus 05/25/2016  . History of hypothyroidism 05/25/2016  . History of migraine 05/25/2016  . History of gastroesophageal reflux (GERD) 05/25/2016  . History of gastric bypass 05/25/2016  . History of sebaceous gland carcinoma 05/25/2016  . 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 Palpitations 12/28/2012  . Migraine, unspecified, without mention of intractable migraine without mention of status migrainosus 08/09/2012  . Myalgia and myositis, unspecified 02/09/2012  . Postlaminectomy syndrome, lumbar region 02/09/2012  . 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 Cramping of hands 11/10/2010  . Back pain 09/08/2010  . Hyperlipidemia 09/08/2010  . COLONIC POLYPS 08/27/2007  . HYPOTHYROIDISM 08/27/2007  . OBESITY 08/27/2007  . EROSIVE GASTRITIS 08/27/2007  . ARTHRITIS 08/27/2007  . DIABETES  MELLITUS, TYPE II 01/14/2007  . HYPERTENSION 01/14/2007  . GERD 01/14/2007  . HEADACHE 01/14/2007    Past Medical History:  Diagnosis Date  . Allergy   . Arthritis   . Asthma   . Cancer ([name])    sebaceous cancer dx. 2008  . Cancer of sebaceous glands   . Chronic headaches   .  Depression   . Diabetes mellitus   . Fibromyalgia   . GERD (gastroesophageal reflux disease)   . Hyperlipidemia   . Hypertension    no per patient  . Migraines   . Obesity   . Pneumonia   . Shingles   . Tachycardia   . Thyroid disease     Family History  Problem Relation Age of Onset  . Arthritis Mother   . Cancer Mother     colon/skin  . Colon cancer Mother   . Cancer Father     lung/prostate  . Diabetes Paternal Uncle   . Colon cancer Paternal Uncle   . Arthritis Paternal Grandmother   . Heart disease Paternal Grandmother   . Arthritis Paternal Grandfather   . Cancer Paternal Grandfather     prostate  . Esophageal cancer Maternal Aunt    Past Surgical History:  Procedure Laterality Date  . ABDOMINAL EXPLORATION SURGERY    . BUNIONECTOMY    . CERVICAL LAMINECTOMY  2000  . ROTATOR CUFF REPAIR  01/2010   right  . ROUX-EN-Y PROCEDURE  2009  . TONSILLECTOMY     Social History   Social History Narrative  . No narrative on file     Objective: Vital Signs: BP 104/60   Pulse 90   Resp 14   Ht 5' 4.5" (1.638 m)   Wt 150 lb (68 kg)   BMI 25.35 kg/m    Physical Exam  Constitutional: She is oriented to person, place, and time. She appears well-developed and well-nourished.  HENT:  Head: Normocephalic and atraumatic.  Eyes: EOM are normal. Pupils are equal, round, and reactive to light.  Cardiovascular: Normal rate, regular rhythm and normal heart sounds.  Exam reveals no gallop and no friction rub.   No murmur heard. Pulmonary/Chest: Effort normal and breath sounds normal. She has no wheezes. She has no rales.  Abdominal: Soft. Bowel sounds are normal. She exhibits no distension. There is no tenderness. There is no guarding. No hernia.  Musculoskeletal: Normal range of motion. She exhibits no edema, tenderness or deformity.  Lymphadenopathy:    She has no cervical adenopathy.  Neurological: She is alert and oriented to person, place, and time. Coordination  normal.  Skin: Skin is warm and dry. Capillary refill takes less than 2 seconds. No rash noted.  Psychiatric: She has a normal mood and affect. Her behavior is normal.  Nursing note and vitals reviewed.    Musculoskeletal Exam:  Full range of motion of all joints Grip strength is equal and strong bilaterally Fibromyalgia tender points are all absent  CDAI Exam: CDAI Homunculus Exam:   Joint Counts:  CDAI Tender Joint count: 0 CDAI Swollen Joint count: 0  Global Assessments:  Patient Global Assessment: 3 Provider Global Assessment: 3  CDAI Calculated Score: 6    Investigation: Findings:  On February 25, 2010, CBC, comprehensive metabolic panel, sed rate, CK, ACE level, ANA, and complements were normal.  Her UA showed protein.        Imaging: Xr Knee 3 View Left  Result Date: 05/26/2016 X-ray left knee 3 views #1: Moderate/severe compartment narrowing  and moderate lateral compartment narrowing. #2: No CPPD. #3: Mild patellofemoral joint space narrowing #4: Lateral tracking of the patella on sunrise view Impression: Moderate osteoarthritis of the left knee   Xr Knee 3 View Right  Result Date: 05/26/2016 X-ray right knee 3 views #1: Moderate/severe compartment narrowing and moderate lateral compartment narrowing. #2: No CPPD. #3: Mild patellofemoral joint space narrowing #4: Lateral tracking of the patella on sunrise view Impression: Moderate osteoarthritis of the right knee   Speciality Comments: No specialty comments available.    Procedures:  No procedures performed Allergies: Citrullus vulgaris   Assessment / Plan:     Visit Diagnoses: Fibromyalgia  Psoriasis  Primary osteoarthritis of both hands  DJD (degenerative joint disease), cervical  Adhesive capsulitis of right shoulder  Primary insomnia  History of diabetes mellitus  History of hypothyroidism  History of migraine  History of gastroesophageal reflux (GERD)  History of gastric  bypass  History of sebaceous gland carcinoma - right arm   history of occular herpes  Chronic pain of right knee - Plan: XR KNEE 3 VIEW RIGHT, CANCELED: XR KNEE 3 VIEW RIGHT  Chronic pain of left knee - Plan: XR KNEE 3 VIEW LEFT, CANCELED: XR KNEE 3 VIEW LEFT   Plan: #1: Fibromyalgia. Doing well. Water aerobics is really help the patient out a lot. Her new fibromyalgia discomfort is rated at 2-3 on a scale of 0-10.  #2: Insomnia. Benefiting from the use of trazodone. She will continue the trazodone and she does not need a refill at this time.  #3: Having some ongoing left scapular pain that's radiating from her left axilla medial part of her scapula. Since been going on for some time now and she has not had any relief with physical therapy, muscle relaxer, multiple Dr. evaluation including pain management doctors and PCP. I offered the patient referral to a neurologist. I also offered her referral to Dr. Barbaraann Barthel, physical therapist, if she chooses to go.  #4: Bilateral knee pain. She's having moderate amount of discomfort in her knees off and on. Her x-rays done today prove that she has moderate osteoarthritis of the knees. After she fails conservative treatments, we can give her cortisone injection and if she fails that we will give her Visco supplementation once approved by her insurance company. I'll wait to see which way the patient would like to proceed in the near future. We will call the patient today and let her know what her x-ray results were  #5: Return to clinic in 6 months   Orders: Orders Placed This Encounter  Procedures  . XR KNEE 3 VIEW RIGHT  . XR KNEE 3 VIEW LEFT   No orders of the defined types were placed in this encounter.   Face-to-face time spent with patient was 30 minutes. 50% of time was spent in counseling and coordination of care.  Follow-Up Instructions: Return in about 6 months (around 11/23/2016) for FMS,  FATIGUE,INSOMNIA,.   Eliezer Lofts, PA-C  Note - This record has been created using [employer].  Chart creation errors have been sought, but may not always  have been located. Such creation errors do not reflect on  the standard of medical care.

## 2016-05-26 ENCOUNTER — Ambulatory Visit (INDEPENDENT_AMBULATORY_CARE_PROVIDER_SITE_OTHER): Payer: BLUE CROSS/BLUE SHIELD | Admitting: Rheumatology

## 2016-05-26 ENCOUNTER — Ambulatory Visit (INDEPENDENT_AMBULATORY_CARE_PROVIDER_SITE_OTHER): Payer: Self-pay

## 2016-05-26 ENCOUNTER — Telehealth: Payer: Self-pay

## 2016-05-26 ENCOUNTER — Encounter: Payer: Self-pay | Admitting: Rheumatology

## 2016-05-26 VITALS — BP 104/60 | HR 90 | Resp 14 | Ht 64.5 in | Wt 150.0 lb

## 2016-05-26 DIAGNOSIS — M19042 Primary osteoarthritis, left hand: Secondary | ICD-10-CM

## 2016-05-26 DIAGNOSIS — M7501 Adhesive capsulitis of right shoulder: Secondary | ICD-10-CM

## 2016-05-26 DIAGNOSIS — G8929 Other chronic pain: Secondary | ICD-10-CM

## 2016-05-26 DIAGNOSIS — M797 Fibromyalgia: Secondary | ICD-10-CM | POA: Diagnosis not present

## 2016-05-26 DIAGNOSIS — M19041 Primary osteoarthritis, right hand: Secondary | ICD-10-CM | POA: Diagnosis not present

## 2016-05-26 DIAGNOSIS — M25562 Pain in left knee: Secondary | ICD-10-CM | POA: Diagnosis not present

## 2016-05-26 DIAGNOSIS — Z9889 Other specified postprocedural states: Secondary | ICD-10-CM

## 2016-05-26 DIAGNOSIS — M25561 Pain in right knee: Secondary | ICD-10-CM | POA: Diagnosis not present

## 2016-05-26 DIAGNOSIS — Z8719 Personal history of other diseases of the digestive system: Secondary | ICD-10-CM

## 2016-05-26 DIAGNOSIS — Z8589 Personal history of malignant neoplasm of other organs and systems: Secondary | ICD-10-CM

## 2016-05-26 DIAGNOSIS — M47812 Spondylosis without myelopathy or radiculopathy, cervical region: Secondary | ICD-10-CM

## 2016-05-26 DIAGNOSIS — Z8639 Personal history of other endocrine, nutritional and metabolic disease: Secondary | ICD-10-CM

## 2016-05-26 DIAGNOSIS — L409 Psoriasis, unspecified: Secondary | ICD-10-CM | POA: Diagnosis not present

## 2016-05-26 DIAGNOSIS — M5030 Other cervical disc degeneration, unspecified cervical region: Secondary | ICD-10-CM

## 2016-05-26 DIAGNOSIS — Z8669 Personal history of other diseases of the nervous system and sense organs: Secondary | ICD-10-CM

## 2016-05-26 DIAGNOSIS — Z9884 Bariatric surgery status: Secondary | ICD-10-CM

## 2016-05-26 DIAGNOSIS — B009 Herpesviral infection, unspecified: Secondary | ICD-10-CM

## 2016-05-26 DIAGNOSIS — F5101 Primary insomnia: Secondary | ICD-10-CM

## 2016-05-26 DIAGNOSIS — Z85828 Personal history of other malignant neoplasm of skin: Secondary | ICD-10-CM

## 2016-05-26 NOTE — Telephone Encounter (Signed)
-----   Message from Eliezer Lofts, [place] sent at 05/26/2016  4:39 PM EST ----- Regarding: Tell patient x-ray results Tell patient Both knees shows moderate osteoarthritis of the knees  X-ray,if knee pain continues,we should discuss Visco supplementation. We will not order this at this time. We will have to fail cortisone injection in one of the knees one time annual have to make an appointment for that appointment if her knee is bothering you and you wanted injected.

## 2016-05-26 NOTE — Telephone Encounter (Signed)
Called and left voicemail advising patient of message concerning x-ray results of her knees.  Advised patient to return call if she decides to have a cort. Injection.

## 2016-10-19 ENCOUNTER — Other Ambulatory Visit: Payer: Self-pay | Admitting: Rheumatology

## 2016-10-19 NOTE — Telephone Encounter (Signed)
ok 

## 2016-10-19 NOTE — Telephone Encounter (Signed)
Last Visit: 05/26/16 Next Visit: 11/24/16  Okay to refill Trazodone?

## 2016-11-17 ENCOUNTER — Ambulatory Visit: Payer: BLUE CROSS/BLUE SHIELD | Admitting: Rheumatology

## 2016-11-24 ENCOUNTER — Ambulatory Visit: Payer: BLUE CROSS/BLUE SHIELD | Admitting: Rheumatology

## 2017-01-12 NOTE — Progress Notes (Deleted)
Office Visit Note  Patient: Rhonda Farley             Date of Birth: 1951-02-19           MRN: [id number]             PCP: Esperanza Richters, MD Referring: Esperanza Richters, MD Visit Date: 01/20/2017 Occupation: _0 @    Subjective:  No chief complaint on file.   History of Present Illness: Rhonda Farley is a 66 y.o. female ***   Activities of Daily Living:  Patient reports morning stiffness for *** {minute/hour:19697}.   Patient {ACTIONS;DENIES/REPORTS:21021675::"Denies"} nocturnal pain.  Difficulty dressing/grooming: {ACTIONS;DENIES/REPORTS:21021675::"Denies"} Difficulty climbing stairs: {ACTIONS;DENIES/REPORTS:21021675::"Denies"} Difficulty getting out of chair: {ACTIONS;DENIES/REPORTS:21021675::"Denies"} Difficulty using hands for taps, buttons, cutlery, and/or writing: {ACTIONS;DENIES/REPORTS:21021675::"Denies"}   No Rheumatology ROS completed.   PMFS History:  Patient Active Problem List   Diagnosis Date Noted  . Fibromyalgia 05/25/2016  . Psoriasis 05/25/2016  . Primary osteoarthritis of both hands 05/25/2016  . DJD (degenerative joint disease), cervical 05/25/2016  . Adhesive capsulitis of right shoulder 05/25/2016  . Primary insomnia 05/25/2016  . History of diabetes mellitus 05/25/2016  . History of hypothyroidism 05/25/2016  . History of migraine 05/25/2016  . History of gastroesophageal reflux (GERD) 05/25/2016  . History of gastric bypass 05/25/2016  . History of sebaceous gland carcinoma 05/25/2016  . 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 Palpitations 12/28/2012  . Migraine, unspecified, without mention of intractable migraine without mention of status migrainosus 08/09/2012  . Myalgia and myositis, unspecified 02/09/2012  . Postlaminectomy syndrome, lumbar region 02/09/2012  . 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 Cramping of hands 11/10/2010  . Back pain 09/08/2010  . Hyperlipidemia 09/08/2010  . COLONIC POLYPS 08/27/2007  .  HYPOTHYROIDISM 08/27/2007  . OBESITY 08/27/2007  . EROSIVE GASTRITIS 08/27/2007  . ARTHRITIS 08/27/2007  . DIABETES MELLITUS, TYPE II 01/14/2007  . HYPERTENSION 01/14/2007  . GERD 01/14/2007  . HEADACHE 01/14/2007    Past Medical History:  Diagnosis Date  . Allergy   . Arthritis   . Asthma   . Cancer ([name])    sebaceous cancer dx. 2008  . Cancer of sebaceous glands   . Chronic headaches   . Depression   . Diabetes mellitus   . Fibromyalgia   . GERD (gastroesophageal reflux disease)   . Hyperlipidemia   . Hypertension    no per patient  . Migraines   . Obesity   . Pneumonia   . Shingles   . Tachycardia   . Thyroid disease     Family History  Problem Relation Age of Onset  . Arthritis Mother   . Cancer Mother        colon/skin  . Colon cancer Mother   . Cancer Father        lung/prostate  . Diabetes Paternal Uncle   . Colon cancer Paternal Uncle   . Arthritis Paternal Grandmother   . Heart disease Paternal Grandmother   . Arthritis Paternal Grandfather   . Cancer Paternal Grandfather        prostate  . Esophageal cancer Maternal Aunt    Past Surgical History:  Procedure Laterality Date  . ABDOMINAL EXPLORATION SURGERY    . BUNIONECTOMY    . CERVICAL LAMINECTOMY  2000  . ROTATOR CUFF REPAIR  01/2010   right  . ROUX-EN-Y PROCEDURE  2009  . TONSILLECTOMY     Social History   Social History Narrative  . No narrative on file     Objective:  Vital Signs: There were no vitals taken for this visit.   Physical Exam   Musculoskeletal Exam: ***  CDAI Exam: No CDAI exam completed.    Investigation: Findings:   June 2016:  Sed rate was 10, CK 41, IgA was 521, hep panel, SPEP, TB Gold, ANA, CCP, HLA-B27 were within normal limits.  Ultrasound was done in the past with negative for anti-inflammatory arthritis.    Imaging: No results found.  Speciality Comments: No specialty comments available.    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  No orders of the defined types were placed in this encounter.   Face-to-face time spent with patient was *** minutes. 50% of time was spent in counseling and coordination of care.  Follow-Up Instructions: No Follow-up on file.   Chayla Shands, RT  Note - This record has been created using [employer].  Chart creation errors have been sought, but may not always  have been located. Such creation errors do not reflect on  the standard of medical care.

## 2017-01-20 ENCOUNTER — Ambulatory Visit: Payer: BLUE CROSS/BLUE SHIELD | Admitting: Rheumatology

## 2017-05-06 LAB — HM PAP SMEAR: HM Pap smear: NORMAL

## 2017-11-20 ENCOUNTER — Other Ambulatory Visit: Payer: Self-pay

## 2017-11-20 ENCOUNTER — Encounter (HOSPITAL_COMMUNITY): Payer: Self-pay | Admitting: Emergency Medicine

## 2017-11-20 ENCOUNTER — Ambulatory Visit (HOSPITAL_COMMUNITY)
Admission: EM | Admit: 2017-11-20 | Discharge: 2017-11-20 | Disposition: A | Discharge status: Home / Self Care | Payer: Medicare Other | Attending: Internal Medicine | Admitting: Internal Medicine

## 2017-11-20 DIAGNOSIS — L298 Other pruritus: Secondary | ICD-10-CM

## 2017-11-20 DIAGNOSIS — L299 Pruritus, unspecified: Secondary | ICD-10-CM

## 2017-11-20 MED ORDER — PERMETHRIN 5 % EX CREA: TOPICAL_CREAM | CUTANEOUS | 0 refills | Status: DC

## 2017-11-20 NOTE — ED Triage Notes (Signed)
Generalized itching that started last Wednesday, no visible blisters or hives.  Both legs are itching

## 2017-11-20 NOTE — Discharge Instructions (Addendum)
Take Benadryl as instructed in the back of the box for itching.

## 2017-11-20 NOTE — ED Provider Notes (Addendum)
[place]    CSN: [id number] Arrival date & time: 11/20/17  1223     History   Chief Complaint Chief Complaint  Patient presents with  . Pruritis    HPI Rhonda Farley is a 67 y.o. female.   67y.o. female female presents with prutisis to bilateral lower extremities x3 days.  X. Condition is acute in nature. Condition is made better by nothing. Condition is made worse at night.  Patient denies any treatment prior to arrival at this facility.  She denies any rash or family members with similar signs and symptoms.  Patient states think I have a scabies.     Past Medical History:  Diagnosis Date  . Allergy   . Arthritis   . Asthma   . Cancer ([name])    sebaceous cancer dx. 2008  . Cancer of sebaceous glands   . Chronic headaches   . Depression   . Diabetes mellitus   . Fibromyalgia   . GERD (gastroesophageal reflux disease)   . Hyperlipidemia   . Hypertension    no per patient  . Migraines   . Obesity   . Pneumonia   . Shingles   . Tachycardia   . Thyroid disease     Patient Active Problem List   Diagnosis Date Noted  . Fibromyalgia 05/25/2016  . Psoriasis 05/25/2016  . Primary osteoarthritis of both hands 05/25/2016  . DJD (degenerative joint disease), cervical 05/25/2016  . Adhesive capsulitis of right shoulder 05/25/2016  . Primary insomnia 05/25/2016  . History of diabetes mellitus 05/25/2016  . History of hypothyroidism 05/25/2016  . History of migraine 05/25/2016  . History of gastroesophageal reflux (GERD) 05/25/2016  . History of gastric bypass 05/25/2016  . History of sebaceous gland carcinoma 05/25/2016  . 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 Palpitations 12/28/2012  . Migraine, unspecified, without mention of intractable migraine without mention of status migrainosus 08/09/2012  . Myalgia and myositis, unspecified 02/09/2012  . Postlaminectomy syndrome, lumbar region 02/09/2012  . 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 Cramping of  hands 11/10/2010  . Back pain 09/08/2010  . Hyperlipidemia 09/08/2010  . COLONIC POLYPS 08/27/2007  . HYPOTHYROIDISM 08/27/2007  . OBESITY 08/27/2007  . EROSIVE GASTRITIS 08/27/2007  . ARTHRITIS 08/27/2007  . DIABETES MELLITUS, TYPE II 01/14/2007  . HYPERTENSION 01/14/2007  . GERD 01/14/2007  . HEADACHE 01/14/2007    Past Surgical History:  Procedure Laterality Date  . ABDOMINAL EXPLORATION SURGERY    . BUNIONECTOMY    . CERVICAL LAMINECTOMY  2000  . ROTATOR CUFF REPAIR  01/2010   right  . ROUX-EN-Y PROCEDURE  2009  . TONSILLECTOMY      OB History   None      Home Medications    Prior to Admission medications   Medication Sig Start Date End Date Taking? Authorizing Provider  B Complex-C (SUPER B COMPLEX PO) Take by mouth daily.    [provider]  baclofen (LIORESAL) 10 MG tablet Take by mouth. 08/30/15   [provider]  budesonide-formoterol (SYMBICORT) 80-4.5 MCG/ACT inhaler Inhale 2 puffs into the lungs as needed. 12/03/11   Marletta Lor, MD  buPROPion (WELLBUTRIN XL) 150 MG 24 hr tablet Take 1 tablet (150 mg total) by mouth every morning. 04/28/12   Marletta Lor, MD  butalbital-acetaminophen-caffeine (FIORICET, ESGIC) [phone] MG per tablet Take 2 tablets by mouth as needed. 04/28/12   Marletta Lor, MD  cetirizine (ZYRTEC) 10 MG tablet Take 10 mg by mouth  daily.      [provider]  Coenzyme Q10 (CO Q 10 PO) Take by mouth daily.    [provider]  cyanocobalamin (COBAL-1000) 1000 MCG/ML injection Inject 1 mL (1,000 mcg total) into the muscle every 30 (thirty) days. PT may self adminster 03/07/13   Marletta Lor, MD  diclofenac sodium (VOLTAREN) 1 % GEL Apply 2 g topically 4 (four) times daily. 03/22/12   Prueter, Santiago Glad, PA-C  DULoxetine (CYMBALTA) 60 MG capsule Take 1 capsule (60 mg total) by mouth daily. 09/29/12   Marletta Lor, MD  esomeprazole (NEXIUM) 40 MG capsule Take 1 capsule (40 mg  total) by mouth 2 (two) times daily. 04/28/12   Marletta Lor, MD  estradiol (VIVELLE-DOT) 0.05 MG/24HR Place 1 patch onto the skin once a week.    [provider]  fish oil-omega-3 fatty acids 1000 MG capsule Take 1 g by mouth daily.      [provider]  gabapentin (NEURONTIN) 300 MG capsule Take 1 capsule (300 mg total) by mouth 3 (three) times daily. 2 tabs in AM, 1 afternoon, 2 at Bedtime 03/07/13   Marletta Lor, MD  GLUCOSAMINE HCL-MSM PO Take by mouth daily.    [provider]  insulin glargine (LANTUS) 100 UNIT/ML injection Inject into the skin at bedtime.    [provider]  Insulin Pen Needle 32G X 6 MM MISC 1 each by Does not apply route daily. 09/29/12   Marletta Lor, MD  LORazepam (ATIVAN) 1 MG tablet Take 1 tablet (1 mg total) by mouth daily as needed for anxiety. 05/31/13   Marletta Lor, MD  methocarbamol (ROBAXIN) 500 MG tablet Take 1 tablet (500 mg total) by mouth every 6 (six) hours as needed. 03/08/13   Marletta Lor, MD  montelukast (SINGULAIR) 10 MG tablet Take 1 tablet (10 mg total) by mouth at bedtime. 03/07/13 03/07/14  Marletta Lor, MD  Multiple Vitamin (MULTIVITAMIN) capsule Take 1 capsule by mouth daily.      [provider]  naratriptan (AMERGE) 2.5 MG tablet Take 1 tablet (2.5 mg total) by mouth as needed. Take one (1) tablet at onset of headache; if returns or does not resolve, may repeat after 4 hours; do not exceed five (5) mg in 24 hours. 12/03/11   Marletta Lor, MD  Olopatadine HCl (PATADAY) 0.2 % SOLN Apply to eye as needed.     [provider]  progesterone (PROMETRIUM) 100 MG capsule Take daily starting the first of each month for 10days 04/13/11   [provider]  propranolol (INDERAL) 10 MG tablet Take by mouth as needed. Take 1 tablet (10 mg total) by mouth 2 (two) times daily. As needed 10/18/12 10/18/13  [provider]  Saxagliptin-Metformin  (KOMBIGLYZE XR) 2.08-998 MG TB24 Take 1 tablet by mouth 2 (two) times daily. Patient not taking: Reported on 05/26/2016 03/27/14   Marletta Lor, MD  simvastatin (ZOCOR) 40 MG tablet Take 0.5 tablets (20 mg total) by mouth at bedtime. 03/07/13   Marletta Lor, MD  Thiamine HCl (VITAMIN B-1) 100 MG tablet Take 100 mg by mouth daily.      [provider]  traZODone (DESYREL) 50 MG tablet TAKE 1 TABLET EVERY NIGHT AT BEDTIME  - GENERIC FOR  DESYREL 10/19/16   Bo Merino, MD  valACYclovir (VALTREX) 500 MG tablet Take 1 tablet (500 mg total) by mouth 2 (two) times daily. 03/07/13   Marletta Lor,  MD    Family History Family History  Problem Relation Age of Onset  . Arthritis Mother   . Cancer Mother        colon/skin  . Colon cancer Mother   . Cancer Father        lung/prostate  . Diabetes Paternal Uncle   . Colon cancer Paternal Uncle   . Arthritis Paternal Grandmother   . Heart disease Paternal Grandmother   . Arthritis Paternal Grandfather   . Cancer Paternal Grandfather        prostate  . Esophageal cancer Maternal Aunt     Social History Social History   Tobacco Use  . Smoking status: Former Smoker    Last attempt to quit: 04/13/1977    Years since quitting: 40.6  . Smokeless tobacco: Never Used  Substance Use Topics  . Alcohol use: No  . Drug use: No     Allergies   Citrullus vulgaris   Review of Systems Review of Systems  Constitutional: Negative for chills and fever.  HENT: Negative for ear pain and sore throat.   Eyes: Negative for pain and visual disturbance.  Respiratory: Negative for cough and shortness of breath.   Cardiovascular: Negative for chest pain and palpitations.  Gastrointestinal: Negative for abdominal pain and vomiting.  Genitourinary: Negative for dysuria and hematuria.  Musculoskeletal: Negative for arthralgias and back pain.  Skin: Negative for color change and rash.       Itching bilateral legs.    Neurological: Negative for seizures and syncope.  All other systems reviewed and are negative.    Physical Exam Triage Vital Signs ED Triage Vitals  Enc Vitals Group     BP 11/20/17 1333 122/76     Pulse Rate 11/20/17 1333 95     Resp 11/20/17 1333 18     Temp 11/20/17 1333 99.2 F (37.3 C)     Temp Source 11/20/17 1333 Oral     SpO2 11/20/17 1333 99 %     Weight --      Height --      Head Circumference --      Peak Flow --      Pain Score 11/20/17 1327 0     Pain Loc --      Pain Edu? --      Excl. in [place]? --    No data found.  Updated Vital Signs BP 122/76 (BP Location: Left Arm)   Pulse 95   Temp 99.2 F (37.3 C) (Oral)   Resp 18   SpO2 99%   Visual Acuity Right Eye Distance:   Left Eye Distance:   Bilateral Distance:    Right Eye Near:   Left Eye Near:    Bilateral Near:     Physical Exam  Constitutional: She is oriented to person, place, and time. She appears well-developed and well-nourished.  HENT:  Head: Normocephalic and atraumatic.  Eyes: Conjunctivae are normal.  Neck: Normal range of motion.  Pulmonary/Chest: Effort normal.  Neurological: She is alert and oriented to person, place, and time.  Skin:  Multiple scratches to bilateral lower extremities.  No rash noted.  Psychiatric: She has a normal mood and affect.  Nursing note and vitals reviewed.    UC Treatments / Results  Labs (all labs ordered are listed, but only abnormal results are displayed) Labs Reviewed - No data to display  EKG None  Radiology No results found.  Procedures Procedures (including critical care time)  Medications Ordered in UC  Medications - No data to display  Initial Impression / Assessment and Plan / UC Course  I have reviewed the triage vital signs and the nursing notes.  Pertinent labs & imaging results that were available during my care of the patient were reviewed by me and considered in my medical decision making (see chart for details).       Final Clinical Impressions(s) / UC Diagnoses   Final diagnoses:  None   Discharge Instructions   None    ED Prescriptions    None     Controlled Substance Prescriptions [place] Controlled Substance Registry consulted? Not Applicable   Jacqualine Mau, NP 11/20/17 1410    Jacqualine Mau, NP 11/20/17 1414

## 2017-11-24 DIAGNOSIS — L821 Other seborrheic keratosis: Secondary | ICD-10-CM | POA: Diagnosis not present

## 2017-11-24 DIAGNOSIS — L820 Inflamed seborrheic keratosis: Secondary | ICD-10-CM | POA: Diagnosis not present

## 2017-11-24 DIAGNOSIS — L814 Other melanin hyperpigmentation: Secondary | ICD-10-CM | POA: Diagnosis not present

## 2017-11-24 DIAGNOSIS — D1801 Hemangioma of skin and subcutaneous tissue: Secondary | ICD-10-CM | POA: Diagnosis not present

## 2017-11-24 DIAGNOSIS — D485 Neoplasm of uncertain behavior of skin: Secondary | ICD-10-CM | POA: Diagnosis not present

## 2017-11-24 DIAGNOSIS — L400 Psoriasis vulgaris: Secondary | ICD-10-CM | POA: Diagnosis not present

## 2017-11-24 DIAGNOSIS — L570 Actinic keratosis: Secondary | ICD-10-CM | POA: Diagnosis not present

## 2017-11-24 DIAGNOSIS — D2271 Melanocytic nevi of right lower limb, including hip: Secondary | ICD-10-CM | POA: Diagnosis not present

## 2017-11-24 DIAGNOSIS — D2272 Melanocytic nevi of left lower limb, including hip: Secondary | ICD-10-CM | POA: Diagnosis not present

## 2017-11-24 DIAGNOSIS — D225 Melanocytic nevi of trunk: Secondary | ICD-10-CM | POA: Diagnosis not present

## 2017-12-19 DIAGNOSIS — E119 Type 2 diabetes mellitus without complications: Secondary | ICD-10-CM | POA: Diagnosis not present

## 2017-12-19 DIAGNOSIS — R300 Dysuria: Secondary | ICD-10-CM | POA: Diagnosis not present

## 2017-12-19 DIAGNOSIS — B373 Candidiasis of vulva and vagina: Secondary | ICD-10-CM | POA: Diagnosis not present

## 2017-12-19 DIAGNOSIS — N390 Urinary tract infection, site not specified: Secondary | ICD-10-CM | POA: Diagnosis not present

## 2018-01-07 DIAGNOSIS — R300 Dysuria: Secondary | ICD-10-CM | POA: Diagnosis not present

## 2018-01-07 DIAGNOSIS — N390 Urinary tract infection, site not specified: Secondary | ICD-10-CM | POA: Diagnosis not present

## 2018-01-31 DIAGNOSIS — N390 Urinary tract infection, site not specified: Secondary | ICD-10-CM | POA: Diagnosis not present

## 2018-01-31 DIAGNOSIS — Z23 Encounter for immunization: Secondary | ICD-10-CM | POA: Diagnosis not present

## 2018-03-04 DIAGNOSIS — L400 Psoriasis vulgaris: Secondary | ICD-10-CM | POA: Diagnosis not present

## 2018-03-09 DIAGNOSIS — Z79899 Other long term (current) drug therapy: Secondary | ICD-10-CM | POA: Diagnosis not present

## 2018-04-05 DIAGNOSIS — N3000 Acute cystitis without hematuria: Secondary | ICD-10-CM | POA: Diagnosis not present

## 2018-04-05 DIAGNOSIS — R300 Dysuria: Secondary | ICD-10-CM | POA: Diagnosis not present

## 2018-04-08 DIAGNOSIS — Z79899 Other long term (current) drug therapy: Secondary | ICD-10-CM | POA: Diagnosis not present

## 2018-06-17 ENCOUNTER — Ambulatory Visit (HOSPITAL_COMMUNITY)
Admission: EM | Admit: 2018-06-17 | Discharge: 2018-06-17 | Disposition: A | Discharge status: Home / Self Care | Payer: Medicare Other | Attending: Family Medicine | Admitting: Family Medicine

## 2018-06-17 ENCOUNTER — Encounter (HOSPITAL_COMMUNITY): Payer: Self-pay

## 2018-06-17 DIAGNOSIS — N390 Urinary tract infection, site not specified: Secondary | ICD-10-CM | POA: Diagnosis not present

## 2018-06-17 DIAGNOSIS — R5383 Other fatigue: Secondary | ICD-10-CM

## 2018-06-17 DIAGNOSIS — E860 Dehydration: Secondary | ICD-10-CM | POA: Diagnosis not present

## 2018-06-17 LAB — POCT URINALYSIS DIP (DEVICE)
BILIRUBIN URINE: NEGATIVE
Glucose, UA: 500 mg/dL — AB
Ketones, ur: NEGATIVE mg/dL
Nitrite: POSITIVE — AB
PH: 6.5 (ref 5.0–8.0)
PROTEIN: 100 mg/dL — AB
SPECIFIC GRAVITY, URINE: 1.01 (ref 1.005–1.030)
Urobilinogen, UA: 1 mg/dL (ref 0.0–1.0)

## 2018-06-17 LAB — CBC WITH DIFFERENTIAL/PLATELET
Abs Immature Granulocytes: 0.18 10*3/uL — ABNORMAL HIGH (ref 0.00–0.07)
Basophils Absolute: 0.1 10*3/uL (ref 0.0–0.1)
Basophils Relative: 0 %
EOS ABS: 0.2 10*3/uL (ref 0.0–0.5)
Eosinophils Relative: 1 %
HCT: 40.5 % (ref 36.0–46.0)
Hemoglobin: 13 g/dL (ref 12.0–15.0)
IMMATURE GRANULOCYTES: 1 %
LYMPHS PCT: 18 %
Lymphs Abs: 2.2 10*3/uL (ref 0.7–4.0)
MCH: 29.9 pg (ref 26.0–34.0)
MCHC: 32.1 g/dL (ref 30.0–36.0)
MCV: 93.1 fL (ref 80.0–100.0)
Monocytes Absolute: 0.7 10*3/uL (ref 0.1–1.0)
Monocytes Relative: 5 %
Neutro Abs: 9.3 10*3/uL — ABNORMAL HIGH (ref 1.7–7.7)
Neutrophils Relative %: 75 %
Platelets: 501 10*3/uL — ABNORMAL HIGH (ref 150–400)
RBC: 4.35 MIL/uL (ref 3.87–5.11)
RDW: 14.5 % (ref 11.5–15.5)
WBC: 12.6 10*3/uL — AB (ref 4.0–10.5)
nRBC: 0 % (ref 0.0–0.2)

## 2018-06-17 LAB — COMPREHENSIVE METABOLIC PANEL
ALBUMIN: 2.7 g/dL — AB (ref 3.5–5.0)
ALT: 34 U/L (ref 0–44)
ANION GAP: 12 (ref 5–15)
AST: 29 U/L (ref 15–41)
Alkaline Phosphatase: 91 U/L (ref 38–126)
BILIRUBIN TOTAL: 0.5 mg/dL (ref 0.3–1.2)
CHLORIDE: 93 mmol/L — AB (ref 98–111)
CO2: 26 mmol/L (ref 22–32)
Calcium: 9 mg/dL (ref 8.9–10.3)
Creatinine, Ser: 0.87 mg/dL (ref 0.44–1.00)
GFR calc Af Amer: >60 mL/min (ref >60)
GFR calc non Af Amer: >60 mL/min (ref >60)
Glucose, Bld: 319 mg/dL — ABNORMAL HIGH (ref 70–99)
POTASSIUM: 3.1 mmol/L — AB (ref 3.5–5.1)
SODIUM: 131 mmol/L — AB (ref 135–145)
Total Protein: 7.7 g/dL (ref 6.5–8.1)

## 2018-06-17 MED ORDER — CEFTRIAXONE SODIUM 1 G IJ SOLR
1.0000 g | Freq: Once | INTRAMUSCULAR | Status: AC
  Administered 2018-06-17: 1 g via INTRAMUSCULAR

## 2018-06-17 MED ORDER — CEFTRIAXONE SODIUM 1 G IJ SOLR
INTRAMUSCULAR | Status: AC
  Filled 2018-06-17: qty 10

## 2018-06-17 MED ORDER — SODIUM CHLORIDE 0.9 % IV SOLN
Freq: Once | INTRAVENOUS | Status: AC
  Administered 2018-06-17: 12:00:00 via INTRAVENOUS

## 2018-06-17 MED ORDER — CEPHALEXIN 500 MG PO CAPS: 500.0000 mg | ORAL_CAPSULE | Freq: Two times a day (BID) | ORAL | 0 refills | Status: AC

## 2018-06-17 NOTE — Discharge Instructions (Addendum)
We are treating you for a urinary tract infection.  Antibiotic injection given here in clinic along with antibiotics to take at home.  Keflex twice a day for 7 days.  Make sure you are staying hydrated. We gave you a liter of fluid here which will hopefully help rehydrate you. If your symptoms continue or worsen despite treatment please follow-up here or go to the ER.

## 2018-06-17 NOTE — ED Provider Notes (Signed)
[place]    CSN: [id number] Arrival date & time: 06/17/18  1001     History   Chief Complaint Chief Complaint  Patient presents with  . Migraine  . Fatigue  . Anorexia    HPI Rhonda Farley is a 68 y.o. female.   Patient is a 68 year old female with past medical history of allergy, arthritis, asthma, migraines, diabetes, fibromyalgia, hypertension, hyperlipidemia. She presents with migraine, fatigue, anorexia, weight loss. She reports that she has lost 14 pounds.  The migraine started approximately 1 week ago and lasted for 5 days.  She reports that she could not get out of the bed and was not eating or drinking.  The migraine then subsided.  She has not had a headache in 4 days.  She is left with dry mouth, fatigue, loss of appetite.  The symptoms have been constant.  She has been drinking fluids .At the time when she was having a migraine she was also having night sweats.  She takes naratriptan and butalbital for her headaches.  She has had some mild dizziness, more with going from sitting to standing position.  Denies any cough, congestion, rhinorrhea, nausea, vomiting, diarrhea.  Denies any sore throat or ear pain.  She has had some mild right flank tenderness.  Denies any dysuria, hematuria, urinary frequency.  ROS per HPI      Past Medical History:  Diagnosis Date  . Allergy   . Arthritis   . Asthma   . Cancer ([name])    sebaceous cancer dx. 2008  . Cancer of sebaceous glands   . Chronic headaches   . Depression   . Diabetes mellitus   . Fibromyalgia   . GERD (gastroesophageal reflux disease)   . Hyperlipidemia   . Hypertension    no per patient  . Migraines   . Obesity   . Pneumonia   . Shingles   . Tachycardia   . Thyroid disease     Patient Active Problem List   Diagnosis Date Noted  . Fibromyalgia 05/25/2016  . Psoriasis 05/25/2016  . Primary osteoarthritis of both hands 05/25/2016  . DJD (degenerative joint disease), cervical  05/25/2016  . Adhesive capsulitis of right shoulder 05/25/2016  . Primary insomnia 05/25/2016  . History of diabetes mellitus 05/25/2016  . History of hypothyroidism 05/25/2016  . History of migraine 05/25/2016  . History of gastroesophageal reflux (GERD) 05/25/2016  . History of gastric bypass 05/25/2016  . History of sebaceous gland carcinoma 05/25/2016  . 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 Palpitations 12/28/2012  . Migraine, unspecified, without mention of intractable migraine without mention of status migrainosus 08/09/2012  . Myalgia and myositis, unspecified 02/09/2012  . Postlaminectomy syndrome, lumbar region 02/09/2012  . 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 Cramping of hands 11/10/2010  . Back pain 09/08/2010  . Hyperlipidemia 09/08/2010  . COLONIC POLYPS 08/27/2007  . HYPOTHYROIDISM 08/27/2007  . OBESITY 08/27/2007  . EROSIVE GASTRITIS 08/27/2007  . ARTHRITIS 08/27/2007  . DIABETES MELLITUS, TYPE II 01/14/2007  . HYPERTENSION 01/14/2007  . GERD 01/14/2007  . HEADACHE 01/14/2007    Past Surgical History:  Procedure Laterality Date  . ABDOMINAL EXPLORATION SURGERY    . BUNIONECTOMY    . CERVICAL LAMINECTOMY  2000  . ROTATOR CUFF REPAIR  01/2010   right  . ROUX-EN-Y PROCEDURE  2009  . TONSILLECTOMY      OB History   No obstetric history on file.      Home Medications  Prior to Admission medications   Medication Sig Start Date End Date Taking? Authorizing Provider  B Complex-C (SUPER B COMPLEX PO) Take by mouth daily.    [provider]  baclofen (LIORESAL) 10 MG tablet Take by mouth. 08/30/15   [provider]  budesonide-formoterol (SYMBICORT) 80-4.5 MCG/ACT inhaler Inhale 2 puffs into the lungs as needed. 12/03/11   Marletta Lor, MD  buPROPion (WELLBUTRIN XL) 150 MG 24 hr tablet Take 1 tablet (150 mg total) by mouth every morning. 04/28/12   Marletta Lor, MD  butalbital-acetaminophen-caffeine (FIORICET, ESGIC)  [phone] MG per tablet Take 2 tablets by mouth as needed. 04/28/12   Marletta Lor, MD  cephALEXin (KEFLEX) 500 MG capsule Take 1 capsule (500 mg total) by mouth 2 (two) times daily for 7 days. 06/17/18 06/24/18  Loura Halt A, NP  cetirizine (ZYRTEC) 10 MG tablet Take 10 mg by mouth daily.      [provider]  Coenzyme Q10 (CO Q 10 PO) Take by mouth daily.    [provider]  cyanocobalamin (COBAL-1000) 1000 MCG/ML injection Inject 1 mL (1,000 mcg total) into the muscle every 30 (thirty) days. PT may self adminster 03/07/13   Marletta Lor, MD  diclofenac sodium (VOLTAREN) 1 % GEL Apply 2 g topically 4 (four) times daily. 03/22/12   Prueter, Santiago Glad, PA-C  DULoxetine (CYMBALTA) 60 MG capsule Take 1 capsule (60 mg total) by mouth daily. 09/29/12   Marletta Lor, MD  esomeprazole (NEXIUM) 40 MG capsule Take 1 capsule (40 mg total) by mouth 2 (two) times daily. 04/28/12   Marletta Lor, MD  estradiol (VIVELLE-DOT) 0.05 MG/24HR Place 1 patch onto the skin once a week.    [provider]  fish oil-omega-3 fatty acids 1000 MG capsule Take 1 g by mouth daily.      [provider]  gabapentin (NEURONTIN) 300 MG capsule Take 1 capsule (300 mg total) by mouth 3 (three) times daily. 2 tabs in AM, 1 afternoon, 2 at Bedtime 03/07/13   Marletta Lor, MD  GLUCOSAMINE HCL-MSM PO Take by mouth daily.    [provider]  insulin glargine (LANTUS) 100 UNIT/ML injection Inject into the skin at bedtime.    [provider]  Insulin Pen Needle 32G X 6 MM MISC 1 each by Does not apply route daily. 09/29/12   Marletta Lor, MD  LORazepam (ATIVAN) 1 MG tablet Take 1 tablet (1 mg total) by mouth daily as needed for anxiety. 05/31/13   Marletta Lor, MD  methocarbamol (ROBAXIN) 500 MG tablet Take 1 tablet (500 mg total) by mouth every 6 (six) hours as needed. 03/08/13   Marletta Lor, MD  montelukast (SINGULAIR) 10 MG tablet  Take 1 tablet (10 mg total) by mouth at bedtime. 03/07/13 03/07/14  Marletta Lor, MD  Multiple Vitamin (MULTIVITAMIN) capsule Take 1 capsule by mouth daily.      [provider]  naratriptan (AMERGE) 2.5 MG tablet Take 1 tablet (2.5 mg total) by mouth as needed. Take one (1) tablet at onset of headache; if returns or does not resolve, may repeat after 4 hours; do not exceed five (5) mg in 24 hours. 12/03/11   Marletta Lor, MD  Olopatadine HCl (PATADAY) 0.2 % SOLN Apply to eye as needed.     [provider]  permethrin (ELIMITE) 5 % cream Apply to affected area once 11/20/17   Jacqualine Mau, NP  progesterone ([place])  100 MG capsule Take daily starting the first of each month for 10days 04/13/11   [provider]  propranolol (INDERAL) 10 MG tablet Take by mouth as needed. Take 1 tablet (10 mg total) by mouth 2 (two) times daily. As needed 10/18/12 10/18/13  [provider]  Saxagliptin-Metformin (KOMBIGLYZE XR) 2.08-998 MG TB24 Take 1 tablet by mouth 2 (two) times daily. Patient not taking: Reported on 05/26/2016 03/27/14   Marletta Lor, MD  simvastatin (ZOCOR) 40 MG tablet Take 0.5 tablets (20 mg total) by mouth at bedtime. 03/07/13   Marletta Lor, MD  Thiamine HCl (VITAMIN B-1) 100 MG tablet Take 100 mg by mouth daily.      [provider]  traZODone (DESYREL) 50 MG tablet TAKE 1 TABLET EVERY NIGHT AT BEDTIME  - GENERIC FOR  DESYREL 10/19/16   Bo Merino, MD  valACYclovir (VALTREX) 500 MG tablet Take 1 tablet (500 mg total) by mouth 2 (two) times daily. 03/07/13   Marletta Lor, MD    Family History Family History  Problem Relation Age of Onset  . Arthritis Mother   . Cancer Mother        colon/skin  . Colon cancer Mother   . Cancer Father        lung/prostate  . Diabetes Paternal Uncle   . Colon cancer Paternal Uncle   . Arthritis Paternal Grandmother   . Heart disease Paternal Grandmother   .  Arthritis Paternal Grandfather   . Cancer Paternal Grandfather        prostate  . Esophageal cancer Maternal Aunt     Social History Social History   Tobacco Use  . Smoking status: Former Smoker    Last attempt to quit: 04/13/1977    Years since quitting: 41.2  . Smokeless tobacco: Never Used  Substance Use Topics  . Alcohol use: No  . Drug use: No     Allergies   Citrullus vulgaris   Review of Systems Review of Systems   Physical Exam Triage Vital Signs ED Triage Vitals  Enc Vitals Group     BP 06/17/18 1038 115/79     Pulse Rate 06/17/18 1038 (!) 111     Resp 06/17/18 1038 18     Temp 06/17/18 1038 97.9 F (36.6 C)     Temp Source 06/17/18 1038 Oral     SpO2 06/17/18 1038 99 %     Weight --      Height --      Head Circumference --      Peak Flow --      Pain Score 06/17/18 1039 2     Pain Loc --      Pain Edu? --      Excl. in [place]? --    No data found.  Updated Vital Signs BP 115/79 (BP Location: Right Arm)   Pulse (!) 111   Temp 97.9 F (36.6 C) (Oral)   Resp 18   SpO2 99%   Visual Acuity Right Eye Distance:   Left Eye Distance:   Bilateral Distance:    Right Eye Near:   Left Eye Near:    Bilateral Near:     Physical Exam Vitals signs and nursing note reviewed.  Constitutional:      General: She is not in acute distress.    Appearance: She is normal weight. She is ill-appearing. She is not toxic-appearing or diaphoretic.  HENT:     Head: Normocephalic and atraumatic.  Right Ear: Tympanic membrane and ear canal normal.     Left Ear: Tympanic membrane and ear canal normal.     Nose: Nose normal.     Mouth/Throat:     Pharynx: Oropharynx is clear.  Eyes:     Conjunctiva/sclera: Conjunctivae normal.  Neck:     Musculoskeletal: Normal range of motion.  Cardiovascular:     Rate and Rhythm: Normal rate and regular rhythm.     Pulses: Normal pulses.     Heart sounds: Normal heart sounds.  Abdominal:     Palpations: Abdomen is soft.       Tenderness: There is no abdominal tenderness. There is right CVA tenderness.  Musculoskeletal: Normal range of motion.  Skin:    General: Skin is warm and dry.     Findings: No rash.  Neurological:     Mental Status: She is alert.  Psychiatric:        Mood and Affect: Mood normal.      UC Treatments / Results  Labs (all labs ordered are listed, but only abnormal results are displayed) Labs Reviewed  CBC WITH DIFFERENTIAL/PLATELET - Abnormal; Notable for the following components:      Result Value   WBC 12.6 (*)    Platelets 501 (*)    Neutro Abs 9.3 (*)    Abs Immature Granulocytes 0.18 (*)    All other components within normal limits  COMPREHENSIVE METABOLIC PANEL - Abnormal; Notable for the following components:   Sodium 131 (*)    Potassium 3.1 (*)    Chloride 93 (*)    Glucose, Bld 319 (*)    BUN <5 (*)    Albumin 2.7 (*)    All other components within normal limits  POCT URINALYSIS DIP (DEVICE) - Abnormal; Notable for the following components:   Glucose, UA 500 (*)    Hgb urine dipstick LARGE (*)    Protein, ur 100 (*)    Nitrite POSITIVE (*)    Leukocytes,Ua SMALL (*)    All other components within normal limits  URINE CULTURE    EKG None  Radiology No results found.  Procedures Procedures (including critical care time)  Medications Ordered in UC Medications  0.9 %  sodium chloride infusion ( Intravenous Stopped 06/17/18 1239)  cefTRIAXone (ROCEPHIN) injection 1 g (1 g Intramuscular Given 06/17/18 1239)    Initial Impression / Assessment and Plan / UC Course  I have reviewed the triage vital signs and the nursing notes.  Pertinent labs & imaging results that were available during my care of the patient were reviewed by me and considered in my medical decision making (see chart for details).     Patient is a 68 year old female that presents today with overall fatigue, weight loss, weakness and some mild right flank tenderness. She did have a  migraine for 5 days which resolved. She did appear dehydrated with dry mucous membranes on exam Otherwise her exam was normal Her lab work here revealed dehydration and elevated white blood cell count Urine was positive for a urinary tract infection Most likely the UTI or possible pyelonephritis that is causing her symptoms.  She does not have any fever but she is tachycardic. We gave her a normal saline bolus here in clinic to rehydrate her IM Rocephin given for antibiotic coverage Sending home with prescription for Keflex for further coverage of urinary tract infection Recommended she increase her water intake and stay hydrated to flush the kidneys. Strict precautions and instructions that  if her symptoms continue or worsen despite treatment she will need to follow-up in the ER. Sending urine for culture Would like to place patient on Bactrim but she reports she is resistant to sulfa medications Final Clinical Impressions(s) / UC Diagnoses   Final diagnoses:  Lower urinary tract infectious disease     Discharge Instructions     We are treating you for a urinary tract infection.  Antibiotic injection given here in clinic along with antibiotics to take at home.  Keflex twice a day for 7 days.  Make sure you are staying hydrated. We gave you a liter of fluid here which will hopefully help rehydrate you. If your symptoms continue or worsen despite treatment please follow-up here or go to the ER.     ED Prescriptions    Medication Sig Dispense Auth. Provider   cephALEXin (KEFLEX) 500 MG capsule Take 1 capsule (500 mg total) by mouth 2 (two) times daily for 7 days. 14 capsule Loura Halt A, NP     Controlled Substance Prescriptions [place] Controlled Substance Registry consulted? Not Applicable   Orvan July, NP 06/17/18 1402

## 2018-06-17 NOTE — ED Triage Notes (Signed)
Pt present a migraine for 5 days. Pt states the migraine is very severe with some fatigue and no appetite

## 2018-06-19 LAB — URINE CULTURE: Culture: >=100000 — AB

## 2018-06-20 ENCOUNTER — Telehealth (HOSPITAL_COMMUNITY): Payer: Self-pay | Admitting: Emergency Medicine

## 2018-06-20 MED ORDER — FOSFOMYCIN TROMETHAMINE 3 G PO PACK: 3.0000 g | PACK | Freq: Once | ORAL | 0 refills | Status: AC

## 2018-06-20 MED ORDER — FLUCONAZOLE 150 MG PO TABS: 150.0000 mg | ORAL_TABLET | Freq: Once | ORAL | 0 refills | Status: AC

## 2018-06-20 NOTE — Telephone Encounter (Signed)
Results reviewed by Dr. Lanny Cramp. Dr. Lanny Cramp spoke with patient on the phone. Will send in fosfomycin and diflucan to patients preferred pharmacy.

## 2018-07-11 ENCOUNTER — Ambulatory Visit: Payer: Medicare Other | Admitting: Family Medicine

## 2018-07-12 ENCOUNTER — Encounter: Payer: Self-pay | Admitting: Family Medicine

## 2018-07-12 ENCOUNTER — Other Ambulatory Visit: Payer: Self-pay

## 2018-07-12 ENCOUNTER — Ambulatory Visit (INDEPENDENT_AMBULATORY_CARE_PROVIDER_SITE_OTHER): Payer: Medicare Other | Admitting: Family Medicine

## 2018-07-12 VITALS — BP 120/68 | HR 85 | Temp 98.7°F | Wt 154.4 lb

## 2018-07-12 DIAGNOSIS — F419 Anxiety disorder, unspecified: Secondary | ICD-10-CM | POA: Diagnosis not present

## 2018-07-12 DIAGNOSIS — M549 Dorsalgia, unspecified: Secondary | ICD-10-CM | POA: Diagnosis not present

## 2018-07-12 DIAGNOSIS — J45909 Unspecified asthma, uncomplicated: Secondary | ICD-10-CM | POA: Insufficient documentation

## 2018-07-12 DIAGNOSIS — F5101 Primary insomnia: Secondary | ICD-10-CM

## 2018-07-12 DIAGNOSIS — J302 Other seasonal allergic rhinitis: Secondary | ICD-10-CM

## 2018-07-12 DIAGNOSIS — B009 Herpesviral infection, unspecified: Secondary | ICD-10-CM

## 2018-07-12 DIAGNOSIS — E119 Type 2 diabetes mellitus without complications: Secondary | ICD-10-CM

## 2018-07-12 DIAGNOSIS — D485 Neoplasm of uncertain behavior of skin: Secondary | ICD-10-CM | POA: Diagnosis not present

## 2018-07-12 DIAGNOSIS — E538 Deficiency of other specified B group vitamins: Secondary | ICD-10-CM | POA: Diagnosis not present

## 2018-07-12 DIAGNOSIS — E1169 Type 2 diabetes mellitus with other specified complication: Secondary | ICD-10-CM | POA: Diagnosis not present

## 2018-07-12 DIAGNOSIS — F325 Major depressive disorder, single episode, in full remission: Secondary | ICD-10-CM | POA: Diagnosis not present

## 2018-07-12 DIAGNOSIS — J4520 Mild intermittent asthma, uncomplicated: Secondary | ICD-10-CM | POA: Diagnosis not present

## 2018-07-12 DIAGNOSIS — M797 Fibromyalgia: Secondary | ICD-10-CM

## 2018-07-12 DIAGNOSIS — E785 Hyperlipidemia, unspecified: Secondary | ICD-10-CM

## 2018-07-12 DIAGNOSIS — G43809 Other migraine, not intractable, without status migrainosus: Secondary | ICD-10-CM

## 2018-07-12 LAB — POCT GLYCOSYLATED HEMOGLOBIN (HGB A1C): Hemoglobin A1C: 7.6 % — AB (ref 4.0–5.6)

## 2018-07-12 LAB — VITAMIN B12: Vitamin B-12: 538 pg/mL (ref 211–911)

## 2018-07-12 MED ORDER — ONDANSETRON 4 MG PO TBDP: 4.0000 mg | ORAL_TABLET | Freq: Three times a day (TID) | ORAL | 0 refills | Status: DC | PRN

## 2018-07-12 MED ORDER — SIMVASTATIN 40 MG PO TABS: 40.0000 mg | ORAL_TABLET | ORAL | 1 refills | Status: DC

## 2018-07-12 MED ORDER — GABAPENTIN 300 MG PO CAPS: 600.0000 mg | ORAL_CAPSULE | Freq: Every day | ORAL | 1 refills | Status: DC

## 2018-07-12 MED ORDER — METFORMIN HCL ER 500 MG PO TB24: 500.0000 mg | ORAL_TABLET | Freq: Every day | ORAL | 3 refills | Status: DC

## 2018-07-12 NOTE — Assessment & Plan Note (Signed)
Stable.  Continue trazodone 100 mg nightly as needed.

## 2018-07-12 NOTE — Assessment & Plan Note (Signed)
Stable.  Continue management per dermatology and gastroenterology.

## 2018-07-12 NOTE — Assessment & Plan Note (Signed)
Continue B12 replacement.  Check B12 level today with blood draw.

## 2018-07-12 NOTE — Assessment & Plan Note (Signed)
Well-controlled.  Continue Claritin/Zyrtec and Pataday as needed.

## 2018-07-12 NOTE — Assessment & Plan Note (Signed)
Well-controlled.  Continue Symbicort as needed.

## 2018-07-12 NOTE — Assessment & Plan Note (Signed)
Stable.  Continue simvastatin 40mg  every other day.

## 2018-07-12 NOTE — Progress Notes (Signed)
Chief Complaint:  Rhonda Farley is a 68 y.o. female who presents today with a chief complaint of T2DM and to establish care.   Assessment/Plan:  Asthma Well-controlled.  Continue Symbicort as needed.  Seasonal allergies Well-controlled.  Continue Claritin/Zyrtec and Pataday as needed.  Type 2 diabetes mellitus without complication ([name]) D6L 7.6.  Her reported blood sugars are very labile with symptomatic lows into the 40s as well as highs into the 400s.  It is not clear why she would have such labile blood pressures despite only being on 12 units of Lantus and being a type II diabetic.  Will check C-peptide to see if she has any endogenous insulin production.  Told patient that we would aim for reasonable control without having dangerous lows.  We will add on metformin 500 mg daily.  Check C-peptide today as well.  Follow-up with me in 3 months.  Primary insomnia Stable.  Continue trazodone 100 mg nightly as needed.  Migraine Stable.  No red flags.  Continue Fioricet and naratriptan as needed.  Muir-Torre syndrome Stable.  Continue management per dermatology and gastroenterology.  history of occular herpes Continue Valtrex 500 mg twice daily.  Fibromyalgia Stable.  Continue gabapentin 600 mg nightly.  Dyslipidemia associated with type 2 diabetes mellitus ([name]) Stable.  Continue simvastatin 40mg  every other day.  Depression, major, single episode, complete remission ([name]) Stable.  Continue Cymbalta 60 mg daily and Wellbutrin 150 mg daily.  Back pain Stable.  Continue Voltaren as needed.  B12 deficiency s/p gastric bypass Continue B12 replacement.  Check B12 level today with blood draw.  Anxiety Stable.  Continue Cymbalta 60 mg daily.    Subjective:  HPI:  Her chronic medical conditions are outlined below:    # T2DM -On Lantus 12 units daily.  Tolerating well. -Blood sugars are very labile.  Highs in the 400s.  She has had some symptomatic lows in the 42s.  No  clear reason or precipitating events for lows or highs.  Fasting sugars usually in the low 200s. -She has been on several other medications in the past including metformin, Januvia, and Invokana that she has   # Dyslipidemia - On simvastatin 40 mg every other day and tolerating well. - ROS: No reported myalgias.  # Migraines - Takes Fioricet and/or naratriptan as needed. - No current symptoms.  # Depression / Anxiety / Insomnia - On wellbutrin 150mg  daily, cymbalta 60mg  daily, and trazodone 100mg  nightly.  -Symptoms are well controlled. -ROS: No reported SI or HI.  # Asthma / Allergies  - On Symbicort, Zyrtec, and Pataday seasonally as needed.  Symptoms are well controlled.  #Fibromyalgia - On gabapentin 600 mg nightly and tolerating well.  #History of occular herpes and herpes meningitis - On suppressive therapy with Valtrex 500mg  twice daily.  #Chronic back pain -Takes voltaren gel as needed.  # B12 Deficiency S/p gastric bypass -On B12 injections and tolerating well.  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 - Gets routine mammograms  ROS: Per HPI, otherwise a complete review of systems was negative.   PMH:  The following were reviewed and entered/updated in epic: Past Medical History:  Diagnosis Date   Allergy    Arthritis    Asthma    Cancer ([place])    sebaceous cancer dx. 2008   Cancer of sebaceous glands    Chronic headaches    Depression    Diabetes mellitus    Fibromyalgia  GERD (gastroesophageal reflux disease)    Hyperlipidemia    Hypertension    no per patient   Migraines    Obesity    Pneumonia    Shingles    Tachycardia    Thyroid disease    Patient Active Problem List   Diagnosis Date Noted   Type 2 diabetes mellitus without complication ([place]) 93/79/0240   B12 deficiency s/p gastric bypass 07/12/2018   Depression, major, single episode, complete  remission ([place]) 07/12/2018   Anxiety 07/12/2018   Asthma 07/12/2018   Seasonal allergies 07/12/2018   Fibromyalgia 05/25/2016   Primary insomnia 05/25/2016   history of occular herpes 05/25/2016   Palpitations 12/28/2012   Migraine 08/09/2012   Muir-Torre syndrome 12/09/2011   Back pain 09/08/2010   Dyslipidemia associated with type 2 diabetes mellitus ([place]) 09/08/2010   Past Surgical History:  Procedure Laterality Date   ABDOMINAL EXPLORATION SURGERY     BUNIONECTOMY     CERVICAL LAMINECTOMY  2000   ROTATOR CUFF REPAIR  01/2010   right   ROUX-EN-Y PROCEDURE  2009   TONSILLECTOMY      Family History  Problem Relation Age of Onset   Arthritis Mother    Cancer Mother        colon/skin   Colon cancer Mother    Cancer Father        lung/prostate   Diabetes Paternal Uncle    Colon cancer Paternal Uncle    Arthritis Paternal Grandmother    Heart disease Paternal Grandmother    Arthritis Paternal Grandfather    Cancer Paternal Grandfather        prostate   Esophageal cancer Maternal Aunt     Medications- reviewed and updated Current Outpatient Medications  Medication Sig Dispense Refill   B Complex-C (SUPER B COMPLEX PO) Take by mouth daily.     budesonide-formoterol (SYMBICORT) 80-4.5 MCG/ACT inhaler Inhale 2 puffs into the lungs as needed. 1 Inhaler 3   buPROPion (WELLBUTRIN XL) 150 MG 24 hr tablet Take 1 tablet (150 mg total) by mouth every morning. 90 tablet 3   butalbital-acetaminophen-caffeine (FIORICET, ESGIC) 50-325-40 MG per tablet Take 2 tablets by mouth as needed. 90 tablet 3   cetirizine (ZYRTEC) 10 MG tablet Take 10 mg by mouth daily.       Coenzyme Q10 (CO Q 10 PO) Take by mouth daily.     cyanocobalamin (COBAL-1000) 1000 MCG/ML injection Inject 1 mL (1,000 mcg total) into the muscle every 30 (thirty) days. PT may self adminster 10 mL 3   diclofenac sodium (VOLTAREN) 1 % GEL Apply 2 g topically 4 (four) times daily. 2 Tube  2   DULoxetine (CYMBALTA) 60 MG capsule Take 1 capsule (60 mg total) by mouth daily. 90 capsule 3   fish oil-omega-3 fatty acids 1000 MG capsule Take 1 g by mouth daily.       gabapentin (NEURONTIN) 300 MG capsule Take 2 capsules (600 mg total) by mouth at bedtime. 2 tabs in AM, 1 afternoon, 2 at Bedtime 450 capsule 1   insulin glargine (LANTUS) 100 UNIT/ML injection Inject into the skin at bedtime.     Insulin Pen Needle 32G X 6 MM MISC 1 each by Does not apply route daily. 100 each 2   Multiple Vitamin (MULTIVITAMIN) capsule Take 1 capsule by mouth daily.       naratriptan (AMERGE) 2.5 MG tablet Take 1 tablet (2.5 mg total) by mouth as needed. Take one (1) tablet at onset of headache; if  returns or does not resolve, may repeat after 4 hours; do not exceed five (5) mg in 24 hours. 10 tablet 3   Olopatadine HCl (PATADAY) 0.2 % SOLN Apply to eye as needed.      simvastatin (ZOCOR) 40 MG tablet Take 1 tablet (40 mg total) by mouth every other day. 90 tablet 1   traZODone (DESYREL) 50 MG tablet TAKE 1 TABLET EVERY NIGHT AT BEDTIME  - GENERIC FOR  DESYREL 90 tablet 0   valACYclovir (VALTREX) 500 MG tablet Take 1 tablet (500 mg total) by mouth 2 (two) times daily. 180 tablet 3   metFORMIN (GLUCOPHAGE XR) 500 MG 24 hr tablet Take 1 tablet (500 mg total) by mouth daily with breakfast. 90 tablet 3   ondansetron (ZOFRAN ODT) 4 MG disintegrating tablet Take 1 tablet (4 mg total) by mouth every 8 (eight) hours as needed for nausea or vomiting. 20 tablet 0   No current facility-administered medications for this visit.     Allergies-reviewed and updated Allergies  Allergen Reactions   Citrullus Vulgaris Anaphylaxis    Social History   Socioeconomic History   Marital status: Married    Spouse name: Not on file   Number of children: Not on file   Years of education: Not on file   Highest education level: Not on file  Occupational History   Not on file  Social Needs   Financial  resource strain: Not on file   Food insecurity:    Worry: Not on file    Inability: Not on file   Transportation needs:    Medical: Not on file    Non-medical: Not on file  Tobacco Use   Smoking status: Former Smoker    Last attempt to quit: 04/13/1977    Years since quitting: 41.2   Smokeless tobacco: Never Used  Substance and Sexual Activity   Alcohol use: No   Drug use: No   Sexual activity: Not on file  Lifestyle   Physical activity:    Days per week: Not on file    Minutes per session: Not on file   Stress: Not on file  Relationships   Social connections:    Talks on phone: Not on file    Gets together: Not on file    Attends religious service: Not on file    Active member of club or organization: Not on file    Attends meetings of clubs or organizations: Not on file    Relationship status: Not on file  Other Topics Concern   Not on file  Social History Narrative   Not on file        Objective:  Physical Exam: BP 120/68 (BP Location: Left Arm, Patient Position: Sitting, Cuff Size: Normal)    Pulse 85    Temp 98.7 F (37.1 C) (Oral)    Wt 154 lb 6.4 oz (70 kg)    SpO2 97%    BMI 26.09 kg/m   Gen: NAD, resting comfortably CV: Regular rate and rhythm with no murmurs appreciated Pulm: Normal work of breathing, clear to auscultation bilaterally with no crackles, wheezes, or rhonchi GI: Normal bowel sounds present. Soft, Nontender, Nondistended. MSK: No edema, cyanosis, or clubbing noted Skin: Warm, dry Neuro: Grossly normal, moves all extremities Psych: Normal affect and thought content  Results for orders placed or performed in visit on 07/12/18 (from the past 24 hour(s))  POCT glycosylated hemoglobin (Hb A1C)     Status: Abnormal   Collection Time: 07/12/18  1:52 PM  Result Value Ref Range   Hemoglobin A1C 7.6 (A) 4.0 - 5.6 %        Joycelynn Fritsche M. Jerline Pain, MD 07/12/2018 3:25 PM

## 2018-07-12 NOTE — Assessment & Plan Note (Signed)
Stable.  Continue gabapentin 600 mg nightly.

## 2018-07-12 NOTE — Assessment & Plan Note (Signed)
Stable.  No red flags.  Continue Fioricet and naratriptan as needed.

## 2018-07-12 NOTE — Patient Instructions (Signed)
It was very nice to see you today!  Please start metformin.  No other changes today.  Please let me know if you have any side effects or if your blood sugars are too low or too high.  We will check blood work today.  Come back to me in 3 months, or sooner as needed.  Take care, Dr Jerline Pain

## 2018-07-12 NOTE — Assessment & Plan Note (Signed)
Stable.  Continue Cymbalta 60 mg daily and Wellbutrin 150 mg daily.

## 2018-07-12 NOTE — Assessment & Plan Note (Signed)
Continue Valtrex 500 mg twice daily.

## 2018-07-12 NOTE — Assessment & Plan Note (Signed)
Stable Continue Cymbalta 60mg daily

## 2018-07-12 NOTE — Assessment & Plan Note (Signed)
A1c 7.6.  Her reported blood sugars are very labile with symptomatic lows into the 40s as well as highs into the 400s.  It is not clear why she would have such labile blood pressures despite only being on 12 units of Lantus and being a type II diabetic.  Will check C-peptide to see if she has any endogenous insulin production.  Told patient that we would aim for reasonable control without having dangerous lows.  We will add on metformin 500 mg daily.  Check C-peptide today as well.  Follow-up with me in 3 months.

## 2018-07-12 NOTE — Assessment & Plan Note (Signed)
Stable.  Continue Voltaren as needed.

## 2018-07-13 LAB — C-PEPTIDE: C-Peptide: 1.32 ng/mL (ref 0.80–3.85)

## 2018-07-14 NOTE — Progress Notes (Signed)
Please inform patient of the following:  B12 is normal - would like for her to continue her current dose Her C-peptide level is normal - looks like her body is still making its own insulin.  Do not need to make any changes to her treatment plan at this time. Recommend follow up in 3 months.  Algis Greenhouse. Jerline Pain, MD 07/14/2018 2:10 PM

## 2018-08-11 DIAGNOSIS — L409 Psoriasis, unspecified: Secondary | ICD-10-CM | POA: Diagnosis not present

## 2018-08-11 DIAGNOSIS — Z79899 Other long term (current) drug therapy: Secondary | ICD-10-CM | POA: Diagnosis not present

## 2018-08-11 DIAGNOSIS — L400 Psoriasis vulgaris: Secondary | ICD-10-CM | POA: Diagnosis not present

## 2018-09-23 DIAGNOSIS — E119 Type 2 diabetes mellitus without complications: Secondary | ICD-10-CM | POA: Diagnosis not present

## 2018-10-20 ENCOUNTER — Ambulatory Visit (INDEPENDENT_AMBULATORY_CARE_PROVIDER_SITE_OTHER): Payer: Medicare Other | Admitting: Family Medicine

## 2018-10-20 ENCOUNTER — Other Ambulatory Visit: Payer: Self-pay

## 2018-10-20 ENCOUNTER — Encounter: Payer: Self-pay | Admitting: Family Medicine

## 2018-10-20 VITALS — BP 124/84 | HR 74 | Temp 98.4°F | Ht 64.5 in | Wt 156.8 lb

## 2018-10-20 DIAGNOSIS — E119 Type 2 diabetes mellitus without complications: Secondary | ICD-10-CM

## 2018-10-20 MED ORDER — HYDROXYZINE HCL 50 MG PO TABS: 50.0000 mg | ORAL_TABLET | Freq: Three times a day (TID) | ORAL | 0 refills | Status: DC | PRN

## 2018-10-20 MED ORDER — METFORMIN HCL ER 500 MG PO TB24: 1000.0000 mg | ORAL_TABLET | Freq: Every day | ORAL | 3 refills | Status: DC

## 2018-10-20 MED ORDER — PREDNISONE 20 MG PO TABS: 20.0000 mg | ORAL_TABLET | Freq: Every day | ORAL | 0 refills | Status: AC

## 2018-10-20 MED ORDER — TRIAMCINOLONE 0.1 % CREAM:EUCERIN CREAM 1:1: 1.0000 "application " | TOPICAL_CREAM | Freq: Two times a day (BID) | CUTANEOUS | 0 refills | Status: AC

## 2018-10-20 NOTE — Patient Instructions (Signed)
It was very nice to see you today!  I think you have a dry, irritated skin.  Please take the prednisone once daily for the next 5 days.  Please take the hydroxyzine at night.  Please use the compounded lotion twice daily for the next week or so.  Let me know if your symptoms do not improve the next 5 to 7 days.  We will increase your metformin.  Please come back to see me the next month or 2 to recheck your A1c.  Take care, Dr Jerline Pain

## 2018-10-20 NOTE — Progress Notes (Signed)
   Chief Complaint:  Rhonda Farley is a 68 y.o. female who presents for same day appointment with a chief complaint of rash.   Assessment/Plan:  Rash / Xerosis Cutis Rash secondary to dry skin/xerosis cutis.  No signs of scabies-reassured patient.  She also has a few signs of eczema on bilateral palms.  Will start low-dose prednisone for the next 5 days.  Also start topical compound of Eucerin-triamcinolone twice daily for the next couple of days as well.  Will send in hydroxyzine as needed for itching.  Discussed reasons to return to care.  Follow-up as needed.  Type 2 diabetes mellitus without complication ([name]) Increase metformin 2000 mg daily.  Continue current dose of Lantus 12 units daily.  Follow-up in the next month or so to recheck A1c      Subjective:  HPI:  Rash, acute problem Started a few weeks ago.  Initially located on hands and feet however is progressed to involve trunk.  Area is very pruritic.  Improves with cold water.  Worse with hot showers.  No home treatments tried.  No new obvious soaps, detergents, or other exposures.  No fevers or chills.  No recent illnesses.  No other treatments tried.  No other obvious alleviating or aggravating factors.  She is worried about potential scabies however has no known contacts.  She has been isolated at home due to COVID-19 pandemic.  T2DM Blood sugars have been running a bit high. She would like to increase her metformin to 1000 mg daily.  She is being compliant with her current regimen of 500 mg daily and lantus 12U daily.    ROS: Per HPI  PMH: She reports that she quit smoking about 41 years ago. She has never used smokeless tobacco. She reports that she does not drink alcohol or use drugs.      Objective:  Physical Exam: BP 124/84 (BP Location: Left Arm, Patient Position: Sitting, Cuff Size: Normal)   Pulse 74   Temp 98.4 F (36.9 C) (Oral)   Ht 5' 4.5" (1.638 m)   Wt 156 lb 12.8 oz (71.1 kg)   SpO2 97%   BMI 26.50  kg/m   Gen: NAD, resting comfortably Skin: Diffuse, rough, papular, faintly erythematous rash involving torso, and bilateral extremities.  No linear streaks.  No crusting.     Algis Greenhouse. Jerline Pain, MD 10/20/2018 9:15 AM

## 2018-10-20 NOTE — Assessment & Plan Note (Signed)
Increase metformin 2000 mg daily.  Continue current dose of Lantus 12 units daily.  Follow-up in the next month or so to recheck A1c

## 2018-11-13 ENCOUNTER — Other Ambulatory Visit: Payer: Self-pay | Admitting: Family Medicine

## 2018-12-09 DIAGNOSIS — L718 Other rosacea: Secondary | ICD-10-CM | POA: Diagnosis not present

## 2018-12-09 DIAGNOSIS — Z79899 Other long term (current) drug therapy: Secondary | ICD-10-CM | POA: Diagnosis not present

## 2018-12-09 DIAGNOSIS — L400 Psoriasis vulgaris: Secondary | ICD-10-CM | POA: Diagnosis not present

## 2019-01-06 DIAGNOSIS — D1801 Hemangioma of skin and subcutaneous tissue: Secondary | ICD-10-CM | POA: Diagnosis not present

## 2019-01-06 DIAGNOSIS — L400 Psoriasis vulgaris: Secondary | ICD-10-CM | POA: Diagnosis not present

## 2019-01-06 DIAGNOSIS — D2272 Melanocytic nevi of left lower limb, including hip: Secondary | ICD-10-CM | POA: Diagnosis not present

## 2019-01-06 DIAGNOSIS — D224 Melanocytic nevi of scalp and neck: Secondary | ICD-10-CM | POA: Diagnosis not present

## 2019-01-06 DIAGNOSIS — L814 Other melanin hyperpigmentation: Secondary | ICD-10-CM | POA: Diagnosis not present

## 2019-01-06 DIAGNOSIS — L821 Other seborrheic keratosis: Secondary | ICD-10-CM | POA: Diagnosis not present

## 2019-01-06 DIAGNOSIS — D225 Melanocytic nevi of trunk: Secondary | ICD-10-CM | POA: Diagnosis not present

## 2019-01-06 DIAGNOSIS — D2261 Melanocytic nevi of right upper limb, including shoulder: Secondary | ICD-10-CM | POA: Diagnosis not present

## 2019-01-06 DIAGNOSIS — D2271 Melanocytic nevi of right lower limb, including hip: Secondary | ICD-10-CM | POA: Diagnosis not present

## 2019-01-06 DIAGNOSIS — Z79899 Other long term (current) drug therapy: Secondary | ICD-10-CM | POA: Diagnosis not present

## 2019-01-30 DIAGNOSIS — Z79899 Other long term (current) drug therapy: Secondary | ICD-10-CM | POA: Diagnosis not present

## 2019-01-30 DIAGNOSIS — L400 Psoriasis vulgaris: Secondary | ICD-10-CM | POA: Diagnosis not present

## 2019-03-15 DIAGNOSIS — Z79899 Other long term (current) drug therapy: Secondary | ICD-10-CM | POA: Diagnosis not present

## 2019-03-15 DIAGNOSIS — L400 Psoriasis vulgaris: Secondary | ICD-10-CM | POA: Diagnosis not present

## 2019-06-08 DIAGNOSIS — L400 Psoriasis vulgaris: Secondary | ICD-10-CM | POA: Diagnosis not present

## 2019-06-08 DIAGNOSIS — Z79899 Other long term (current) drug therapy: Secondary | ICD-10-CM | POA: Diagnosis not present

## 2019-06-11 ENCOUNTER — Ambulatory Visit: Payer: Medicare Other | Attending: Internal Medicine

## 2019-06-11 DIAGNOSIS — Z23 Encounter for immunization: Secondary | ICD-10-CM

## 2019-06-11 NOTE — Progress Notes (Signed)
   Covid-19 Vaccination Clinic  Name:  RODA TRENTMAN    MRN: [id number] DOB: 1950-12-25  06/11/2019  Ms. Pilch was observed post Covid-19 immunization for 15 minutes without incidence. She was provided with Vaccine Information Sheet and instruction to access the V-Safe system.   Ms. Alterman was instructed to call 911 with any severe reactions post vaccine: Marland Kitchen Difficulty breathing  . Swelling of your face and throat  . A fast heartbeat  . A bad rash all over your body  . Dizziness and weakness    Immunizations Administered    Name Date Dose VIS Date Route   Pfizer COVID-19 Vaccine 06/11/2019 12:53 PM 0.3 mL 03/24/2019 Intramuscular   Manufacturer: [place]   Lot: [id number]   [place]: [id number]

## 2019-06-28 DIAGNOSIS — L400 Psoriasis vulgaris: Secondary | ICD-10-CM | POA: Diagnosis not present

## 2019-07-11 ENCOUNTER — Ambulatory Visit: Payer: Medicare Other | Attending: Internal Medicine

## 2019-07-11 DIAGNOSIS — Z23 Encounter for immunization: Secondary | ICD-10-CM

## 2019-07-11 NOTE — Progress Notes (Signed)
   Covid-19 Vaccination Clinic  Name:  Rhonda Farley    MRN: [id number] DOB: 1950-10-28  07/11/2019  [name] was observed post Covid-19 immunization for 15 minutes without incident. She was provided with Vaccine Information Sheet and instruction to access the V-Safe system.   [name] was instructed to call 911 with any severe reactions post vaccine: Marland Kitchen Difficulty breathing  . Swelling of face and throat  . A fast heartbeat  . A bad rash all over body  . Dizziness and weakness   Immunizations Administered    Name Date Dose VIS Date Route   Pfizer COVID-19 Vaccine 07/11/2019 12:17 PM 0.3 mL 03/24/2019 Intramuscular   Manufacturer: [place]   Lot: [id number]   [place]: [id number]

## 2019-08-28 DIAGNOSIS — Z79899 Other long term (current) drug therapy: Secondary | ICD-10-CM | POA: Diagnosis not present

## 2019-09-14 ENCOUNTER — Telehealth (INDEPENDENT_AMBULATORY_CARE_PROVIDER_SITE_OTHER): Payer: BC Managed Care – PPO | Admitting: Family Medicine

## 2019-09-14 ENCOUNTER — Encounter: Payer: Self-pay | Admitting: Family Medicine

## 2019-09-14 VITALS — Temp 99.0°F | Wt 140.0 lb

## 2019-09-14 DIAGNOSIS — R059 Cough, unspecified: Secondary | ICD-10-CM

## 2019-09-14 DIAGNOSIS — R509 Fever, unspecified: Secondary | ICD-10-CM | POA: Diagnosis not present

## 2019-09-14 DIAGNOSIS — R05 Cough: Secondary | ICD-10-CM | POA: Diagnosis not present

## 2019-09-14 DIAGNOSIS — J4520 Mild intermittent asthma, uncomplicated: Secondary | ICD-10-CM | POA: Diagnosis not present

## 2019-09-14 MED ORDER — DOXYCYCLINE HYCLATE 100 MG PO TABS: 100.0000 mg | ORAL_TABLET | Freq: Two times a day (BID) | ORAL | 0 refills | Status: DC

## 2019-09-14 MED ORDER — BENZONATATE 100 MG PO CAPS: 100.0000 mg | ORAL_CAPSULE | Freq: Three times a day (TID) | ORAL | 0 refills | Status: DC | PRN

## 2019-09-14 NOTE — Progress Notes (Signed)
Virtual Visit via Video Note  I connected with Stanton Kidney  on 09/14/19 at  4:00 PM EDT by a video enabled telemedicine application and verified that I am speaking with the correct person using two identifiers.  Location patient: home, [place] Location provider:work or home office Persons participating in the virtual visit: patient, provider  I discussed the limitations of evaluation and management by telemedicine and the availability of in person appointments. The patient expressed understanding and agreed to proceed.   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  Past Medical History:  Diagnosis Date  . Allergy   . Arthritis   . Asthma   . Cancer ([name])    sebaceous cancer dx. 2008  . Cancer of sebaceous glands   . Chronic headaches   . Depression   . Diabetes mellitus   . Fibromyalgia   . GERD (gastroesophageal reflux disease)   . Hyperlipidemia   . Hypertension    no per patient  . Migraines   . Obesity   . Pneumonia   . Shingles   . Tachycardia   . Thyroid disease     Past Surgical History:  Procedure Laterality Date  . ABDOMINAL EXPLORATION SURGERY    . BUNIONECTOMY    . CERVICAL LAMINECTOMY  2000  . ROTATOR CUFF REPAIR  01/2010   right  . ROUX-EN-Y PROCEDURE  2009  . TONSILLECTOMY      Family History  Problem Relation Age of Onset  . Arthritis Mother   . Cancer Mother        colon/skin  . Colon  cancer Mother   . Cancer Father        lung/prostate  . Diabetes Paternal Uncle   . Colon cancer Paternal Uncle   . Arthritis Paternal Grandmother   . Heart disease Paternal Grandmother   . Arthritis Paternal Grandfather   . Cancer Paternal Grandfather        prostate  . Esophageal cancer Maternal Aunt     SOCIAL HX: see hpi   Current Outpatient Medications:  .  B Complex-C (SUPER B COMPLEX PO), Take by mouth daily., Disp: , Rfl:  .  budesonide-formoterol (SYMBICORT) 80-4.5 MCG/ACT inhaler, Inhale 2 puffs into the lungs as needed., Disp: 1 Inhaler, Rfl: 3 .  buPROPion (WELLBUTRIN XL) 150 MG 24 hr tablet, Take 1 tablet (150 mg total) by mouth every morning., Disp: 90 tablet, Rfl: 3 .  butalbital-acetaminophen-caffeine (FIORICET, ESGIC) 50-325-40 MG per tablet, Take 2 tablets by mouth as needed., Disp: 90 tablet, Rfl: 3 .  cetirizine (ZYRTEC) 10 MG tablet, Take 10 mg by mouth daily.  , Disp: , Rfl:  .  Coenzyme Q10 (CO Q 10 PO), Take by mouth daily., Disp: , Rfl:  .  cyanocobalamin (COBAL-1000) 1000 MCG/ML injection, Inject 1 mL (1,000 mcg total) into the muscle every 30 (thirty) days. PT may self adminster, Disp: 10 mL, Rfl: 3 .  diclofenac sodium (VOLTAREN) 1 % GEL, Apply 2 g topically 4 (four) times daily., Disp: 2 Tube, Rfl: 2 .  DULoxetine (CYMBALTA) 60 MG capsule, Take 1 capsule (60 mg total) by mouth daily., Disp: 90 capsule, Rfl: 3 .  fish oil-omega-3 fatty acids 1000 MG capsule, Take 1 g by mouth daily.  , Disp: , Rfl:  .  gabapentin (NEURONTIN) 300 MG capsule, Take 2 capsules (600 mg total) by mouth at bedtime. 2 tabs in AM, 1 afternoon, 2 at Bedtime, Disp: 450 capsule, Rfl: 1 .  hydrOXYzine (ATARAX/VISTARIL) 50 MG tablet, TAKE 1 TABLET (50 MG TOTAL) BY MOUTH 3 (THREE) TIMES DAILY AS NEEDED FOR ITCHING., Disp: 270 tablet, Rfl: 1 .  insulin glargine (LANTUS) 100 UNIT/ML injection, Inject into the skin at bedtime., Disp: , Rfl:  .  Insulin Pen Needle 32G X 6 MM MISC, 1 each by  Does not apply route daily., Disp: 100 each, Rfl: 2 .  metFORMIN (GLUCOPHAGE XR) 500 MG 24 hr tablet, Take 2 tablets (1,000 mg total) by mouth daily with breakfast., Disp: 90 tablet, Rfl: 3 .  Multiple Vitamin (MULTIVITAMIN) capsule, Take 1 capsule by mouth daily.  , Disp: , Rfl:  .  naratriptan (AMERGE) 2.5 MG tablet, Take 1 tablet (2.5 mg total) by mouth as needed. Take one (1) tablet at onset of headache; if returns or does not resolve, may repeat after 4 hours; do not exceed five (5) mg in 24 hours., Disp: 10 tablet, Rfl: 3 .  Olopatadine HCl (PATADAY) 0.2 % SOLN, Apply to eye as needed. , Disp: , Rfl:  .  ondansetron (ZOFRAN ODT) 4 MG disintegrating tablet, Take 1 tablet (4 mg total) by mouth every 8 (eight) hours as needed for nausea or vomiting., Disp: 20 tablet, Rfl: 0 .  simvastatin (ZOCOR) 40 MG tablet, Take 1 tablet (40 mg total) by mouth every other day., Disp: 90 tablet, Rfl: 1 .  traZODone (DESYREL) 50 MG tablet, TAKE 1 TABLET EVERY NIGHT AT BEDTIME  - GENERIC FOR  DESYREL, Disp: 90 tablet, Rfl: 0 .  Triamcinolone Acetonide (TRIAMCINOLONE 0.1 % CREAM : EUCERIN) CREA, Apply 1 application topically 2 (two) times daily., Disp: 1 each, Rfl: 0 .  valACYclovir (VALTREX) 500 MG tablet, Take 1 tablet (500 mg total) by mouth 2 (two) times daily., Disp: 180 tablet, Rfl: 3 .  benzonatate (TESSALON PERLES) 100 MG capsule, Take 1 capsule (100 mg total) by mouth 3 (three) times daily as needed., Disp: 20 capsule, Rfl: 0 .  doxycycline (VIBRA-TABS) 100 MG tablet, Take 1 tablet (100 mg total) by mouth 2 (two) times daily., Disp: 14 tablet, Rfl: 0  EXAM:  VITALS per patient if applicable:  GENERAL: alert, oriented, appears well and in no acute distress  HEENT: atraumatic, conjunttiva clear, no obvious abnormalities on inspection of external nose and ears  NECK: normal movements of the head and neck  LUNGS: on inspection no signs of respiratory distress, breathing rate appears normal, no obvious  gross SOB, gasping or wheezing, coughing intermittently during visit  CV: no obvious cyanosis  MS: moves all visible extremities without noticeable abnormality  PSYCH/NEURO: pleasant and cooperative, no obvious depression or anxiety, speech and thought processing grossly intact  ASSESSMENT AND PLAN:  Discussed the following assessment and plan:  Cough  Fever, unspecified fever cause  Mild intermittent asthma, unspecified whether complicated  -we discussed possible serious and likely etiologies, options for evaluation and workup, limitations of telemedicine visit vs in person visit, treatment, treatment risks  and precautions. Pt prefers to treat via telemedicine empirically rather then risking or undertaking an in person visit at this moment. Query influenza (though late in the season), Viral rep illness with possible developing sinusitis or LRI given persistence with fever and discolored sputum vs other. She opted for trial doxy 100mg  bid for possible 2ndary bacerial illness or CAP, prn alb, essalon for cough. Discussed COVID19 as well in light of there pandemic, much less likely given fully vaccinated, but she opted to test just in case given symptoms. Advised follow up with PCP office or with telemedicine visit next week and Patient agrees to seek prompt in person care if worsening, new symptoms arise, or if is not improving with treatment.   I discussed the assessment and treatment plan with the patient. The patient was provided an opportunity to ask questions and all were answered. The patient agreed with the plan and demonstrated an understanding of the instructions.   The patient was advised to call back or seek an in-person evaluation if the symptoms worsen or if the condition fails to improve as anticipated.   Lucretia Kern, DO

## 2019-09-14 NOTE — Patient Instructions (Signed)
-  I sent the medication(s) we discussed to your pharmacy: Meds ordered this encounter  Medications   doxycycline (VIBRA-TABS) 100 MG tablet    Sig: Take 1 tablet (100 mg total) by mouth 2 (two) times daily.    Dispense:  14 tablet    Refill:  0   benzonatate (TESSALON PERLES) 100 MG capsule    Sig: Take 1 capsule (100 mg total) by mouth 3 (three) times daily as needed.    Dispense:  20 capsule    Refill:  0    Please let us know if you have any questions or concerns regarding this prescription.  Follow up next week with your primary care office or with a telemedicine visit.  I hope you are feeling better soon! Seek care promptly if your symptoms worsen, new concerns arise or you are not improving with treatment over the next 24 hours.

## 2019-09-15 DIAGNOSIS — Z20822 Contact with and (suspected) exposure to covid-19: Secondary | ICD-10-CM | POA: Diagnosis not present

## 2019-11-22 DIAGNOSIS — Z79899 Other long term (current) drug therapy: Secondary | ICD-10-CM | POA: Diagnosis not present

## 2019-11-22 DIAGNOSIS — L400 Psoriasis vulgaris: Secondary | ICD-10-CM | POA: Diagnosis not present

## 2019-12-25 ENCOUNTER — Encounter: Payer: Self-pay | Admitting: Family Medicine

## 2019-12-25 ENCOUNTER — Telehealth (INDEPENDENT_AMBULATORY_CARE_PROVIDER_SITE_OTHER): Payer: Medicare Other | Admitting: Family Medicine

## 2019-12-25 VITALS — Temp 97.4°F | Ht 64.5 in | Wt 130.5 lb

## 2019-12-25 DIAGNOSIS — L409 Psoriasis, unspecified: Secondary | ICD-10-CM | POA: Diagnosis not present

## 2019-12-25 DIAGNOSIS — F419 Anxiety disorder, unspecified: Secondary | ICD-10-CM | POA: Diagnosis not present

## 2019-12-25 DIAGNOSIS — E538 Deficiency of other specified B group vitamins: Secondary | ICD-10-CM | POA: Diagnosis not present

## 2019-12-25 DIAGNOSIS — E1169 Type 2 diabetes mellitus with other specified complication: Secondary | ICD-10-CM | POA: Diagnosis not present

## 2019-12-25 DIAGNOSIS — F325 Major depressive disorder, single episode, in full remission: Secondary | ICD-10-CM | POA: Diagnosis not present

## 2019-12-25 DIAGNOSIS — E119 Type 2 diabetes mellitus without complications: Secondary | ICD-10-CM

## 2019-12-25 DIAGNOSIS — E785 Hyperlipidemia, unspecified: Secondary | ICD-10-CM

## 2019-12-25 MED ORDER — PANTOPRAZOLE SODIUM 40 MG PO TBEC: 40.0000 mg | DELAYED_RELEASE_TABLET | Freq: Every day | ORAL | 5 refills | Status: DC

## 2019-12-25 MED ORDER — BUTALBITAL-APAP-CAFFEINE 50-325-40 MG PO TABS: 2.0000 | ORAL_TABLET | ORAL | 3 refills | Status: DC | PRN

## 2019-12-25 MED ORDER — BUPROPION HCL ER (XL) 150 MG PO TB24: 150.0000 mg | ORAL_TABLET | ORAL | 3 refills | Status: DC

## 2019-12-25 MED ORDER — DULOXETINE HCL 60 MG PO CPEP: 60.0000 mg | ORAL_CAPSULE | Freq: Every day | ORAL | 3 refills | Status: DC

## 2019-12-25 MED ORDER — TOUJEO MAX SOLOSTAR 300 UNIT/ML ~~LOC~~ SOPN: 16.0000 [IU] | PEN_INJECTOR | Freq: Every day | SUBCUTANEOUS | 5 refills | Status: DC

## 2019-12-25 MED ORDER — SIMVASTATIN 40 MG PO TABS: 40.0000 mg | ORAL_TABLET | ORAL | 1 refills | Status: DC

## 2019-12-25 MED ORDER — BUDESONIDE-FORMOTEROL FUMARATE 80-4.5 MCG/ACT IN AERO: 2.0000 | INHALATION_SPRAY | RESPIRATORY_TRACT | 5 refills | Status: DC | PRN

## 2019-12-25 MED ORDER — NARATRIPTAN HCL 2.5 MG PO TABS: 2.5000 mg | ORAL_TABLET | ORAL | 3 refills | Status: DC | PRN

## 2019-12-25 MED ORDER — CYANOCOBALAMIN 1000 MCG/ML IJ SOLN: 1000.0000 ug | INTRAMUSCULAR | 3 refills | Status: DC

## 2019-12-25 NOTE — Assessment & Plan Note (Signed)
Stable.  Continue Cymbalta 60 mg daily.  We will send in refill today.

## 2019-12-25 NOTE — Assessment & Plan Note (Signed)
Refill simvastatin.  Need to check lipids with next blood draw.

## 2019-12-25 NOTE — Progress Notes (Signed)
   Rhonda Farley is a 69 y.o. female who presents today for a virtual office visit.  Assessment/Plan:  Chronic Problems Addressed Today: Anxiety Stable.  Continue Cymbalta 60 mg daily.  We will send in refill today.  Depression, major, single episode, complete remission ([name]) Stable.  Continue Cymbalta 60 mg daily.  We will send in refill today.  B12 deficiency s/p gastric bypass She is having some neuropathy.  Plan to recheck B12 soon.  She will continue monthly injection.  Type 2 diabetes mellitus without complication ([name]) Reported fasting sugars 90s to 160s.  Will increase glargine to 16 units daily.  Advised her to follow-up soon to recheck A1c.  Dyslipidemia associated with type 2 diabetes mellitus ([name]) Refill simvastatin.  Need to check lipids with next blood draw.  Psoriasis Continue methotrexate per dermatology.     Subjective:  HPI:  Patient did well.  Has not been seen for about a year.  She has been having worsening epigastric abdominal pain.  Some associated difficulty swallowing, decreased appetite, and early satiety.  She had 1 episode of reflux.  Her blood sugars have been typically in the 90s to 160s range.  She is currently taking Toujeo 15 units at bedtime.  No longer taking Metformin.  Her dermatology has her on methotrexate for psoriasis which seems to be working well.   She has had some worsening numbness and tingling in her feet.  She needs refill on all of her medications today.       Objective/Observations  Physical Exam: Gen: NAD, resting comfortably Pulm: Normal work of breathing Neuro: Grossly normal, moves all extremities Psych: Normal affect and thought content  Virtual Visit via Video   I connected with Rhonda Farley on 12/25/19 at  1:40 PM EDT by a video enabled telemedicine application and verified that I am speaking with the correct person using two identifiers. The limitations of evaluation and management by telemedicine and the  availability of in person appointments were discussed. The patient expressed understanding and agreed to proceed.   Patient location: Home Provider location: [place] participating in the virtual visit: Myself and Patient     Algis Greenhouse. Jerline Pain, MD 12/25/2019 2:23 PM

## 2019-12-25 NOTE — Assessment & Plan Note (Signed)
Continue methotrexate per dermatology.

## 2019-12-25 NOTE — Assessment & Plan Note (Signed)
Reported fasting sugars 90s to 160s.  Will increase glargine to 16 units daily.  Advised her to follow-up soon to recheck A1c.

## 2019-12-25 NOTE — Assessment & Plan Note (Signed)
She is having some neuropathy.  Plan to recheck B12 soon.  She will continue monthly injection.

## 2019-12-26 ENCOUNTER — Other Ambulatory Visit: Payer: Medicare Other

## 2019-12-26 ENCOUNTER — Other Ambulatory Visit: Payer: Self-pay

## 2019-12-26 DIAGNOSIS — Z20822 Contact with and (suspected) exposure to covid-19: Secondary | ICD-10-CM

## 2019-12-28 ENCOUNTER — Telehealth: Payer: Self-pay | Admitting: Family Medicine

## 2019-12-28 LAB — NOVEL CORONAVIRUS, NAA: SARS-CoV-2, NAA: NOT DETECTED

## 2019-12-28 LAB — SARS-COV-2, NAA 2 DAY TAT

## 2019-12-28 NOTE — Telephone Encounter (Signed)
Patient is calling in wanting to know what her test results were from the COVID test.

## 2019-12-28 NOTE — Telephone Encounter (Signed)
Results are not in yet.

## 2019-12-29 ENCOUNTER — Telehealth: Payer: Self-pay | Admitting: *Deleted

## 2019-12-29 NOTE — Telephone Encounter (Signed)
Pt called requesting Covi-19 test result None reactive results given. Pt stated members on her house hold tested positive. Stated will retest again.

## 2019-12-30 DIAGNOSIS — Z20828 Contact with and (suspected) exposure to other viral communicable diseases: Secondary | ICD-10-CM | POA: Diagnosis not present

## 2020-02-21 DIAGNOSIS — Z79899 Other long term (current) drug therapy: Secondary | ICD-10-CM | POA: Diagnosis not present

## 2020-02-21 DIAGNOSIS — L400 Psoriasis vulgaris: Secondary | ICD-10-CM | POA: Diagnosis not present

## 2020-04-16 DIAGNOSIS — Z23 Encounter for immunization: Secondary | ICD-10-CM | POA: Diagnosis not present

## 2020-05-08 DIAGNOSIS — M5432 Sciatica, left side: Secondary | ICD-10-CM | POA: Diagnosis not present

## 2020-05-08 DIAGNOSIS — M9903 Segmental and somatic dysfunction of lumbar region: Secondary | ICD-10-CM | POA: Diagnosis not present

## 2020-05-09 DIAGNOSIS — M9903 Segmental and somatic dysfunction of lumbar region: Secondary | ICD-10-CM | POA: Diagnosis not present

## 2020-05-09 DIAGNOSIS — M5432 Sciatica, left side: Secondary | ICD-10-CM | POA: Diagnosis not present

## 2020-05-15 DIAGNOSIS — M9903 Segmental and somatic dysfunction of lumbar region: Secondary | ICD-10-CM | POA: Diagnosis not present

## 2020-05-15 DIAGNOSIS — M5432 Sciatica, left side: Secondary | ICD-10-CM | POA: Diagnosis not present

## 2020-05-21 DIAGNOSIS — M9903 Segmental and somatic dysfunction of lumbar region: Secondary | ICD-10-CM | POA: Diagnosis not present

## 2020-05-21 DIAGNOSIS — M5432 Sciatica, left side: Secondary | ICD-10-CM | POA: Diagnosis not present

## 2020-05-29 DIAGNOSIS — L608 Other nail disorders: Secondary | ICD-10-CM | POA: Diagnosis not present

## 2020-05-29 DIAGNOSIS — Z79899 Other long term (current) drug therapy: Secondary | ICD-10-CM | POA: Diagnosis not present

## 2020-05-29 DIAGNOSIS — L400 Psoriasis vulgaris: Secondary | ICD-10-CM | POA: Diagnosis not present

## 2020-06-30 DIAGNOSIS — Z6823 Body mass index (BMI) 23.0-23.9, adult: Secondary | ICD-10-CM | POA: Diagnosis not present

## 2020-06-30 DIAGNOSIS — N390 Urinary tract infection, site not specified: Secondary | ICD-10-CM | POA: Diagnosis not present

## 2020-07-11 ENCOUNTER — Other Ambulatory Visit: Payer: Self-pay

## 2020-07-11 ENCOUNTER — Ambulatory Visit (INDEPENDENT_AMBULATORY_CARE_PROVIDER_SITE_OTHER): Payer: Medicare Other

## 2020-07-11 ENCOUNTER — Encounter: Payer: Self-pay | Admitting: Family Medicine

## 2020-07-11 ENCOUNTER — Ambulatory Visit (INDEPENDENT_AMBULATORY_CARE_PROVIDER_SITE_OTHER): Payer: Medicare Other | Admitting: Family Medicine

## 2020-07-11 VITALS — BP 109/69 | HR 90 | Ht 64.5 in

## 2020-07-11 DIAGNOSIS — E1169 Type 2 diabetes mellitus with other specified complication: Secondary | ICD-10-CM

## 2020-07-11 DIAGNOSIS — E1149 Type 2 diabetes mellitus with other diabetic neurological complication: Secondary | ICD-10-CM

## 2020-07-11 DIAGNOSIS — H9313 Tinnitus, bilateral: Secondary | ICD-10-CM | POA: Diagnosis not present

## 2020-07-11 DIAGNOSIS — F5101 Primary insomnia: Secondary | ICD-10-CM | POA: Diagnosis not present

## 2020-07-11 DIAGNOSIS — R350 Frequency of micturition: Secondary | ICD-10-CM | POA: Diagnosis not present

## 2020-07-11 DIAGNOSIS — M79672 Pain in left foot: Secondary | ICD-10-CM

## 2020-07-11 DIAGNOSIS — E1140 Type 2 diabetes mellitus with diabetic neuropathy, unspecified: Secondary | ICD-10-CM | POA: Insufficient documentation

## 2020-07-11 DIAGNOSIS — M19072 Primary osteoarthritis, left ankle and foot: Secondary | ICD-10-CM | POA: Diagnosis not present

## 2020-07-11 DIAGNOSIS — E785 Hyperlipidemia, unspecified: Secondary | ICD-10-CM

## 2020-07-11 DIAGNOSIS — F325 Major depressive disorder, single episode, in full remission: Secondary | ICD-10-CM

## 2020-07-11 DIAGNOSIS — E119 Type 2 diabetes mellitus without complications: Secondary | ICD-10-CM | POA: Diagnosis not present

## 2020-07-11 DIAGNOSIS — E538 Deficiency of other specified B group vitamins: Secondary | ICD-10-CM

## 2020-07-11 DIAGNOSIS — F419 Anxiety disorder, unspecified: Secondary | ICD-10-CM | POA: Diagnosis not present

## 2020-07-11 DIAGNOSIS — M549 Dorsalgia, unspecified: Secondary | ICD-10-CM

## 2020-07-11 DIAGNOSIS — M7732 Calcaneal spur, left foot: Secondary | ICD-10-CM | POA: Diagnosis not present

## 2020-07-11 LAB — POCT GLYCOSYLATED HEMOGLOBIN (HGB A1C): Hemoglobin A1C: 6.2 % — AB (ref 4.0–5.6)

## 2020-07-11 LAB — CBC
HCT: 37.6 % (ref 36.0–46.0)
Hemoglobin: 12.3 g/dL (ref 12.0–15.0)
MCHC: 32.7 g/dL (ref 30.0–36.0)
MCV: 91.8 fl (ref 78.0–100.0)
Platelets: 282 10*3/uL (ref 150.0–400.0)
RBC: 4.1 Mil/uL (ref 3.87–5.11)
RDW: 16.2 % — ABNORMAL HIGH (ref 11.5–15.5)
WBC: 5.6 10*3/uL (ref 4.0–10.5)

## 2020-07-11 LAB — COMPREHENSIVE METABOLIC PANEL
ALT: 11 U/L (ref 0–35)
AST: 16 U/L (ref 0–37)
Albumin: 4 g/dL (ref 3.5–5.2)
Alkaline Phosphatase: 82 U/L (ref 39–117)
BUN: 12 mg/dL (ref 6–23)
CO2: 33 mEq/L — ABNORMAL HIGH (ref 19–32)
Calcium: 9 mg/dL (ref 8.4–10.5)
Chloride: 100 mEq/L (ref 96–112)
Creatinine, Ser: 0.79 mg/dL (ref 0.40–1.20)
GFR: 77.28 mL/min (ref >60.00)
Glucose, Bld: 163 mg/dL — ABNORMAL HIGH (ref 70–99)
Potassium: 4.2 mEq/L (ref 3.5–5.1)
Sodium: 138 mEq/L (ref 135–145)
Total Bilirubin: 0.5 mg/dL (ref 0.2–1.2)
Total Protein: 6.9 g/dL (ref 6.0–8.3)

## 2020-07-11 LAB — LIPID PANEL
Cholesterol: 199 mg/dL (ref 0–200)
HDL: 76.3 mg/dL (ref >39.00)
LDL Cholesterol: 104 mg/dL — ABNORMAL HIGH (ref 0–99)
NonHDL: 122.8
Total CHOL/HDL Ratio: 3
Triglycerides: 96 mg/dL (ref 0.0–149.0)
VLDL: 19.2 mg/dL (ref 0.0–40.0)

## 2020-07-11 LAB — VITAMIN B12: Vitamin B-12: 258 pg/mL (ref 211–911)

## 2020-07-11 LAB — TSH: TSH: 2.92 u[IU]/mL (ref 0.35–4.50)

## 2020-07-11 MED ORDER — FREESTYLE LIBRE SENSOR SYSTEM MISC: 0 refills | Status: DC

## 2020-07-11 MED ORDER — FREESTYLE LIBRE READER DEVI: 0 refills | Status: DC

## 2020-07-11 MED ORDER — GABAPENTIN 100 MG PO CAPS: 100.0000 mg | ORAL_CAPSULE | Freq: Every day | ORAL | 3 refills | Status: DC

## 2020-07-11 MED ORDER — SIMVASTATIN 40 MG PO TABS: 40.0000 mg | ORAL_TABLET | ORAL | 1 refills | Status: DC

## 2020-07-11 MED ORDER — BUDESONIDE-FORMOTEROL FUMARATE 80-4.5 MCG/ACT IN AERO: 2.0000 | INHALATION_SPRAY | RESPIRATORY_TRACT | 5 refills | Status: AC | PRN

## 2020-07-11 MED ORDER — TOUJEO MAX SOLOSTAR 300 UNIT/ML ~~LOC~~ SOPN
14.0000 [IU] | PEN_INJECTOR | Freq: Every day | SUBCUTANEOUS | 5 refills | Status: DC
Start: 2020-07-11 — End: 2020-11-11

## 2020-07-11 NOTE — Assessment & Plan Note (Signed)
Stable on Cymbalta 60 mg daily. 

## 2020-07-11 NOTE — Assessment & Plan Note (Signed)
She is on Flexeril as needed.  Does well with this.

## 2020-07-11 NOTE — Assessment & Plan Note (Signed)
She no longer feels like trazodone is helping much.  We will start gabapentin 100 mg nightly.  Hopefully should help with peripheral neuropathy as well.  Will increase dose as tolerated.  She has been on gabapentin in the past and has done well with it.

## 2020-07-11 NOTE — Assessment & Plan Note (Signed)
Check B12 level. 

## 2020-07-11 NOTE — Patient Instructions (Addendum)
It was very nice to see you today!  Your A1c looks ok.  Please decrease your Toujeo to 14 units daily.  I will also send in a glucose monitor.  Please try the gabapentin 100 mg nightly.  Please check in with me in a few weeks let me know how this is working for you.  I hope this will help with the peripheral neuropathy as well as your sleep.  We will check a urine culture today.  Please continue the Macrobid as started by urgent care.  I will place a referral to ENT for your tinnitus and hearing loss.  We will check blood work today.   We will also check an x-ray of your foot to look for any signs of stress fracture.  Please avoid any activities that worsen the pain.  Take care, Dr Jerline Pain  PLEASE NOTE:  If you had any lab tests please let us know if you have not heard back within a few days. You may see your results on mychart before we have a chance to review them but we will give you a call once they are reviewed by [place]. If we ordered any referrals today, please let us know if you have not heard from their office within the next week.   Please try these tips to maintain a healthy lifestyle:   Eat at least 3 REAL meals and 1-2 snacks per day.  Aim for no more than 5 hours between eating.  If you eat breakfast, please do so within one hour of getting up.    Each meal should contain half fruits/vegetables, one quarter protein, and one quarter carbs (no bigger than a computer mouse)   Cut down on sweet beverages. This includes juice, soda, and sweet tea.     Drink at least 1 glass of water with each meal and aim for at least 8 glasses per day   Exercise at least 150 minutes every week.

## 2020-07-11 NOTE — Assessment & Plan Note (Signed)
Simvastatin refilled.  Check lipids today.

## 2020-07-11 NOTE — Assessment & Plan Note (Signed)
A1c well controlled at 6.2.  We will start gabapentin nightly.  We will also check B12.  She will check in with me in a few weeks let me know how the gabapentin is working.

## 2020-07-11 NOTE — Assessment & Plan Note (Signed)
A1c 6.2.  She has had some lows.  Will decrease Toujeo to 14 units daily.  Need to recheck A1c in 3 to 6 months.

## 2020-07-11 NOTE — Progress Notes (Signed)
   Rhonda Farley is a 70 y.o. female who presents today for an office visit.  Assessment/Plan:  New/Acute Problems: UTI We will check urine culture today.  She will continue Macrobid.  Foot Pain Possible stress reaction.  Will check x-ray to rule out stress fracture.  Advised her to avoid activities that worsen the pain.  Tinnitus/hearing loss May be sequela of COVID.  She failed her in office hearing screening today.  Will place referral to ENT.  Chronic Problems Addressed Today: Diabetic neuropathy ([name]) A1c well controlled at 6.2.  We will start gabapentin nightly.  We will also check B12.  She will check in with me in a few weeks let me know how the gabapentin is working.  Anxiety Stable on Cymbalta 60 mg daily.  Depression, major, single episode, complete remission ([name]) Stable on Cymbalta 60 mg daily.  B12 deficiency s/p gastric bypass Check B12 level.  Type 2 diabetes mellitus without complication ([name]) [name] 6.2.  She has had some lows.  Will decrease Toujeo to 14 units daily.  Need to recheck A1c in 3 to 6 months.  Dyslipidemia associated with type 2 diabetes mellitus ([name]) Simvastatin refilled.  Check lipids today.  Back pain She is on Flexeril as needed.  Does well with this.  Primary insomnia She no longer feels like trazodone is helping much.  We will start gabapentin 100 mg nightly.  Hopefully should help with peripheral neuropathy as well.  Will increase dose as tolerated.  She has been on gabapentin in the past and has done well with it.     Subjective:  HPI:  See A/P for status of chronic conditions.  She had COVID few months ago.  Recovered okay however since then has had intermittent severe headache that responds moderately to Fioricet.  Also has had more tinnitus and maybe some slight hearing loss.  She has also noticed left foot pain for the last few weeks.  She has been doing a lot of walking and walking about 10 miles per day.  She is worried  about possible stress fracture.  Pain is worse with palpation.  She is also diagnosed with UTI 5 days ago.  She was started on Septra and then switched to [place].  Still has urinary frequency.  No dysuria.  No reported fevers or chills.       Objective:  Physical Exam: BP 109/69   Pulse 90   Ht 5' 4.5" (1.638 m)   SpO2 97%   BMI 22.05 kg/m   Gen: No acute distress, resting comfortably HEENT: TMs with clear effusion bilaterally. CV: Regular rate and rhythm with no murmurs appreciated Pulm: Normal work of breathing, clear to auscultation bilaterally with no crackles, wheezes, or rhonchi MSK: Left foot with tenderness palpation along midfoot.  Neurovascular intact distally. Neuro: Cranial nerves II through XII intact.  Moves all extremities spontaneously. Psych: Normal affect and thought content  Time Spent: 45 minutes of total time was spent on the date of the encounter performing the following actions: chart review prior to seeing the patient, obtaining history, performing a medically necessary exam, counseling on the treatment plan including urine cutlure xray, and labs today, placing orders, and documenting in our EHR.        Algis Greenhouse. Jerline Pain, MD 07/11/2020 8:49 AM

## 2020-07-12 ENCOUNTER — Other Ambulatory Visit: Payer: Self-pay | Admitting: *Deleted

## 2020-07-12 DIAGNOSIS — M79672 Pain in left foot: Secondary | ICD-10-CM

## 2020-07-12 LAB — URINE CULTURE
MICRO NUMBER:: 11716067
Result:: NO GROWTH
SPECIMEN QUALITY:: ADEQUATE

## 2020-07-12 NOTE — Progress Notes (Signed)
Please inform patient of the following:  Her b12 is borderline low. This could explain some of her symptoms. Would like for her to start B12 protocol here. Her cholesterol is very mildly elevated. Everything else is stable. Do not need to make any changes to her treatment plan at this time. We can recheck in a year.  Rhonda Farley. Jerline Pain, MD 07/12/2020 8:45 AM

## 2020-07-12 NOTE — Progress Notes (Signed)
Please inform patient of the following:  Xray shows possible avulsion fracture in her foot. Recommend that we have her follow up with sports medicine or orthopedics. We can place referral if needed. Would like for her to limit weight bearing if possible.  Algis Greenhouse. Jerline Pain, MD 07/12/2020 8:43 AM

## 2020-07-15 ENCOUNTER — Encounter: Payer: Self-pay | Admitting: Family Medicine

## 2020-07-15 NOTE — Progress Notes (Signed)
Please inform patient of the following:  Urine culture is negative. Do not need to do any further testing at this time. Would like for her to let us know if her symptoms are still persistent.  Algis Greenhouse. Jerline Pain, MD 07/15/2020 8:23 AM

## 2020-07-17 ENCOUNTER — Telehealth: Payer: Self-pay | Admitting: *Deleted

## 2020-07-17 DIAGNOSIS — M79672 Pain in left foot: Secondary | ICD-10-CM | POA: Diagnosis not present

## 2020-07-17 DIAGNOSIS — M84375A Stress fracture, left foot, initial encounter for fracture: Secondary | ICD-10-CM | POA: Diagnosis not present

## 2020-07-17 NOTE — Telephone Encounter (Signed)
Approved CaseId:[id number];Status:Approved;Review Type:Prior Auth;Coverage Start Date:06/17/2020;Coverage End Date:07/17/2021; Pharmacy notified

## 2020-07-17 NOTE — Telephone Encounter (Signed)
[facility] Budesonide-Formoterol Fumarate 80-4.5MCG/ACT aerosol Engineer, production [place] Form [phone] NCPDP) Send today 07/17/2020 waiting for determination

## 2020-08-06 ENCOUNTER — Other Ambulatory Visit: Payer: Self-pay

## 2020-08-06 ENCOUNTER — Encounter (INDEPENDENT_AMBULATORY_CARE_PROVIDER_SITE_OTHER): Payer: Self-pay | Admitting: Otolaryngology

## 2020-08-06 ENCOUNTER — Ambulatory Visit (INDEPENDENT_AMBULATORY_CARE_PROVIDER_SITE_OTHER): Payer: Medicare Other | Admitting: Otolaryngology

## 2020-08-06 VITALS — Temp 97.3°F

## 2020-08-06 DIAGNOSIS — H9313 Tinnitus, bilateral: Secondary | ICD-10-CM

## 2020-08-06 DIAGNOSIS — H903 Sensorineural hearing loss, bilateral: Secondary | ICD-10-CM | POA: Diagnosis not present

## 2020-08-06 NOTE — Progress Notes (Signed)
HPI: Rhonda Farley is a 70 y.o. female who presents is referred by her PCP for evaluation of tinnitus ever since she got COVID in November of last year.  She lost her sense of smell and taste and was sick while she had the COVID but stated home.  She gradually regained her smell and taste and February.  However she has had persistent ringing or tinnitus in her ears since then.  Past Medical History:  Diagnosis Date  . Allergy   . Arthritis   . Asthma   . Cancer ([name])    sebaceous cancer dx. 2008  . Cancer of sebaceous glands   . Chronic headaches   . Depression   . Diabetes mellitus   . Fibromyalgia   . GERD (gastroesophageal reflux disease)   . Hyperlipidemia   . Hypertension    no per patient  . Migraines   . Obesity   . Pneumonia   . Shingles   . Tachycardia   . Thyroid disease    Past Surgical History:  Procedure Laterality Date  . ABDOMINAL EXPLORATION SURGERY    . BUNIONECTOMY    . CERVICAL LAMINECTOMY  2000  . ROTATOR CUFF REPAIR  01/2010   right  . ROUX-EN-Y PROCEDURE  2009  . TONSILLECTOMY     Social History   Socioeconomic History  . Marital status: Married    Spouse name: Not on file  . Number of children: Not on file  . Years of education: Not on file  . Highest education level: Not on file  Occupational History  . Not on file  Tobacco Use  . Smoking status: Former Smoker    Quit date: 04/13/1977    Years since quitting: 43.3  . Smokeless tobacco: Never Used  Substance and Sexual Activity  . Alcohol use: No  . Drug use: No  . Sexual activity: Not on file  Other Topics Concern  . Not on file  Social History Narrative  . Not on file   Social Determinants of Health   Financial Resource Strain: Not on file  Food Insecurity: Not on file  Transportation Needs: Not on file  Physical Activity: Not on file  Stress: Not on file  Social Connections: Not on file   Family History  Problem Relation Age of Onset  . Arthritis Mother   . Cancer  Mother        colon/skin  . Colon cancer Mother   . Cancer Father        lung/prostate  . Diabetes Paternal Uncle   . Colon cancer Paternal Uncle   . Arthritis Paternal Grandmother   . Heart disease Paternal Grandmother   . Arthritis Paternal Grandfather   . Cancer Paternal Grandfather        prostate  . Esophageal cancer Maternal Aunt    Allergies  Allergen Reactions  . Citrullus Vulgaris Anaphylaxis    Watermelon   Prior to Admission medications   Medication Sig Start Date End Date Taking? Authorizing Provider  B Complex-C (SUPER B COMPLEX PO) Take by mouth daily.    [provider]  budesonide-formoterol (SYMBICORT) 80-4.5 MCG/ACT inhaler Inhale 2 puffs into the lungs as needed. 07/11/20   Vivi Barrack, MD  buPROPion (WELLBUTRIN XL) 150 MG 24 hr tablet Take 1 tablet (150 mg total) by mouth every morning. 12/25/19   Vivi Barrack, MD  butalbital-acetaminophen-caffeine (FIORICET) [phone] MG tablet Take 2 tablets by mouth as needed. 12/25/19   Vivi Barrack, MD  cetirizine (ZYRTEC) 10 MG tablet Take 10 mg by mouth daily.    [provider]  Continuous Blood Gluc Receiver (FREESTYLE LIBRE READER) DEVI Use up to 4 times daily to check blood sugar. 07/11/20   Vivi Barrack, MD  Continuous Blood Gluc Sensor (FREESTYLE LIBRE SENSOR SYSTEM) MISC USe up to 4 times daily to check blood sugar. 07/11/20   Vivi Barrack, MD  cyanocobalamin (COBAL-1000) 1000 MCG/ML injection Inject 1 mL (1,000 mcg total) into the muscle every 30 (thirty) days. PT may self adminster 12/25/19   Vivi Barrack, MD  cyclobenzaprine (FLEXERIL) 10 MG tablet Take by mouth. 02/25/15   [provider]  diclofenac sodium (VOLTAREN) 1 % GEL Apply 2 g topically 4 (four) times daily. 03/22/12   Prueter, Santiago Glad, PA-C  DULoxetine (CYMBALTA) 60 MG capsule Take 1 capsule (60 mg total) by mouth daily. 12/25/19   Vivi Barrack, MD  fish oil-omega-3 fatty acids 1000 MG capsule Take 1 g by mouth  daily.    [provider]  gabapentin (NEURONTIN) 100 MG capsule Take 1 capsule (100 mg total) by mouth at bedtime. 07/11/20   Vivi Barrack, MD  insulin glargine, 2 Unit Dial, (TOUJEO MAX SOLOSTAR) 300 UNIT/ML Solostar Pen Inject 14 Units into the skin daily. 07/11/20   Vivi Barrack, MD  Insulin Pen Needle 32G X 6 MM MISC 1 each by Does not apply route daily. 09/29/12   Marletta Lor, MD  methotrexate 2.5 MG tablet  11/24/18   [provider]  Multiple Vitamin (MULTIVITAMIN) capsule Take 1 capsule by mouth daily.    [provider]  Olopatadine HCl 0.2 % SOLN Apply to eye as needed.    [provider]  ondansetron (ZOFRAN ODT) 4 MG disintegrating tablet Take 1 tablet (4 mg total) by mouth every 8 (eight) hours as needed for nausea or vomiting. 07/12/18   Vivi Barrack, MD  simvastatin (ZOCOR) 40 MG tablet Take 1 tablet (40 mg total) by mouth every other day. 07/11/20   Vivi Barrack, MD  traZODone (DESYREL) 50 MG tablet TAKE 1 TABLET EVERY NIGHT AT BEDTIME  - GENERIC FOR  DESYREL 10/19/16   Bo Merino, MD  Triamcinolone Acetonide (TRIAMCINOLONE 0.1 % CREAM : EUCERIN) CREA Apply 1 application topically 2 (two) times daily. 10/20/18   Vivi Barrack, MD  valACYclovir (VALTREX) 500 MG tablet Take 1 tablet (500 mg total) by mouth 2 (two) times daily. 03/07/13   Marletta Lor, MD     Positive ROS: Otherwise negative  All other systems have been reviewed and were otherwise negative with the exception of those mentioned in the HPI and as above.  Physical Exam: Constitutional: Alert, well-appearing, no acute distress Ears: External ears without lesions or tenderness. Ear canals are clear bilaterally with intact, clear TMs bilaterally. Nasal: External nose without lesions. Septum with minimal deformity. Clear nasal passages bilaterally. Oral: Lips and gums without lesions. Tongue and palate mucosa without lesions. Posterior oropharynx  clear. Neck: No palpable adenopathy or masses Respiratory: Breathing comfortably  Skin: No facial/neck lesions or rash noted.  Audiologic testing demonstrated normal hearing in the lower frequencies up to 4000 frequency.  She has a downsloping sensorineural hearing loss above 4000 frequency in both ears which was symmetric.  She had type A tympanograms bilaterally.  SRT's were 20 dB bilaterally.  Procedures  Assessment: Reviewed with her that the tinnitus is secondary to the high-frequency sensorineural hearing loss.  Plan: Reviewed with  her concerning limited treatment options for tinnitus.  Discussed with her concerning using masking noise when the tinnitus is bothersome in a quiet environment. Also cautioned her about using ear protection when around loud noise. I gave her some samples of Lipo flavonoid to try as this is beneficial in reducing tinnitus in some people. She will follow-up as needed.   Radene Journey, MD   CC:

## 2020-08-12 DIAGNOSIS — L400 Psoriasis vulgaris: Secondary | ICD-10-CM | POA: Diagnosis not present

## 2020-08-12 DIAGNOSIS — Z79899 Other long term (current) drug therapy: Secondary | ICD-10-CM | POA: Diagnosis not present

## 2020-08-13 ENCOUNTER — Encounter (INDEPENDENT_AMBULATORY_CARE_PROVIDER_SITE_OTHER): Payer: Self-pay

## 2020-10-23 ENCOUNTER — Telehealth (INDEPENDENT_AMBULATORY_CARE_PROVIDER_SITE_OTHER): Payer: Medicare Other | Admitting: Family Medicine

## 2020-10-23 ENCOUNTER — Encounter: Payer: Self-pay | Admitting: Family Medicine

## 2020-10-23 ENCOUNTER — Other Ambulatory Visit: Payer: Self-pay

## 2020-10-23 DIAGNOSIS — R399 Unspecified symptoms and signs involving the genitourinary system: Secondary | ICD-10-CM | POA: Diagnosis not present

## 2020-10-23 DIAGNOSIS — R509 Fever, unspecified: Secondary | ICD-10-CM

## 2020-10-23 NOTE — Progress Notes (Signed)
Virtual Visit via Video Note  Subjective  CC:  Chief Complaint  Patient presents with   Fever    Mostly 100-100.4 Started Saturday- SOB, Fever, headache, lower abdominal pain, urinary frequency, fatigue  Two negative COVID test      I connected with [name] on 10/23/20 at  4:30 PM EDT by a video enabled telemedicine application and verified that I am speaking with the correct person using two identifiers. Location patient: Home Location provider: [facility] [place], Office Persons participating in the virtual visit: HANAE WAITERS, Leamon Arnt, MD Reymundo Poll CMA  Same day acute visit; PCP not available. New pt to me. Chart reviewed.   I discussed the limitations of evaluation and management by telemedicine and the availability of in person appointments. The patient expressed understanding and agreed to proceed. HPI: Rhonda Farley is a 70 y.o. female who was contacted today to address the problems listed above in the chief complaint. 70 year old female with history of diabetes presents with fevers, mild cough and shortness of breath, lower abdominal discomfort, urinary frequency.  Symptoms started 5 days ago.  T-max was 101.  Since she has been taking regular Tylenol and temperatures remained above normal, 100-100.4.  She has been vaccinated fully against COVID.  She did have a personal history of COVID a few months back.  She is taken to home COVID test and both have been negative.  She denies sore throat, chest pain, nausea or vomiting.  She also has a dull posterior headache.  No known exposures.  She does have history of urinary tract infections.  She thinks that could be a problem.  She denies flank pain.  She recently took a long bus ride for 8 hours but denies calf pain or swelling.  She has been eating and drinking normally.  No diarrhea. Diabetes has been well controlled.  She does have a history of migraines.   Assessment  1. Febrile  illness   2. Lower urinary tract symptoms (LUTS)      Plan  Febrile illness: Due to lower abdominal discomfort, urinary symptoms and complaints of shortness of breath, feel in person visit with examination is imperative.  Patient declines going to an urgent care at this time.  I will see her tomorrow morning at 8 AM.  We discussed emergent symptoms including worsening abdominal pain, high-grade fever, worsening shortness of breath: All would warrant emergency room evaluation.  Patient appears nontoxic on video visit today.  Continue Tylenol continue monitoring fevers.  Hydrate.  Further evaluation in the office in the morning. I discussed the assessment and treatment plan with the patient. The patient was provided an opportunity to ask questions and all were answered. The patient agreed with the plan and demonstrated an understanding of the instructions.   The patient was advised to call back or seek an in-person evaluation if the symptoms worsen or if the condition fails to improve as anticipated. Follow up: Tomorrow at 8 AM in the office Visit date not found  No orders of the defined types were placed in this encounter.     I reviewed the patients updated PMH, FH, and SocHx.    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   budesonide-formoterol (SYMBICORT) 80-4.5 MCG/ACT inhaler Inhale 2 puffs into the lungs as needed.   buPROPion (WELLBUTRIN XL) 150 MG 24 hr tablet Take 1 tablet (150 mg total) by mouth every morning.   butalbital-acetaminophen-caffeine (FIORICET) 50-325-40 MG tablet Take 2 tablets by mouth as needed.   cetirizine (ZYRTEC) 10 MG tablet Take 10 mg by mouth daily.   Continuous Blood Gluc Receiver (FREESTYLE LIBRE READER) DEVI Use up to 4 times daily to check blood sugar.   Continuous Blood Gluc Sensor (FREESTYLE LIBRE SENSOR SYSTEM) MISC USe up to 4 times daily to check blood sugar.   cyanocobalamin (COBAL-1000) 1000 MCG/ML injection Inject 1 mL (1,000 mcg total) into the muscle every 30 (thirty) days. PT may self adminster   cyclobenzaprine (FLEXERIL) 10 MG tablet Take by mouth.   diclofenac sodium (VOLTAREN) 1 % GEL Apply 2 g topically 4 (four) times daily.   DULoxetine (CYMBALTA) 60 MG capsule Take 1 capsule (60 mg total) by mouth daily.   fish oil-omega-3 fatty acids 1000 MG capsule Take 1 g by mouth daily.   gabapentin (NEURONTIN) 100 MG capsule Take 1 capsule (100 mg total) by mouth at bedtime.   insulin glargine, 2 Unit Dial, (TOUJEO MAX SOLOSTAR) 300 UNIT/ML Solostar Pen Inject 14 Units into the skin daily.   Insulin Pen Needle 32G X 6 MM MISC 1 each by Does not apply route daily.   methotrexate 2.5 MG tablet    Multiple Vitamin (MULTIVITAMIN) capsule Take 1 capsule by mouth daily.   Olopatadine HCl 0.2 % SOLN Apply to eye as needed.   ondansetron (ZOFRAN ODT) 4 MG disintegrating tablet Take 1 tablet (4 mg total) by mouth every 8 (eight) hours as needed for nausea or vomiting.   simvastatin (ZOCOR) 40 MG tablet Take 1 tablet (40 mg total) by mouth every other day.   traZODone (DESYREL) 50 MG tablet TAKE 1 TABLET EVERY NIGHT AT BEDTIME  - GENERIC FOR  DESYREL   Triamcinolone Acetonide (TRIAMCINOLONE 0.1 % CREAM : EUCERIN) CREA Apply 1 application topically 2 (two) times  daily.   valACYclovir (VALTREX) 500 MG tablet Take 1 tablet (500 mg total) by mouth 2 (two) times daily.    Allergies: Patient is allergic to citrullus vulgaris. Family History: Patient family history includes Arthritis in her mother, paternal grandfather, and paternal grandmother; Cancer in her father, mother, and paternal grandfather; Colon cancer in her mother and paternal uncle; Diabetes in her paternal uncle; Esophageal cancer in her maternal aunt; Heart disease in her paternal grandmother. Social History:  Patient  reports that she quit smoking about 43 years ago. She has never used smokeless tobacco. She reports that she does not drink alcohol and does not use drugs.  Review of Systems: Constitutional: positive for fever malaise or anorexia Cardiovascular: negative for chest pain Respiratory: Positive for SOB or persistent cough Gastrointestinal: Positive for abdominal pain GU: urinary frequency w/o dysuria or gross hematuria  OBJECTIVE Vitals: There were no vitals taken for this visit.  Reports 100.4 General: no acute distress , A&Ox3, no respiratory  distress, speaking full sentences.  Leamon Arnt, MD

## 2020-10-24 ENCOUNTER — Other Ambulatory Visit: Payer: Self-pay

## 2020-10-24 ENCOUNTER — Encounter: Payer: Self-pay | Admitting: Family Medicine

## 2020-10-24 ENCOUNTER — Ambulatory Visit (INDEPENDENT_AMBULATORY_CARE_PROVIDER_SITE_OTHER)
Admission: RE | Admit: 2020-10-24 | Discharge: 2020-10-24 | Disposition: A | Discharge status: Home / Self Care | Payer: Medicare Other | Source: Ambulatory Visit | Attending: Family Medicine | Admitting: Family Medicine

## 2020-10-24 ENCOUNTER — Ambulatory Visit (INDEPENDENT_AMBULATORY_CARE_PROVIDER_SITE_OTHER): Payer: Medicare Other | Admitting: Family Medicine

## 2020-10-24 VITALS — BP 132/88 | HR 75 | Temp 98.4°F | Wt 143.0 lb

## 2020-10-24 DIAGNOSIS — R059 Cough, unspecified: Secondary | ICD-10-CM

## 2020-10-24 DIAGNOSIS — R399 Unspecified symptoms and signs involving the genitourinary system: Secondary | ICD-10-CM

## 2020-10-24 DIAGNOSIS — R509 Fever, unspecified: Secondary | ICD-10-CM | POA: Diagnosis not present

## 2020-10-24 LAB — COMPREHENSIVE METABOLIC PANEL
ALT: 18 U/L (ref 0–35)
AST: 23 U/L (ref 0–37)
Albumin: 3.8 g/dL (ref 3.5–5.2)
Alkaline Phosphatase: 92 U/L (ref 39–117)
BUN: 12 mg/dL (ref 6–23)
CO2: 31 mEq/L (ref 19–32)
Calcium: 8.8 mg/dL (ref 8.4–10.5)
Chloride: 99 mEq/L (ref 96–112)
Creatinine, Ser: 0.76 mg/dL (ref 0.40–1.20)
GFR: 80.79 mL/min (ref >60.00)
Glucose, Bld: 154 mg/dL — ABNORMAL HIGH (ref 70–99)
Potassium: 4.1 mEq/L (ref 3.5–5.1)
Sodium: 136 mEq/L (ref 135–145)
Total Bilirubin: 0.4 mg/dL (ref 0.2–1.2)
Total Protein: 7.1 g/dL (ref 6.0–8.3)

## 2020-10-24 LAB — CBC WITH DIFFERENTIAL/PLATELET
Basophils Absolute: 0.1 10*3/uL (ref 0.0–0.1)
Basophils Relative: 0.8 % (ref 0.0–3.0)
Eosinophils Absolute: 0.6 10*3/uL (ref 0.0–0.7)
Eosinophils Relative: 7.9 % — ABNORMAL HIGH (ref 0.0–5.0)
HCT: 36.6 % (ref 36.0–46.0)
Hemoglobin: 12.5 g/dL (ref 12.0–15.0)
Lymphocytes Relative: 25.5 % (ref 12.0–46.0)
Lymphs Abs: 1.9 10*3/uL (ref 0.7–4.0)
MCHC: 34 g/dL (ref 30.0–36.0)
MCV: 90.9 fl (ref 78.0–100.0)
Monocytes Absolute: 0.8 10*3/uL (ref 0.1–1.0)
Monocytes Relative: 10.2 % (ref 3.0–12.0)
Neutro Abs: 4.2 10*3/uL (ref 1.4–7.7)
Neutrophils Relative %: 55.6 % (ref 43.0–77.0)
Platelets: 330 10*3/uL (ref 150.0–400.0)
RBC: 4.03 Mil/uL (ref 3.87–5.11)
RDW: 15.4 % (ref 11.5–15.5)
WBC: 7.5 10*3/uL (ref 4.0–10.5)

## 2020-10-24 LAB — POCT URINALYSIS DIPSTICK
Bilirubin, UA: NEGATIVE
Blood, UA: NEGATIVE
Glucose, UA: POSITIVE — AB
Ketones, UA: NEGATIVE
Leukocytes, UA: NEGATIVE
Nitrite, UA: NEGATIVE
Protein, UA: POSITIVE — AB
Spec Grav, UA: 1.02 (ref 1.010–1.025)
Urobilinogen, UA: 1 E.U./dL
pH, UA: 6 (ref 5.0–8.0)

## 2020-10-24 MED ORDER — BENZONATATE 100 MG PO CAPS: 100.0000 mg | ORAL_CAPSULE | Freq: Two times a day (BID) | ORAL | 0 refills | Status: DC | PRN

## 2020-10-24 MED ORDER — AZITHROMYCIN 250 MG PO TABS: ORAL_TABLET | ORAL | 0 refills | Status: DC

## 2020-10-24 MED ORDER — GUAIFENESIN-CODEINE 100-10 MG/5ML PO SOLN: 5.0000 mL | Freq: Four times a day (QID) | ORAL | 0 refills | Status: DC | PRN

## 2020-10-24 NOTE — Progress Notes (Signed)
Subjective  CC:  Chief Complaint  Patient presents with   Abdominal Pain    Follow up from virtual visit yesterday    HPI: Rhonda Farley is a 70 y.o. female who presents to the office today to address the problems listed above in the chief complaint. Please see video visit from yesterday afternoon.  This is in person follow-up.  Patient has had 6 days of a febrile illness.  T-max was 101.  Last night she had a temperature of 100.0.  This has been treated with Tylenol.  It is responsive to Tylenol.  She is afebrile now.  She continues to complain of spasm-like cough and difficulty taking deep breaths in.  She denies pleuritic chest pain or exertional symptoms.  She has a history of asthma and BOOP.  She says it feels the same as when she had that problem many years ago.  She has tried albuterol without any significant relief.  She denies wheezing.  Her cough is spasm-like and dry.  She denies other URI symptoms, specifically no sore throat, nasal congestion, ear pain.  She does have a right-sided headache.  To home COVID test have been negative.  She did have COVID within the last 2 months.  She is fully vaccinated. Of note, she had an 8 hour bus trip about a week and a half ago.  She denies lower extremity swelling, calf pain or palpitations.  She also complains of decreased appetite without nausea, vomiting, constipation or diarrhea.  She has lower abdominal tenderness associated with urinary frequency but no dysuria or gross hematuria.  No flank pain.  Assessment  1. Febrile illness   2. Cough   3. Lower urinary tract symptoms      Plan  Febrile illness with cough and lower urinary tract symptoms: Patient is clinically stable.  Further work-up initiated with blood work, chest x-ray and urine culture.  No clear signs of urinary tract infection at this time.  Abdomen is benign.  Lungs are clear.  Oxygenation is normal at 98% after recheck.  Heart rate is normal.  I suspect this is a viral  bronchitis but given fevers will cover with Z-Pak.  Treat with cough medicines, Tessalon Perles and Robitussin-AC.  Check chest x-ray.  She has a low likelihood of DVT/PE by Wells criteria.  She may use her albuterol if needed.  Recheck in 1 week if symptoms persist.  Follow up: 1 week with PCP. Visit date not found  Orders Placed This Encounter  Procedures   Urine Culture   DG Chest 2 View   CBC with Differential/Platelet   Comprehensive metabolic panel   POCT Urinalysis Dipstick   Meds ordered this encounter  Medications   azithromycin (ZITHROMAX) 250 MG tablet    Sig: Take 2 tabs today, then 1 tab daily for 4 days    Dispense:  1 each    Refill:  0   benzonatate (TESSALON) 100 MG capsule    Sig: Take 1 capsule (100 mg total) by mouth 2 (two) times daily as needed for cough.    Dispense:  20 capsule    Refill:  0   guaiFENesin-codeine 100-10 MG/5ML syrup    Sig: Take 5 mLs by mouth every 6 (six) hours as needed for cough.    Dispense:  120 mL    Refill:  0      I reviewed the patients updated PMH, FH, and SocHx.    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   benzonatate (TESSALON) 100 MG capsule  Take 1 capsule (100 mg total) by mouth 2 (two) times daily as needed for cough.   budesonide-formoterol (SYMBICORT) 80-4.5 MCG/ACT inhaler Inhale 2 puffs into the lungs as needed.   buPROPion (WELLBUTRIN XL) 150 MG 24 hr tablet Take 1 tablet (150 mg total) by mouth every morning.   butalbital-acetaminophen-caffeine (FIORICET) 50-325-40 MG tablet Take 2 tablets by mouth as needed.   cetirizine (ZYRTEC) 10 MG tablet Take 10 mg by mouth daily.   Continuous Blood Gluc Receiver (FREESTYLE LIBRE READER) DEVI Use up to 4 times daily to check blood sugar.   Continuous Blood Gluc Sensor (FREESTYLE LIBRE SENSOR SYSTEM) MISC USe up to 4 times daily to check blood sugar.   cyanocobalamin (COBAL-1000) 1000 MCG/ML injection Inject 1 mL (1,000 mcg total) into the muscle every 30 (thirty) days. PT may self adminster   cyclobenzaprine (FLEXERIL) 10 MG tablet Take by mouth.   diclofenac sodium (VOLTAREN) 1 % GEL Apply 2 g topically 4 (four) times daily.   DULoxetine (CYMBALTA) 60 MG capsule Take 1 capsule (60 mg total) by mouth daily.   fish oil-omega-3 fatty acids 1000 MG capsule Take 1 g by mouth daily.   gabapentin (NEURONTIN) 100 MG capsule Take 1 capsule (100 mg total) by mouth at bedtime.   guaiFENesin-codeine 100-10 MG/5ML syrup Take 5 mLs by mouth every 6 (six) hours as needed for cough.   insulin glargine, 2 Unit Dial, (TOUJEO MAX SOLOSTAR) 300 UNIT/ML Solostar Pen Inject 14 Units into the skin daily.   Insulin Pen Needle 32G X 6 MM MISC 1 each by Does not apply route daily.   methotrexate 2.5 MG tablet    Multiple Vitamin (MULTIVITAMIN) capsule Take 1 capsule by mouth daily.   Olopatadine HCl 0.2 % SOLN Apply to eye as needed.   ondansetron (ZOFRAN ODT) 4 MG disintegrating tablet Take 1 tablet (4 mg total) by mouth every 8 (eight) hours as needed for nausea or vomiting.   simvastatin (ZOCOR) 40 MG tablet Take 1 tablet (40 mg total) by mouth every other day.   traZODone (DESYREL) 50 MG tablet TAKE 1  TABLET EVERY NIGHT AT BEDTIME  - GENERIC FOR  DESYREL   Triamcinolone Acetonide (TRIAMCINOLONE 0.1 % CREAM : EUCERIN) CREA Apply 1 application topically 2 (two) times daily.   valACYclovir (VALTREX) 500 MG tablet Take 1 tablet (500 mg total) by mouth 2 (two) times daily.    Allergies: Patient is allergic to citrullus vulgaris. Family History: Patient family history includes Arthritis in her mother, paternal grandfather, and paternal grandmother; Cancer in her father, mother, and paternal grandfather; Colon cancer in her mother and paternal uncle; Diabetes in her paternal uncle; Esophageal  cancer in her maternal aunt; Heart disease in her paternal grandmother. Social History:  Patient  reports that she quit smoking about 43 years ago. Her smoking use included cigarettes. She has never used smokeless tobacco. She reports that she does not drink alcohol and does not use drugs.  Review of Systems: Constitutional: Negative for fever malaise or anorexia Cardiovascular: negative for chest pain Respiratory: negative for SOB or persistent cough Gastrointestinal: negative for abdominal pain  Objective  Vitals: BP 132/88   Pulse 75   Temp 98.4 F (36.9 C) (Temporal)   Wt 143 lb (64.9 kg)   SpO2 98%   BMI 24.17 kg/m  General: no acute distress , A&Ox3, spasm coughing present.  Speaks in clear sentences HEENT: PEERL, conjunctiva normal, neck is supple, no lymphadenopathy Cardiovascular:  RRR without murmur or gallop.  Respiratory:  Good breath sounds bilaterally, CTAB with normal respiratory effort, deep breathing does initiate coughing Gastrointestinal: soft, flat abdomen, normal active bowel sounds, no palpable masses, no hepatosplenomegaly, no appreciated hernias Minimal suprapubic tenderness present without rebound or guarding Skin:  Warm, no rashes Extremities: No edema, no calf tenderness, no cords   Commons side effects, risks, benefits, and alternatives for medications and treatment  plan prescribed today were discussed, and the patient expressed understanding of the given instructions. Patient is instructed to call or message via MyChart if he/she has any questions or concerns regarding our treatment plan. No barriers to understanding were identified. We discussed Red Flag symptoms and signs in detail. Patient expressed understanding regarding what to do in case of urgent or emergency type symptoms.  Medication list was reconciled, printed and provided to the patient in AVS. Patient instructions and summary information was reviewed with the patient as documented in the AVS. This note was prepared with assistance of Dragon voice recognition software. Occasional wrong-word or sound-a-like substitutions may have occurred due to the inherent limitations of voice recognition software  This visit occurred during the SARS-CoV-2 public health emergency.  Safety protocols were in place, including screening questions prior to the visit, additional usage of staff PPE, and extensive cleaning of exam room while observing appropriate contact time as indicated for disinfecting solutions.

## 2020-10-24 NOTE — Progress Notes (Signed)
Please call patient: I have reviewed his/her lab results. Blood counts all look fine. No worrisome sign of serious infection.  Will await chest xray and urine culture.

## 2020-10-24 NOTE — Patient Instructions (Signed)
Please return in one week for recheck with Dr. Jerline Pain.   Please go to our [facility] office to get your xrays done. You can walk in M-F between 8:30am- noon or 1pm - 5pm. Tell them you are there for xrays ordered by me. They will send me the results, then I will let you know the results with instructions.   Address: [address]  The Xray department is located in the basement.   Please take the antibiotics and cough medications as directed.   If you have any questions or concerns, please don't hesitate to send me a message via MyChart or call the office at [phone]. Thank you for visiting with [place] today! It's our pleasure caring for you.

## 2020-10-26 LAB — URINE CULTURE
MICRO NUMBER:: 12119329
SPECIMEN QUALITY:: ADEQUATE

## 2020-10-29 ENCOUNTER — Encounter: Payer: Self-pay | Admitting: Family Medicine

## 2020-10-29 ENCOUNTER — Other Ambulatory Visit: Payer: Self-pay

## 2020-10-29 NOTE — Telephone Encounter (Signed)
Patient called in and wanted to see if she could be seen sooner with Dr. Jerline Pain. Dr. Jerline Pain is fully booked and would like to worked in . Please advise.

## 2020-10-29 NOTE — Telephone Encounter (Signed)
Patient has been scheduled

## 2020-10-29 NOTE — Telephone Encounter (Signed)
Please see Dr. Marigene Ehlers message and schedule.

## 2020-10-29 NOTE — Telephone Encounter (Signed)
LVM for patient to call back to schedule

## 2020-10-29 NOTE — Telephone Encounter (Signed)
Dr. Jerline Pain, please advise where to schedule pt?

## 2020-10-30 ENCOUNTER — Ambulatory Visit (INDEPENDENT_AMBULATORY_CARE_PROVIDER_SITE_OTHER): Payer: Medicare Other | Admitting: Family Medicine

## 2020-10-30 ENCOUNTER — Other Ambulatory Visit: Payer: Self-pay

## 2020-10-30 ENCOUNTER — Encounter: Payer: Self-pay | Admitting: Family Medicine

## 2020-10-30 VITALS — BP 107/72 | HR 99 | Temp 98.2°F | Ht 64.5 in | Wt 139.2 lb

## 2020-10-30 DIAGNOSIS — N390 Urinary tract infection, site not specified: Secondary | ICD-10-CM

## 2020-10-30 MED ORDER — AMOXICILLIN-POT CLAVULANATE 875-125 MG PO TABS: 1.0000 | ORAL_TABLET | Freq: Two times a day (BID) | ORAL | 0 refills | Status: DC

## 2020-10-30 NOTE — Progress Notes (Addendum)
   Rhonda Farley is a 70 y.o. female who presents today for an office visit.  Assessment/Plan:  New/Acute Problems: Enterococcus UTI Will start Augmentin.  She is having intermittent fevers at night. Her vital signs are within normal ranges today and has no other systemic symptoms or other concerning signs for pyelonephritis.  The Augmentin should hopefully treat both her Enterococcus UTI as well as potential bacterial causes of bronchitis that were not treated by the azithromycin.  Encouraged good oral hydration.  Discussed reasons to return to care and seek emergent care.  If not improving will need to get CT scan to rule out pyelonephritis.    Subjective:  HPI:  Pt here for follow up.  Several days of febrile illness.  Had x-ray recently which showed bronchitis.  Started on azithromycin.  Symptoms have persisted.  Additionally had urine culture which was positive for Enterococcus.   She notes having a fever of 100-101 every night, as well as sweating. In addition to this she has been having issues with SOB, and persistent fatigue.       Objective:  Physical Exam: There were no vitals taken for this visit.  Gen: No acute distress, resting comfortably CV: Regular rate and rhythm with no murmurs appreciated Pulm: Normal work of breathing, clear to auscultation bilaterally with no crackles, wheezes, or rhonchi MSK: No CVA tenderness Neuro: Grossly normal, moves all extremities Psych: Normal affect and thought content      I,Jordan Kelly,acting as a scribe for Dimas Chyle, MD.,have documented all relevant documentation on the behalf of Dimas Chyle, MD,as directed by  Dimas Chyle, MD while in the presence of Dimas Chyle, MD.  I, Dimas Chyle, MD, have reviewed all documentation for this visit. The documentation on 10/30/20 for the exam, diagnosis, procedures, and orders are all accurate and complete.  Algis Greenhouse. Jerline Pain, MD 10/30/2020 7:53 AM

## 2020-10-30 NOTE — Patient Instructions (Signed)
It was very nice to see you today!  Please start the Augmentin.  This will treat any leftover bronchitis and also treat her UTI.  Please make sure that you are getting plenty of fluids.  Send a message in a few days let me know how you are doing.  Take care, Dr Jerline Pain  PLEASE NOTE:  If you had any lab tests please let us know if you have not heard back within a few days. You may see your results on mychart before we have a chance to review them but we will give you a call once they are reviewed by [place]. If we ordered any referrals today, please let us know if you have not heard from their office within the next week.   Please try these tips to maintain a healthy lifestyle:  Eat at least 3 REAL meals and 1-2 snacks per day.  Aim for no more than 5 hours between eating.  If you eat breakfast, please do so within one hour of getting up.   Each meal should contain half fruits/vegetables, one quarter protein, and one quarter carbs (no bigger than a computer mouse)  Cut down on sweet beverages. This includes juice, soda, and sweet tea.   Drink at least 1 glass of water with each meal and aim for at least 8 glasses per day  Exercise at least 150 minutes every week.

## 2020-11-04 ENCOUNTER — Ambulatory Visit: Payer: Medicare Other | Admitting: Family Medicine

## 2020-11-11 ENCOUNTER — Ambulatory Visit (INDEPENDENT_AMBULATORY_CARE_PROVIDER_SITE_OTHER): Payer: Medicare Other | Admitting: Family Medicine

## 2020-11-11 ENCOUNTER — Other Ambulatory Visit: Payer: Self-pay

## 2020-11-11 VITALS — BP 115/74 | HR 110 | Temp 98.4°F | Ht 64.5 in | Wt 138.5 lb

## 2020-11-11 DIAGNOSIS — R238 Other skin changes: Secondary | ICD-10-CM | POA: Diagnosis not present

## 2020-11-11 DIAGNOSIS — E785 Hyperlipidemia, unspecified: Secondary | ICD-10-CM

## 2020-11-11 DIAGNOSIS — E119 Type 2 diabetes mellitus without complications: Secondary | ICD-10-CM

## 2020-11-11 DIAGNOSIS — E1169 Type 2 diabetes mellitus with other specified complication: Secondary | ICD-10-CM

## 2020-11-11 DIAGNOSIS — F5101 Primary insomnia: Secondary | ICD-10-CM | POA: Diagnosis not present

## 2020-11-11 LAB — POCT GLYCOSYLATED HEMOGLOBIN (HGB A1C): Hemoglobin A1C: 6.9 % — AB (ref 4.0–5.6)

## 2020-11-11 MED ORDER — TOUJEO MAX SOLOSTAR 300 UNIT/ML ~~LOC~~ SOPN: 16.0000 [IU] | PEN_INJECTOR | Freq: Every day | SUBCUTANEOUS | 5 refills | Status: DC

## 2020-11-11 MED ORDER — OZEMPIC (0.25 OR 0.5 MG/DOSE) 2 MG/1.5ML ~~LOC~~ SOPN: 0.2500 mg | PEN_INJECTOR | SUBCUTANEOUS | 1 refills | Status: DC

## 2020-11-11 MED ORDER — FREESTYLE LIBRE READER DEVI: 0 refills | Status: DC

## 2020-11-11 MED ORDER — TRAZODONE HCL 50 MG PO TABS: ORAL_TABLET | ORAL | 0 refills | Status: DC

## 2020-11-11 MED ORDER — FREESTYLE LIBRE SENSOR SYSTEM MISC: 0 refills | Status: DC

## 2020-11-11 NOTE — Assessment & Plan Note (Signed)
A1c at goal 6.9 however has had labile readings 150s to 200s over the last several weeks.  She had increased her dose of Toujeo to 20 units daily but is still having elevated readings.  She is also had a few readings in the 29s.  We will start Ozempic 0.25 mg weekly.  Hopefully this will help mostly with her high readings.  She will back down Toujeo to 16 units daily.  She will send me message in a few weeks to let me know how she is doing with these changes.  We can recheck A1c in 3 months.  Has not tolerated metformin in the past.

## 2020-11-11 NOTE — Assessment & Plan Note (Signed)
Trazodone refilled.

## 2020-11-11 NOTE — Progress Notes (Signed)
   Rhonda Farley is a 70 y.o. female who presents today for an office visit.  Assessment/Plan:  New/Acute Problems: Dusky toes Increased risk for PAD given history of type 2 diabetes and former smoker.  We will check ABIs.  Chronic Problems Addressed Today: Type 2 diabetes mellitus without complication ([name]) 123456 at goal 6.9 however has had labile readings 150s to 200s over the last several weeks.  She had increased her dose of Toujeo to 20 units daily but is still having elevated readings.  She is also had a few readings in the 32s.  We will start Ozempic 0.25 mg weekly.  Hopefully this will help mostly with her high readings.  She will back down Toujeo to 16 units daily.  She will send me message in a few weeks to let me know how she is doing with these changes.  We can recheck A1c in 3 months.  Has not tolerated metformin in the past.  Primary insomnia Trazodone refilled.     Subjective:  HPI:  See A/P for status of chronic conditions  Also concerned about dusky discoloration to bilateral feet left worse than right.  No pain.  No pain with walking.       Objective:  Physical Exam: BP 115/74   Pulse (!) 110   Temp 98.4 F (36.9 C) (Temporal)   Ht 5' 4.5" (1.638 m)   Wt 138 lb 8 oz (62.8 kg)   SpO2 97%   BMI 23.41 kg/m   Gen: No acute distress, resting comfortably CV: Regular rate and rhythm with no murmurs appreciated Pulm: Normal work of breathing, clear to auscultation bilaterally with no crackles, wheezes, or rhonchi MSK: Left toes purple in coloration.  2+ posterior tibialis pulse however dorsalis pedis pulse not palpable.  Sluggish cap refill approximately 6 to 7 seconds throughout all toes. Neuro: Grossly normal, moves all extremities Psych: Normal affect and thought content      Demontrae Gilbert M. Jerline Pain, MD 11/11/2020 1:22 PM

## 2020-11-11 NOTE — Patient Instructions (Signed)
It was very nice to see you today!  Please start the [place].  Take 0.25 mg once weekly.  This will help prevent you from having large spikes in your blood sugar.  You can decrease your insulin back down to 16 units.  Please send a message in a few weeks let me know how this is working.  I like to see you back in about 3 months to recheck your A1c.  We will also order ABIs to make sure that you are getting adequate blood flow to your feet.  Take care, Dr Jerline Pain  PLEASE NOTE:  If you had any lab tests please let us know if you have not heard back within a few days. You may see your results on mychart before we have a chance to review them but we will give you a call once they are reviewed by [place]. If we ordered any referrals today, please let us know if you have not heard from their office within the next week.   Please try these tips to maintain a healthy lifestyle:  Eat at least 3 REAL meals and 1-2 snacks per day.  Aim for no more than 5 hours between eating.  If you eat breakfast, please do so within one hour of getting up.   Each meal should contain half fruits/vegetables, one quarter protein, and one quarter carbs (no bigger than a computer mouse)  Cut down on sweet beverages. This includes juice, soda, and sweet tea.   Drink at least 1 glass of water with each meal and aim for at least 8 glasses per day  Exercise at least 150 minutes every week.

## 2020-11-12 ENCOUNTER — Ambulatory Visit (HOSPITAL_COMMUNITY)
Admission: RE | Admit: 2020-11-12 | Discharge: 2020-11-12 | Disposition: A | Discharge status: Home / Self Care | Payer: Medicare Other | Source: Ambulatory Visit | Attending: Family Medicine | Admitting: Family Medicine

## 2020-11-12 DIAGNOSIS — R238 Other skin changes: Secondary | ICD-10-CM | POA: Insufficient documentation

## 2020-11-13 ENCOUNTER — Other Ambulatory Visit: Payer: Self-pay

## 2020-11-13 DIAGNOSIS — R238 Other skin changes: Secondary | ICD-10-CM

## 2020-11-13 NOTE — Progress Notes (Signed)
Please inform patient of the following:  She had abnormal result in her testing for blood flow.  I do not think we need to do anything urgently however I would recommend that she get evaluated by vascular surgery.  Please place referral.

## 2020-11-25 ENCOUNTER — Other Ambulatory Visit: Payer: Self-pay | Admitting: Family Medicine

## 2020-12-02 ENCOUNTER — Other Ambulatory Visit: Payer: Self-pay | Admitting: Family Medicine

## 2020-12-05 DIAGNOSIS — L400 Psoriasis vulgaris: Secondary | ICD-10-CM | POA: Diagnosis not present

## 2020-12-30 ENCOUNTER — Encounter: Payer: Self-pay | Admitting: Surgery

## 2020-12-30 ENCOUNTER — Ambulatory Visit (INDEPENDENT_AMBULATORY_CARE_PROVIDER_SITE_OTHER): Payer: Medicare Other | Admitting: Surgery

## 2020-12-30 ENCOUNTER — Other Ambulatory Visit: Payer: Self-pay

## 2020-12-30 VITALS — BP 115/77 | HR 88 | Temp 98.0°F | Resp 20 | Ht 64.5 in | Wt 139.0 lb

## 2020-12-30 DIAGNOSIS — I70213 Atherosclerosis of native arteries of extremities with intermittent claudication, bilateral legs: Secondary | ICD-10-CM | POA: Diagnosis not present

## 2020-12-30 NOTE — Progress Notes (Signed)
[facility]in Specialist of [facility]  Patient name: Rhonda Farley MRN: [id number] DOB: 02-03-1951 Sex: female   REQUESTING PROVIDER:    Dimas Chyle     REASON FOR CONSULT:    PAD  HISTORY OF PRESENT ILLNESS:   Rhonda Farley is a 70 y.o. female, who is referred today because of a bluish discoloration in her feet.  She states that they do not bother her other than some neuropathy.  She denies claudication.  She denies open wounds.  The patient suffers from diabetes.  She is a former smoker.  She is medically managed for hypertension.  She takes a statin for hypercholesterolemia.  PAST MEDICAL HISTORY    Past Medical History:  Diagnosis Date   Allergy    Arthritis    Asthma    Cancer ([place])    sebaceous cancer dx. 2008   Cancer of sebaceous glands    Chronic headaches    Depression    Diabetes mellitus    Fibromyalgia    GERD (gastroesophageal reflux disease)    Hyperlipidemia    Hypertension    no per patient   Migraines    Obesity    Pneumonia    Shingles    Tachycardia    Thyroid disease      FAMILY HISTORY   Family History  Problem Relation Age of Onset   Arthritis Mother    Cancer Mother        colon/skin   Colon cancer Mother    Cancer Father        lung/prostate   Diabetes Paternal Uncle    Colon cancer Paternal Uncle    Arthritis Paternal Grandmother    Heart disease Paternal Grandmother    Arthritis Paternal Grandfather    Cancer Paternal Grandfather        prostate   Esophageal cancer Maternal Aunt     SOCIAL HISTORY:   Social History   Socioeconomic History   Marital status: Married    Spouse name: Not on file   Number of children: Not on file   Years of education: Not on file   Highest education level: Not on file  Occupational History   Not on file  Tobacco Use   Smoking status: Former    Types: Cigarettes    Quit date: 04/13/1977    Years since quitting: 43.7   Smokeless tobacco:  Never  Vaping Use   Vaping Use: Never used  Substance and Sexual Activity   Alcohol use: No   Drug use: No   Sexual activity: Not on file  Other Topics Concern   Not on file  Social History Narrative   Not on file   Social Determinants of Health   Financial Resource Strain: Not on file  Food Insecurity: Not on file  Transportation Needs: Not on file  Physical Activity: Not on file  Stress: Not on file  Social Connections: Not on file  Intimate Partner Violence: Not on file    ALLERGIES:    Allergies  Allergen Reactions   Citrullus Vulgaris Anaphylaxis    Watermelon    CURRENT MEDICATIONS:    Current Outpatient Medications  Medication Sig Dispense Refill   B Complex-C (SUPER B COMPLEX PO) Take by mouth daily.     budesonide-formoterol (SYMBICORT) 80-4.5 MCG/ACT inhaler Inhale 2 puffs into the lungs as needed. 10.2 g 5   buPROPion (WELLBUTRIN XL) 150 MG 24 hr tablet Take 1 tablet (150 mg total) by mouth every morning.  90 tablet 3   butalbital-acetaminophen-caffeine (FIORICET) 50-325-40 MG tablet Take 2 tablets by mouth as needed. 30 tablet 3   cetirizine (ZYRTEC) 10 MG tablet Take 10 mg by mouth daily.     Continuous Blood Gluc Receiver (FREESTYLE LIBRE READER) DEVI Use up to 4 times daily to check blood sugar. 1 each 0   Continuous Blood Gluc Sensor (FREESTYLE LIBRE 2 SENSOR) MISC USE AS DIRECTED 1 each 1   cyanocobalamin (COBAL-1000) 1000 MCG/ML injection Inject 1 mL (1,000 mcg total) into the muscle every 30 (thirty) days. PT may self adminster 10 mL 3   cyclobenzaprine (FLEXERIL) 10 MG tablet Take by mouth.     diclofenac sodium (VOLTAREN) 1 % GEL Apply 2 g topically 4 (four) times daily. 2 Tube 2   DULoxetine (CYMBALTA) 60 MG capsule Take 1 capsule (60 mg total) by mouth daily. 90 capsule 3   gabapentin (NEURONTIN) 100 MG capsule Take 1 capsule (100 mg total) by mouth at bedtime. 90 capsule 3   insulin glargine, 2 Unit Dial, (TOUJEO MAX SOLOSTAR) 300 UNIT/ML  Solostar Pen Inject 16 Units into the skin daily. 15 mL 5   Insulin Pen Needle 32G X 6 MM MISC 1 each by Does not apply route daily. 100 each 2   methotrexate 2.5 MG tablet      Multiple Vitamin (MULTIVITAMIN) capsule Take 1 capsule by mouth daily.     Olopatadine HCl 0.2 % SOLN Apply to eye as needed.     ondansetron (ZOFRAN ODT) 4 MG disintegrating tablet Take 1 tablet (4 mg total) by mouth every 8 (eight) hours as needed for nausea or vomiting. 20 tablet 0   OZEMPIC, 0.25 OR 0.5 MG/DOSE, 2 MG/1.5ML SOPN INJECT 0.'25MG'$  INTO THE SKIN ONE TIME PER WEEK 1.5 mL 3   simvastatin (ZOCOR) 40 MG tablet Take 1 tablet (40 mg total) by mouth every other day. 90 tablet 1   traZODone (DESYREL) 50 MG tablet TAKE 1 TABLET EVERY NIGHT AT BEDTIME  - GENERIC FOR  DESYREL 90 tablet 0   Triamcinolone Acetonide (TRIAMCINOLONE 0.1 % CREAM : EUCERIN) CREA Apply 1 application topically 2 (two) times daily. 1 each 0   valACYclovir (VALTREX) 500 MG tablet Take 1 tablet (500 mg total) by mouth 2 (two) times daily. 180 tablet 3   No current facility-administered medications for this visit.    REVIEW OF SYSTEMS:   '[X]'$  denotes positive finding, '[ ]'$  denotes negative finding Cardiac  Comments:  Chest pain or chest pressure:    Shortness of breath upon exertion:    Short of breath when lying flat:    Irregular heart rhythm:        Vascular    Pain in calf, thigh, or hip brought on by ambulation:    Pain in feet at night that wakes you up from your sleep:  x   Blood clot in your veins:    Leg swelling:         Pulmonary    Oxygen at home:    Productive cough:     Wheezing:         Neurologic    Sudden weakness in arms or legs:     Sudden numbness in arms or legs:     Sudden onset of difficulty speaking or slurred speech:    Temporary loss of vision in one eye:     Problems with dizziness:         Gastrointestinal    Blood in stool:  Vomited blood:         Genitourinary    Burning when urinating:      Blood in urine:        Psychiatric    Major depression:         Hematologic    Bleeding problems:    Problems with blood clotting too easily:        Skin    Rashes or ulcers:        Constitutional    Fever or chills:     PHYSICAL EXAM:   Vitals:   12/30/20 1107  BP: 115/77  Pulse: 88  Resp: 20  Temp: 98 F (36.7 C)  SpO2: 96%  Weight: 139 lb (63 kg)  Height: 5' 4.5" (1.638 m)    GENERAL: The patient is a well-nourished female, in no acute distress. The vital signs are documented above. CARDIAC: There is a regular rate and rhythm.  VASCULAR: Palpable posterior tibial pulses bilaterally. PULMONARY: Nonlabored respirations ABDOMEN: Soft and non-tender with normal pitched bowel sounds.  MUSCULOSKELETAL: There are no major deformities or cyanosis. NEUROLOGIC: No focal weakness or paresthesias are detected. SKIN: slight bluish discoloration of both feet and the toes. PSYCHIATRIC: The patient has a normal affect.  STUDIES:   I have reviewed the following studies: +-------+-----------+-----------+------------+------------+  ABI/TBIToday's ABIToday's TBIPrevious ABIPrevious TBI  +-------+-----------+-----------+------------+------------+  Right  1.17       0.56                                 +-------+-----------+-----------+------------+------------+  Left   1.14       0.71                                 +-------+-----------+-----------+------------+------------+   Right great toe pressures is 72 Left great toe pressure is 90 ASSESSMENT and PLAN   I discussed with the patient that she has essentially normal ABIs.  The left leg is triphasic, the right is biphasic.  I am able to palpate both posterior tibial arteries.  Most of her disease is out onto the foot as evidenced by her toe pressures.  This is likely secondary to her diabetes.  I would not recommend any intervention from a vascular perspective.  I did discuss the importance of daily  examination of her feet and to contact me should she developed a wound that does not appear to be healing.  She will follow-up as needed   Leia Alf, MD, FACS [facility] [phone] Pager [phone]

## 2020-12-31 ENCOUNTER — Telehealth: Payer: Self-pay | Admitting: Family Medicine

## 2020-12-31 NOTE — Telephone Encounter (Signed)
Copied from [place] [id number]. Topic: Medicare AWV >> Dec 31, 2020  9:36 AM Harris-Coley, Hannah Beat wrote: Reason for CRM: Left message for patient to schedule Annual Wellness Visit.  Please schedule with Nurse Health Advisor Charlott Rakes, RN at [facility].  Please call [phone] ask for [facility]

## 2021-01-02 ENCOUNTER — Other Ambulatory Visit: Payer: Self-pay | Admitting: Family Medicine

## 2021-01-07 ENCOUNTER — Other Ambulatory Visit: Payer: Self-pay | Admitting: Family Medicine

## 2021-01-13 ENCOUNTER — Other Ambulatory Visit: Payer: Self-pay

## 2021-01-13 ENCOUNTER — Ambulatory Visit (INDEPENDENT_AMBULATORY_CARE_PROVIDER_SITE_OTHER): Payer: Medicare Other

## 2021-01-13 DIAGNOSIS — Z0000 Encounter for general adult medical examination without abnormal findings: Secondary | ICD-10-CM | POA: Diagnosis not present

## 2021-01-13 NOTE — Patient Instructions (Signed)
Ms. Rhonda Farley , Thank you for taking time to come for your Medicare Wellness Visit. I appreciate your ongoing commitment to your health goals. Please review the following plan we discussed and let me know if I can assist you in the future.   Screening recommendations/referrals: Colonoscopy: Pt stated she will make appt Mammogram: Pt stated she will make appt  Bone Density: Done 09/24/09 repeat every 2 years  Recommended yearly ophthalmology/optometry visit for glaucoma screening and checkup Recommended yearly dental visit for hygiene and checkup  Vaccinations: Influenza vaccine: pt stated completed  Pneumococcal vaccine: Done 05/06/17 Tdap vaccine: Done 05/06/17 repeat every 10 years  Shingles vaccine: Shingrix discussed. Please contact your pharmacy for coverage information.    Covid-19:Completed 2/28, 07/11/19 & 04/16/20   Advanced directives: Please bring a copy of your health care power of attorney and living will to the office at your convenience.  Conditions/risks identified: Keep blood sugars under control  Next appointment: Follow up in one year for your annual wellness visit    Preventive Care 65 Years and Older, Female Preventive care refers to lifestyle choices and visits with your health care provider that can promote health and wellness. What does preventive care include? A yearly physical exam. This is also called an annual well check. Dental exams once or twice a year. Routine eye exams. Ask your health care provider how often you should have your eyes checked. Personal lifestyle choices, including: Daily care of your teeth and gums. Regular physical activity. Eating a healthy diet. Avoiding tobacco and drug use. Limiting alcohol use. Practicing safe sex. Taking low-dose aspirin every day. Taking vitamin and mineral supplements as recommended by your health care provider. What happens during an annual well check? The services and screenings done by your health care  provider during your annual well check will depend on your age, overall health, lifestyle risk factors, and family history of disease. Counseling  Your health care provider may ask you questions about your: Alcohol use. Tobacco use. Drug use. Emotional well-being. Home and relationship well-being. Sexual activity. Eating habits. History of falls. Memory and ability to understand (cognition). Work and work Statistician. Reproductive health. Screening  You may have the following tests or measurements: Height, weight, and BMI. Blood pressure. Lipid and cholesterol levels. These may be checked every 5 years, or more frequently if you are over 1 years old. Skin check. Lung cancer screening. You may have this screening every year starting at age 60 if you have a 30-pack-year history of smoking and currently smoke or have quit within the past 15 years. Fecal occult blood test (FOBT) of the stool. You may have this test every year starting at age 68. Flexible sigmoidoscopy or colonoscopy. You may have a sigmoidoscopy every 5 years or a colonoscopy every 10 years starting at age 66. Hepatitis C blood test. Hepatitis B blood test. Sexually transmitted disease (STD) testing. Diabetes screening. This is done by checking your blood sugar (glucose) after you have not eaten for a while (fasting). You may have this done every 1-3 years. Bone density scan. This is done to screen for osteoporosis. You may have this done starting at age 83. Mammogram. This may be done every 1-2 years. Talk to your health care provider about how often you should have regular mammograms. Talk with your health care provider about your test results, treatment options, and if necessary, the need for more tests. Vaccines  Your health care provider may recommend certain vaccines, such as: Influenza vaccine. This is  recommended every year. Tetanus, diphtheria, and acellular pertussis (Tdap, Td) vaccine. You may need a Td  booster every 10 years. Zoster vaccine. You may need this after age 38. Pneumococcal 13-valent conjugate (PCV13) vaccine. One dose is recommended after age 74. Pneumococcal polysaccharide (PPSV23) vaccine. One dose is recommended after age 39. Talk to your health care provider about which screenings and vaccines you need and how often you need them. This information is not intended to replace advice given to you by your health care provider. Make sure you discuss any questions you have with your health care provider. Document Released: 04/26/2015 Document Revised: 12/18/2015 Document Reviewed: 01/29/2015 Elsevier Interactive Patient Education  2017 [place] Prevention in the Home Falls can cause injuries. They can happen to people of all ages. There are many things you can do to make your home safe and to help prevent falls. What can I do on the outside of my home? Regularly fix the edges of walkways and driveways and fix any cracks. Remove anything that might make you trip as you walk through a door, such as a raised step or threshold. Trim any bushes or trees on the path to your home. Use bright outdoor lighting. Clear any walking paths of anything that might make someone trip, such as rocks or tools. Regularly check to see if handrails are loose or broken. Make sure that both sides of any steps have handrails. Any raised decks and porches should have guardrails on the edges. Have any leaves, snow, or ice cleared regularly. Use sand or salt on walking paths during winter. Clean up any spills in your garage right away. This includes oil or grease spills. What can I do in the bathroom? Use night lights. Install grab bars by the toilet and in the tub and shower. Do not use towel bars as grab bars. Use non-skid mats or decals in the tub or shower. If you need to sit down in the shower, use a plastic, non-slip stool. Keep the floor dry. Clean up any water that spills on the floor  as soon as it happens. Remove soap buildup in the tub or shower regularly. Attach bath mats securely with double-sided non-slip rug tape. Do not have throw rugs and other things on the floor that can make you trip. What can I do in the bedroom? Use night lights. Make sure that you have a light by your bed that is easy to reach. Do not use any sheets or blankets that are too big for your bed. They should not hang down onto the floor. Have a firm chair that has side arms. You can use this for support while you get dressed. Do not have throw rugs and other things on the floor that can make you trip. What can I do in the kitchen? Clean up any spills right away. Avoid walking on wet floors. Keep items that you use a lot in easy-to-reach places. If you need to reach something above you, use a strong step stool that has a grab bar. Keep electrical cords out of the way. Do not use floor polish or wax that makes floors slippery. If you must use wax, use non-skid floor wax. Do not have throw rugs and other things on the floor that can make you trip. What can I do with my stairs? Do not leave any items on the stairs. Make sure that there are handrails on both sides of the stairs and use them. Fix handrails that are  broken or loose. Make sure that handrails are as long as the stairways. Check any carpeting to make sure that it is firmly attached to the stairs. Fix any carpet that is loose or worn. Avoid having throw rugs at the top or bottom of the stairs. If you do have throw rugs, attach them to the floor with carpet tape. Make sure that you have a light switch at the top of the stairs and the bottom of the stairs. If you do not have them, ask someone to add them for you. What else can I do to help prevent falls? Wear shoes that: Do not have high heels. Have rubber bottoms. Are comfortable and fit you well. Are closed at the toe. Do not wear sandals. If you use a stepladder: Make sure that it is  fully opened. Do not climb a closed stepladder. Make sure that both sides of the stepladder are locked into place. Ask someone to hold it for you, if possible. Clearly mark and make sure that you can see: Any grab bars or handrails. First and last steps. Where the edge of each step is. Use tools that help you move around (mobility aids) if they are needed. These include: Canes. Walkers. Scooters. Crutches. Turn on the lights when you go into a dark area. Replace any light bulbs as soon as they burn out. Set up your furniture so you have a clear path. Avoid moving your furniture around. If any of your floors are uneven, fix them. If there are any pets around you, be aware of where they are. Review your medicines with your doctor. Some medicines can make you feel dizzy. This can increase your chance of falling. Ask your doctor what other things that you can do to help prevent falls. This information is not intended to replace advice given to you by your health care provider. Make sure you discuss any questions you have with your health care provider. Document Released: 01/24/2009 Document Revised: 09/05/2015 Document Reviewed: 05/04/2014 Elsevier Interactive Patient Education  2017 [employer].

## 2021-01-13 NOTE — Progress Notes (Addendum)
Virtual Visit via Telephone Note  I connected with  Rhonda Farley on 01/13/21 at 11:00 AM EDT by telephone and verified that I am speaking with the correct person using two identifiers.  Medicare Annual Wellness visit completed telephonically due to Covid-19 pandemic.   Persons participating in this call: This Health Coach and this patient.   Location: Patient: home Provider: office   I discussed the limitations, risks, security and privacy concerns of performing an evaluation and management service by telephone and the availability of in person appointments. The patient expressed understanding and agreed to proceed.  Unable to perform video visit due to video visit attempted and failed and/or patient does not have video capability.   Some vital signs may be absent or patient reported.   Willette Brace, LPN   Subjective:   Rhonda Farley is a 70 y.o. female who presents for an Initial Medicare Annual Wellness Visit.  Review of Systems     Cardiac Risk Factors include: advanced age (>8men, >74 women);hypertension;diabetes mellitus;dyslipidemia     Objective:    Today's Vitals   01/13/21 1118  PainSc: 7    There is no height or weight on file to calculate BMI.  Advanced Directives 01/13/2021  Does Patient Have a Medical Advance Directive? Yes  Type of Paramedic of [place];Living will  Copy of [place] in Chart? No - copy requested    Current Medications (verified) Outpatient Encounter Medications as of 01/13/2021  Medication Sig   B Complex-C (SUPER B COMPLEX PO) Take by mouth daily.   budesonide-formoterol (SYMBICORT) 80-4.5 MCG/ACT inhaler Inhale 2 puffs into the lungs as needed.   buPROPion (WELLBUTRIN XL) 150 MG 24 hr tablet TAKE 1 TABLET BY MOUTH EVERY DAY IN THE MORNING   butalbital-acetaminophen-caffeine (FIORICET) 50-325-40 MG tablet TAKE 2 TABLETS BY MOUTH AS NEEDED.   CALCIUM ACETATE-MAGNESIUM CARB PO Take by  mouth. Vit d 3   cetirizine (ZYRTEC) 10 MG tablet Take 10 mg by mouth daily.   Continuous Blood Gluc Receiver (FREESTYLE LIBRE READER) DEVI Use up to 4 times daily to check blood sugar.   Continuous Blood Gluc Sensor (FREESTYLE LIBRE 2 SENSOR) MISC USE AS DIRECTED   cyanocobalamin (,VITAMIN B-12,) 1000 MCG/ML injection INJECT 1 ML (1,000 MCG TOTAL) INTO THE MUSCLE EVERY 30 (THIRTY) DAYS. PT MAY SELF ADMINSTER   cyclobenzaprine (FLEXERIL) 10 MG tablet Take by mouth.   diclofenac sodium (VOLTAREN) 1 % GEL Apply 2 g topically 4 (four) times daily.   DULoxetine (CYMBALTA) 60 MG capsule TAKE 1 CAPSULE BY MOUTH EVERY DAY   gabapentin (NEURONTIN) 100 MG capsule Take 1 capsule (100 mg total) by mouth at bedtime.   Insulin Pen Needle 32G X 6 MM MISC 1 each by Does not apply route daily.   methotrexate 2.5 MG tablet    Multiple Vitamin (MULTIVITAMIN) capsule Take 1 capsule by mouth daily.   ondansetron (ZOFRAN ODT) 4 MG disintegrating tablet Take 1 tablet (4 mg total) by mouth every 8 (eight) hours as needed for nausea or vomiting.   OZEMPIC, 0.25 OR 0.5 MG/DOSE, 2 MG/1.5ML SOPN INJECT 0.25MG  INTO THE SKIN ONE TIME PER WEEK   simvastatin (ZOCOR) 40 MG tablet Take 1 tablet (40 mg total) by mouth every other day.   TOUJEO MAX SOLOSTAR 300 UNIT/ML Solostar Pen INJECT 16 UNITS INTO THE SKIN DAILY.   traZODone (DESYREL) 50 MG tablet TAKE 1 TABLET EVERY NIGHT AT BEDTIME  - GENERIC FOR  DESYREL  Triamcinolone Acetonide (TRIAMCINOLONE 0.1 % CREAM : EUCERIN) CREA Apply 1 application topically 2 (two) times daily.   valACYclovir (VALTREX) 500 MG tablet Take 1 tablet (500 mg total) by mouth 2 (two) times daily.   Olopatadine HCl 0.2 % SOLN Apply to eye as needed. (Patient not taking: Reported on 01/13/2021)   No facility-administered encounter medications on file as of 01/13/2021.    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    Chronic headaches    Depression    Diabetes mellitus    Fibromyalgia    GERD (gastroesophageal reflux disease)    Hyperlipidemia    Hypertension    no per patient   Migraines    Obesity    Pneumonia    Shingles    Tachycardia    Thyroid disease    Past Surgical History:  Procedure Laterality Date   ABDOMINAL EXPLORATION SURGERY     BUNIONECTOMY     CERVICAL LAMINECTOMY  2000   ROTATOR CUFF REPAIR  01/2010   right   ROUX-EN-Y PROCEDURE  2009   TONSILLECTOMY     Family History  Problem Relation Age of Onset   Arthritis Mother    Cancer Mother        colon/skin   Colon cancer Mother    Cancer Father        lung/prostate   Diabetes Paternal Uncle    Colon cancer Paternal Uncle    Arthritis Paternal Grandmother    Heart disease Paternal Grandmother    Arthritis Paternal Grandfather    Cancer Paternal Grandfather        prostate   Esophageal cancer Maternal Aunt    Social History   Socioeconomic History   Marital status: Married    Spouse name: Not on file   Number of children: Not on file   Years of education: Not on file   Highest education level: Not on file  Occupational History   Not on file  Tobacco Use   Smoking status: Former    Types: Cigarettes    Quit date: 04/13/1977    Years since quitting: 43.7   Smokeless tobacco: Never  Vaping Use   Vaping Use: Never used  Substance and Sexual Activity   Alcohol use: No   Drug use: No   Sexual activity: Not on file  Other Topics Concern   Not on file  Social History Narrative   Not on file   Social Determinants of Health   Financial Resource Strain: Low Risk    Difficulty of Paying Living Expenses: Not hard at all  Food Insecurity: No Food Insecurity   Worried About Charity fundraiser in the Last Year: Never true   [place] in the Last Year: Never true  Transportation Needs: No Transportation Needs   Lack of  Transportation (Medical): No   Lack of Transportation (Non-Medical): No  Physical Activity: Sufficiently Active   Days of Exercise per Week: 5 days   Minutes of Exercise per Session: 30 min  Stress: No Stress Concern Present   Feeling of Stress : Not at all  Social Connections: Moderately Integrated   Frequency of Communication with Friends and Family: More than three times a week   Frequency of Social Gatherings with Friends and Family: More  than three times a week   Attends Religious Services: More than 4 times per year   Active Member of Clubs or Organizations: No   Attends Archivist Meetings: Never   Marital Status: Married    Tobacco Counseling Counseling given: Not Answered   Clinical Intake:  Pre-visit preparation completed: Yes  Pain : 0-10 Pain Score: 7  Pain Type: Chronic pain Pain Location: Back Pain Descriptors / Indicators: Aching Pain Onset: More than a month ago Pain Frequency: Constant     BMI - recorded: 23.5 Nutritional Status: BMI of 19-24  Normal Nutritional Risks: None Diabetes: Yes CBG done?: Yes (140) CBG resulted in Enter/ Edit results?: No Did pt. bring in CBG monitor from home?: No  How often do you need to have someone help you when you read instructions, pamphlets, or other written materials from your doctor or pharmacy?: 1 - Never  Diabetic?Nutrition Risk Assessment:  Has the patient had any N/V/D within the last 2 months?  No  Does the patient have any non-healing wounds?  No  Has the patient had any unintentional weight loss or weight gain?  No   Diabetes:  Is the patient diabetic?  Yes  If diabetic, was a CBG obtained today?  Yes  Did the patient bring in their glucometer from home?  No  How often do you monitor your CBG's? Daily.   Financial Strains and Diabetes Management:  Are you having any financial strains with the device, your supplies or your medication? No .  Does the patient want to be seen by Chronic  Care Management for management of their diabetes?  No  Would the patient like to be referred to a Nutritionist or for Diabetic Management?  No   Diabetic Exams:  Diabetic Eye Exam: Overdue for diabetic eye exam. Pt has been advised about the importance in completing this exam. Patient advised to call and schedule an eye exam. Diabetic Foot Exam: Overdue, Pt has been advised about the importance in completing this exam. Pt is scheduled for diabetic foot exam on next appt .      Information entered by :: Charlott Rakes, LPN   Activities of Daily Living In your present state of health, do you have any difficulty performing the following activities: 01/13/2021  Hearing? Y  Comment HOH  Vision? N  Difficulty concentrating or making decisions? N  Walking or climbing stairs? N  Dressing or bathing? N  Doing errands, shopping? N  Preparing Food and eating ? N  Using the Toilet? N  In the past six months, have you accidently leaked urine? N  Do you have problems with loss of bowel control? N  Managing your Medications? N  Managing your Finances? N  Housekeeping or managing your Housekeeping? N  Some recent data might be hidden    Patient Care Team: Vivi Barrack, MD as PCP - General (Family Medicine)  Indicate any recent Medical Services you may have received from other than [facility] providers in the past year (date may be approximate).     Assessment:   This is a routine wellness examination for [facility].  Hearing/Vision screen Hearing Screening - Comments:: HOH Vision Screening - Comments:: Pt follows up with dr Bridgett Larsson for annual eye exams   Dietary issues and exercise activities discussed: Current Exercise Habits: Home exercise routine, Type of exercise: walking, Time (Minutes): 30, Frequency (Times/Week): 5, Weekly Exercise (Minutes/Week): 150   Goals Addressed  This Visit's Progress    Patient Stated       Keep Blood sugar with normal range         Depression Screen PHQ 2/9 Scores 01/13/2021 11/11/2020 10/30/2020 07/11/2020  PHQ - 2 Score 0 0 0 2  PHQ- 9 Score - - 0 8    Fall Risk Fall Risk  01/13/2021 11/11/2020 10/30/2020  Falls in the past year? 1 0 0  Number falls in past yr: 1 0 0  Injury with Fall? 1 0 0  Comment broke foot - -  Risk for fall due to : Impaired vision No Fall Risks No Fall Risks  Follow up Falls prevention discussed - -    FALL RISK PREVENTION PERTAINING TO THE HOME:  Any stairs in or around the home? Yes  If so, are there any without handrails? No  Home free of loose throw rugs in walkways, pet beds, electrical cords, etc? Yes  Adequate lighting in your home to reduce risk of falls? Yes   ASSISTIVE DEVICES UTILIZED TO PREVENT FALLS:  Life alert? No  Use of a cane, walker or w/c? No  Grab bars in the bathroom? Yes  Shower chair or bench in shower? Yes  Elevated toilet seat or a handicapped toilet? No   TIMED UP AND GO:  Was the test performed? No .   Cognitive Function:     6CIT Screen 01/13/2021  What Year? 0 points  What month? 0 points  What time? 0 points  Count back from 20 0 points  Months in reverse 0 points  Repeat phrase 0 points  Total Score 0    Immunizations Immunization History  Administered Date(s) Administered   Influenza, High Dose Seasonal PF 01/31/2018   Influenza, Seasonal, Injecte, Preservative Fre 03/18/2012   Influenza,inj,Quad PF,6+ Mos 01/10/2013, 04/08/2016, 05/06/2017   PFIZER(Purple Top)SARS-COV-2 Vaccination 06/11/2019, 07/11/2019, 04/16/2020   Pneumococcal Polysaccharide-23 04/13/2002, 05/06/2017   Td 04/13/1994   Tdap 01/10/2013, 05/06/2017    TDAP status: Up to date  Flu Vaccine status: Up to date per pt will call with dates   Pneumococcal vaccine status: Up to date  Covid-19 vaccine status: Completed vaccines  Qualifies for Shingles Vaccine? Yes   Zostavax completed No   Shingrix Completed?: No.    Education has been provided regarding the  importance of this vaccine. Patient has been advised to call insurance company to determine out of pocket expense if they have not yet received this vaccine. Advised may also receive vaccine at local pharmacy or Health Dept. Verbalized acceptance and understanding.  Screening Tests Health Maintenance  Topic Date Due   Hepatitis C Screening  Never done   Zoster Vaccines- Shingrix (1 of 2) Never done   FOOT EXAM  07/01/2012   OPHTHALMOLOGY EXAM  10/21/2013   URINE MICROALBUMIN  02/21/2014   COLONOSCOPY (Pts 45-75yrs Insurance coverage will need to be confirmed)  03/15/2015   MAMMOGRAM  05/27/2017   COVID-19 Vaccine (4 - Booster for [place] series) 07/09/2020   INFLUENZA VACCINE  11/11/2020   HEMOGLOBIN A1C  05/14/2021   TETANUS/TDAP  05/07/2027   DEXA SCAN  Completed   HPV VACCINES  Aged Out    Health Maintenance  Health Maintenance Due  Topic Date Due   Hepatitis C Screening  Never done   Zoster Vaccines- Shingrix (1 of 2) Never done   FOOT EXAM  07/01/2012   OPHTHALMOLOGY EXAM  10/21/2013   URINE MICROALBUMIN  02/21/2014   COLONOSCOPY (Pts 45-77yrs Insurance coverage  will need to be confirmed)  03/15/2015   MAMMOGRAM  05/27/2017   COVID-19 Vaccine (4 - Booster for Pfizer series) 07/09/2020   INFLUENZA VACCINE  11/11/2020    Colorectal screening: Pt stated she will follow up with Gastrologist   Mammogram: pt stated she will follow up with appt   Bone Density status: Completed 09/24/09. Results reflect: Bone density results: NORMAL. Repeat every 2 years.   Additional Screening:  Hepatitis C Screening: does qualify;  Vision Screening: Recommended annual ophthalmology exams for early detection of glaucoma and other disorders of the eye. Is the patient up to date with their annual eye exam?  Yes  Who is the provider or what is the name of the office in which the patient attends annual eye exams? Dr Patrice Paradise If pt is not established with a provider, would they like to be  referred to a provider to establish care? No .   Dental Screening: Recommended annual dental exams for proper oral hygiene  Community Resource Referral / Chronic Care Management: CRR required this visit?  No   CCM required this visit?  No      Plan:     I have personally reviewed and noted the following in the patient's chart:   Medical and social history Use of alcohol, tobacco or illicit drugs  Current medications and supplements including opioid prescriptions. Patient is currently taking opioid prescriptions. Information provided to patient regarding non-opioid alternatives. Patient advised to discuss non-opioid treatment plan with their provider. Functional ability and status Nutritional status Physical activity Advanced directives List of other physicians Hospitalizations, surgeries, and ER visits in previous 12 months Vitals Screenings to include cognitive, depression, and falls Referrals and appointments  In addition, I have reviewed and discussed with patient certain preventive protocols, quality metrics, and best practice recommendations. A written personalized care plan for preventive services as well as general preventive health recommendations were provided to patient.     Willette Brace, LPN   [id number]   Nurse Notes: None

## 2021-01-17 ENCOUNTER — Encounter: Payer: Self-pay | Admitting: Family Medicine

## 2021-01-20 ENCOUNTER — Other Ambulatory Visit: Payer: Self-pay

## 2021-01-27 ENCOUNTER — Other Ambulatory Visit: Payer: Self-pay

## 2021-01-27 MED ORDER — SIMVASTATIN 40 MG PO TABS: 40.0000 mg | ORAL_TABLET | ORAL | 2 refills | Status: DC

## 2021-01-27 MED ORDER — GABAPENTIN 100 MG PO CAPS
100.0000 mg | ORAL_CAPSULE | Freq: Every day | ORAL | 3 refills | Status: DC
Start: 2021-01-27 — End: 2022-01-26

## 2021-01-27 MED ORDER — CYANOCOBALAMIN 1000 MCG/ML IJ SOLN: 1000.0000 ug | INTRAMUSCULAR | 0 refills | Status: DC

## 2021-01-27 MED ORDER — TRAZODONE HCL 100 MG PO TABS: ORAL_TABLET | ORAL | 2 refills | Status: DC

## 2021-02-07 ENCOUNTER — Other Ambulatory Visit: Payer: Self-pay | Admitting: Family Medicine

## 2021-02-11 ENCOUNTER — Other Ambulatory Visit: Payer: Self-pay | Admitting: Family Medicine

## 2021-02-13 ENCOUNTER — Ambulatory Visit: Payer: Medicare Other | Admitting: Family Medicine

## 2021-02-13 NOTE — Progress Notes (Incomplete)
   Rhonda Farley is a 70 y.o. female who presents today for an office visit.  Assessment/Plan:  New/Acute Problems: ***  Chronic Problems Addressed Today: No problem-specific Assessment & Plan notes found for this encounter.     Subjective:  HPI:  She is here to follow up. Last seen in the office on 11/11/2020.   She has had issue with dusky discoloration to bilateral feet in the last visit.        Objective:  Physical Exam: There were no vitals taken for this visit.  Gen: No acute distress, resting comfortably*** CV: Regular rate and rhythm with no murmurs appreciated Pulm: Normal work of breathing, clear to auscultation bilaterally with no crackles, wheezes, or rhonchi Neuro: Grossly normal, moves all extremities Psych: Normal affect and thought content       I,Savera Zaman,acting as a scribe for Dimas Chyle, MD.,have documented all relevant documentation on the behalf of Dimas Chyle, MD,as directed by  Dimas Chyle, MD while in the presence of Dimas Chyle, MD.   *** Algis Greenhouse. Jerline Pain, MD 02/13/2021 7:43 AM

## 2021-02-17 ENCOUNTER — Telehealth (INDEPENDENT_AMBULATORY_CARE_PROVIDER_SITE_OTHER): Payer: Medicare Other | Admitting: Physician Assistant

## 2021-02-17 DIAGNOSIS — U071 COVID-19: Secondary | ICD-10-CM | POA: Diagnosis not present

## 2021-02-17 MED ORDER — HYDROCOD POLST-CPM POLST ER 10-8 MG/5ML PO SUER: 5.0000 mL | Freq: Two times a day (BID) | ORAL | 0 refills | Status: DC | PRN

## 2021-02-17 MED ORDER — MOLNUPIRAVIR EUA 200MG CAPSULE: 4.0000 | ORAL_CAPSULE | Freq: Two times a day (BID) | ORAL | 0 refills | Status: AC

## 2021-02-17 NOTE — Progress Notes (Signed)
Virtual Visit via Video Note  I connected with  Rhonda Farley  on 02/17/21 at 11:30 AM EST by a video enabled telemedicine application and verified that I am speaking with the correct person using two identifiers.  Location: Patient: home Provider: Therapist, music at [place] present: Patient and myself.   I discussed the limitations of evaluation and management by telemedicine and the availability of in person appointments. The patient expressed understanding and agreed to proceed.   History of Present Illness:  Chief complaint: COVID-19 positive  Symptom onset: 11/3 - 02/14/21 Pertinent positives: Headache, productive cough, vomiting (initially, now able to keep fluids and some food down), loss of appetite, difficulty sleeping (trazodone not helping), weak, Fever Tmax 101.9 F Pertinent negatives: Dizziness, SOB, chest pain  Treatments tried: Tessalon perles, Delsym, Nyquil nighttime cold & flu; Fioricet, ibuprofen, Mucinex  Vaccine status: UTD COVID-19 vaccines  Sick exposure: Known positive exposure, tested positive 02/13/21 and symptoms worsened on 02/14/21  This is her third time with COVID-19. States the first time she thought she was going to die; this time and last she says "weren't quite as bad" but still does not feel well.    Observations/Objective:  Temp: 101.9 F  SpO2: 98% room air  HR: 90 bpm   Gen: Awake, alert, no acute distress, malaise - pt laying in her bed Resp: Breathing is even and non-labored; HARSH productive paroxysmal cough  Psych: calm/pleasant demeanor Neuro: Alert and Oriented x 3, + facial symmetry, speech is clear.   Assessment and Plan:  1. COVID-19 Diagnosis confirmed via home antigen test. We discussed current algorithm recommendations for prescribing outpatient antivirals.  As the patient is over the age of 70, antivirals are indicated at this time.  Risks versus benefits discussed.  We agreed to start on Molnupiravir  and possible side effects were discussed with her.  Advised self-isolation at home for the next 5 days and then masking around others for at least an additional 5 days.  Treat supportively at this time as well including sleeping prone, deep breathing exercises, pushing fluids, walking every few hours, vitamins C and D, and Tylenol or ibuprofen as needed.  I also sent Tussionex cough syrup for her to help get her some rest especially in the evening time, as nothing else seems to be working for her cough.  She knows not to drive with this medication.  The patient understands that COVID-19 illness can wax and wane.  Should the symptoms acutely worsen or patient starts to experience sudden shortness of breath, chest pain, severe weakness, the patient will go straight to the emergency department.  Also advised home pulse oximetry monitoring and for any reading consistently under 92%, should also report to the emergency department.  The patient will continue to keep [place] updated.   Follow Up Instructions:    I discussed the assessment and treatment plan with the patient. The patient was provided an opportunity to ask questions and all were answered. The patient agreed with the plan and demonstrated an understanding of the instructions.   The patient was advised to call back or seek an in-person evaluation if the symptoms worsen or if the condition fails to improve as anticipated.  This note was prepared with assistance of Systems analyst. Occasional wrong-word or sound-a-like substitutions may have occurred due to the inherent limitations of voice recognition software.   Michayla Mcneil M Ana Woodroof, PA-C

## 2021-02-17 NOTE — Patient Instructions (Signed)
Please take the medications as directed.  Very low threshold for emergency department should your symptoms worsen.  Please send a MyChart message with an update for [place] in the next few days.

## 2021-02-20 ENCOUNTER — Ambulatory Visit: Payer: Medicare Other | Admitting: Family Medicine

## 2021-02-25 ENCOUNTER — Ambulatory Visit: Payer: Medicare Other | Admitting: Family Medicine

## 2021-03-08 DIAGNOSIS — R051 Acute cough: Secondary | ICD-10-CM | POA: Diagnosis not present

## 2021-03-10 ENCOUNTER — Telehealth: Payer: Self-pay

## 2021-03-10 NOTE — Telephone Encounter (Signed)
See note

## 2021-03-10 NOTE — Telephone Encounter (Signed)
Patient was seen at [place] Nurse: Joya Gaskins, RN, Vonna Kotyk Date/Time Eilene Ghazi Time): 03/07/2021 4:11:25 PM Confirm and document reason for call. If symptomatic, describe symptoms.  ---Caller states that she has cough with green sputum and temp around 100.3. Symptoms began yesterday.  Does the patient have any new or worsening symptoms? ---Yes Will a triage be completed? ---Yes Related visit to physician within the last 2 weeks? ---No Does the PT have any chronic conditions? (i.e. diabetes, asthma, this includes High risk factors for pregnancy, etc.) ---Yes List chronic conditions. ---DM II Is this a behavioral health or substance abuse call? ---No  Cough - Acute Productive [1] Fever > 100.0 F (37.8 C)  [2] diabetes mellitus or weak immune system (e.g., HIV positive, cancer chemo, splenectomy, organ transplant, chronic steroids) Joya Gaskins, RN, Vonna Kotyk 03/07/2021 4:12:53 PM  03/07/2021 4:14:12 PM See HCP within 4 Hours (or PCP triage) Yes Joya Gaskins, RN, Vonna Kotyk

## 2021-03-13 ENCOUNTER — Ambulatory Visit (INDEPENDENT_AMBULATORY_CARE_PROVIDER_SITE_OTHER): Payer: Medicare Other | Admitting: Family Medicine

## 2021-03-13 VITALS — BP 98/86 | HR 82 | Temp 98.2°F | Ht 64.5 in | Wt 134.0 lb

## 2021-03-13 DIAGNOSIS — E119 Type 2 diabetes mellitus without complications: Secondary | ICD-10-CM | POA: Diagnosis not present

## 2021-03-13 DIAGNOSIS — J302 Other seasonal allergic rhinitis: Secondary | ICD-10-CM

## 2021-03-13 MED ORDER — AZELASTINE HCL 0.1 % NA SOLN: 2.0000 | Freq: Two times a day (BID) | NASAL | 12 refills | Status: AC

## 2021-03-13 NOTE — Assessment & Plan Note (Signed)
Has had recent elevated and labile blood sugars in the 200s and upper 100s.  Advised her to increase her Ozempic to 0.5 mg weekly.  She initially had some appetite issues but this is now manageable with the Ozempic.  We can recheck A1c in 2 to 3 months.  We will continue her current dose of Toujeo.

## 2021-03-13 NOTE — Patient Instructions (Signed)
It was very nice to see you today!  Your labs are clear.  You have no signs of bronchitis or pneumonia.  Please start the nasal spray.  You can increase your Ozempic to 0.5 mg weekly.  Please come back in a couple of months to recheck your A1c.  Please come back sooner if needed.  Take care, Dr Jerline Pain  PLEASE NOTE:  If you had any lab tests please let us know if you have not heard back within a few days. You may see your results on mychart before we have a chance to review them but we will give you a call once they are reviewed by [place]. If we ordered any referrals today, please let us know if you have not heard from their office within the next week.   Please try these tips to maintain a healthy lifestyle:  Eat at least 3 REAL meals and 1-2 snacks per day.  Aim for no more than 5 hours between eating.  If you eat breakfast, please do so within one hour of getting up.   Each meal should contain half fruits/vegetables, one quarter protein, and one quarter carbs (no bigger than a computer mouse)  Cut down on sweet beverages. This includes juice, soda, and sweet tea.   Drink at least 1 glass of water with each meal and aim for at least 8 glasses per day  Exercise at least 150 minutes every week.

## 2021-03-13 NOTE — Assessment & Plan Note (Signed)
May be contributing to her sinusitis.  We will start Astelin as above.  She will let me know if not improving.

## 2021-03-13 NOTE — Progress Notes (Signed)
   Rhonda Farley is a 70 y.o. female who presents today for an office visit.  Assessment/Plan:  New/Acute Problems: Sinusitis No red flags. She is improving with Z-Pak.  Do not think she needs any more antibiotics at this point.  We will add on Astelin.  She will let me know if symptoms do not improve.   Chronic Problems Addressed Today: Type 2 diabetes mellitus without complication ([place]) Has had recent elevated and labile blood sugars in the 200s and upper 100s.  Advised her to increase her Ozempic to 0.5 mg weekly.  She initially had some appetite issues but this is now manageable with the Ozempic.  We can recheck A1c in 2 to 3 months.  We will continue her current dose of Toujeo.  Seasonal allergies May be contributing to her sinusitis.  We will start Astelin as above.  She will let me know if not improving.     Subjective:  HPI:  She is here to follow up. She went to urgent care for fever and cough with phlegm. Had fever of 100.3 F. She was started on zithromax z-pak.They were concern for bronchitis. She completed Z-Pak with mild relief. She still have fever and cough with phlegm. She usually have fever at night. Associated symptoms are congestion and mild headaches. She notes symptoms seen to be improving over the last couple of days. Denies sinus pressure or ear pain.         Objective:  Physical Exam: BP 98/86   Pulse 82   Temp 98.2 F (36.8 C) (Temporal)   Ht 5' 4.5" (1.638 m)   Wt 134 lb (60.8 kg)   SpO2 97%   BMI 22.65 kg/m   Gen: No acute distress, resting comfortably CV: Regular rate and rhythm with no murmurs appreciated Pulm: Normal work of breathing, clear to auscultation bilaterally with no crackles, wheezes, or rhonchi Neuro: Grossly normal, moves all extremities Psych: Normal affect and thought content       I,Savera Zaman,acting as a scribe for Dimas Chyle, MD.,have documented all relevant documentation on the behalf of Dimas Chyle, MD,as directed  by  Dimas Chyle, MD while in the presence of Dimas Chyle, MD.   I, Dimas Chyle, MD, have reviewed all documentation for this visit. The documentation on 03/13/21 for the exam, diagnosis, procedures, and orders are all accurate and complete.  Algis Greenhouse. Jerline Pain, MD 03/13/2021 1:38 PM

## 2021-03-18 ENCOUNTER — Other Ambulatory Visit: Payer: Self-pay | Admitting: Family Medicine

## 2021-04-17 ENCOUNTER — Other Ambulatory Visit: Payer: Self-pay | Admitting: Family Medicine

## 2021-04-21 DIAGNOSIS — Z79899 Other long term (current) drug therapy: Secondary | ICD-10-CM | POA: Diagnosis not present

## 2021-05-07 DIAGNOSIS — L400 Psoriasis vulgaris: Secondary | ICD-10-CM | POA: Diagnosis not present

## 2021-05-10 DIAGNOSIS — M5416 Radiculopathy, lumbar region: Secondary | ICD-10-CM | POA: Diagnosis not present

## 2021-05-10 DIAGNOSIS — S838X2A Sprain of other specified parts of left knee, initial encounter: Secondary | ICD-10-CM | POA: Diagnosis not present

## 2021-05-10 DIAGNOSIS — M25551 Pain in right hip: Secondary | ICD-10-CM | POA: Diagnosis not present

## 2021-05-23 ENCOUNTER — Other Ambulatory Visit: Payer: Self-pay | Admitting: *Deleted

## 2021-05-23 MED ORDER — OZEMPIC (0.25 OR 0.5 MG/DOSE) 2 MG/1.5ML ~~LOC~~ SOPN: PEN_INJECTOR | SUBCUTANEOUS | 1 refills | Status: DC

## 2021-06-09 ENCOUNTER — Other Ambulatory Visit: Payer: Self-pay | Admitting: Family Medicine

## 2021-06-19 ENCOUNTER — Other Ambulatory Visit: Payer: Self-pay | Admitting: Family Medicine

## 2021-06-25 ENCOUNTER — Telehealth: Payer: Self-pay | Admitting: Family Medicine

## 2021-06-25 ENCOUNTER — Other Ambulatory Visit: Payer: Self-pay | Admitting: *Deleted

## 2021-06-25 MED ORDER — FREESTYLE LIBRE 2 SENSOR MISC: 1 refills | Status: DC

## 2021-06-25 NOTE — Telephone Encounter (Signed)
Refills send  ?

## 2021-06-25 NOTE — Telephone Encounter (Signed)
Patient is calling in regards to getting a refill on her glucose meter.  ?

## 2021-07-08 ENCOUNTER — Other Ambulatory Visit: Payer: Self-pay | Admitting: Family Medicine

## 2021-07-09 ENCOUNTER — Other Ambulatory Visit: Payer: Self-pay | Admitting: *Deleted

## 2021-07-09 MED ORDER — BUTALBITAL-APAP-CAFFEINE 50-325-40 MG PO TABS: 2.0000 | ORAL_TABLET | ORAL | 3 refills | Status: DC | PRN

## 2021-07-12 ENCOUNTER — Other Ambulatory Visit: Payer: Self-pay | Admitting: Family Medicine

## 2021-07-14 NOTE — Telephone Encounter (Signed)
Please ask pt to schedule visit.  ?

## 2021-08-06 ENCOUNTER — Other Ambulatory Visit: Payer: Self-pay | Admitting: Family Medicine

## 2021-08-16 ENCOUNTER — Other Ambulatory Visit: Payer: Self-pay | Admitting: Family Medicine

## 2021-08-16 ENCOUNTER — Encounter: Payer: Self-pay | Admitting: Family Medicine

## 2021-08-16 NOTE — Telephone Encounter (Signed)
Refill was send to pharmacy  ?

## 2021-08-16 NOTE — Telephone Encounter (Signed)
Refills was send to pharmacy  ?

## 2021-08-21 ENCOUNTER — Other Ambulatory Visit: Payer: Self-pay

## 2021-08-21 MED ORDER — SIMVASTATIN 40 MG PO TABS: 40.0000 mg | ORAL_TABLET | ORAL | 2 refills | Status: AC

## 2021-08-31 ENCOUNTER — Other Ambulatory Visit: Payer: Self-pay | Admitting: Family Medicine

## 2021-09-09 ENCOUNTER — Other Ambulatory Visit: Payer: Self-pay | Admitting: Family Medicine

## 2021-09-16 DIAGNOSIS — L400 Psoriasis vulgaris: Secondary | ICD-10-CM | POA: Diagnosis not present

## 2021-09-23 ENCOUNTER — Other Ambulatory Visit: Payer: Self-pay | Admitting: Family Medicine

## 2021-10-09 ENCOUNTER — Other Ambulatory Visit: Payer: Self-pay | Admitting: Family Medicine

## 2021-10-13 DIAGNOSIS — M25551 Pain in right hip: Secondary | ICD-10-CM | POA: Diagnosis not present

## 2021-10-24 ENCOUNTER — Other Ambulatory Visit: Payer: Self-pay | Admitting: Family Medicine

## 2021-10-24 ENCOUNTER — Telehealth: Payer: Self-pay | Admitting: Family Medicine

## 2021-10-24 NOTE — Telephone Encounter (Signed)
  LAST APPOINTMENT DATE:  Please schedule appointment if longer than 1 year-03/13/21  NEXT APPOINTMENT DATE:   MEDICATION: Continuous Blood Gluc Sensor (FREESTYLE LIBRE 2 SENSOR) MISC  Is the patient out of medication? Yes  PHARMACY: [id number] [place], [place] - [address] Phone:  [phone] Fax:  [phone]     Patient leaving town tomorrow (10/25/21)  Let patient know to contact pharmacy at the end of the day to make sure medication is ready.  Please notify patient to allow 48-72 hours to process

## 2021-10-27 ENCOUNTER — Other Ambulatory Visit: Payer: Self-pay | Admitting: *Deleted

## 2021-10-27 NOTE — Telephone Encounter (Signed)
Rx send on 10/24/2021

## 2021-11-04 ENCOUNTER — Telehealth (INDEPENDENT_AMBULATORY_CARE_PROVIDER_SITE_OTHER): Payer: Medicare Other | Admitting: Family Medicine

## 2021-11-04 VITALS — Temp 100.0°F | Ht 64.5 in | Wt 120.0 lb

## 2021-11-04 DIAGNOSIS — J4520 Mild intermittent asthma, uncomplicated: Secondary | ICD-10-CM | POA: Diagnosis not present

## 2021-11-04 DIAGNOSIS — U071 COVID-19: Secondary | ICD-10-CM | POA: Diagnosis not present

## 2021-11-04 DIAGNOSIS — E119 Type 2 diabetes mellitus without complications: Secondary | ICD-10-CM

## 2021-11-04 MED ORDER — GUAIFENESIN-CODEINE 100-10 MG/5ML PO SOLN: 5.0000 mL | Freq: Three times a day (TID) | ORAL | 0 refills | Status: DC | PRN

## 2021-11-04 MED ORDER — MOLNUPIRAVIR 200 MG PO CAPS: 4.0000 | ORAL_CAPSULE | Freq: Two times a day (BID) | ORAL | 0 refills | Status: AC

## 2021-11-04 NOTE — Assessment & Plan Note (Signed)
Typically well controlled on Symbicort as needed.  She has not noticed any worsening symptoms since being having COVID.  We discussed prednisone burst however deferred for now.  She will let us know if she develops any increasing wheezing or shortness of breath.

## 2021-11-04 NOTE — Assessment & Plan Note (Signed)
Increases her risk for complications from COVID.  She is due for A1c check soon.  She is currently on Toujeo 16 units daily and Ozempic 0.5 mg weekly.

## 2021-11-04 NOTE — Progress Notes (Signed)
   Rhonda Farley is a 71 y.o. female who presents today for a virtual office visit.  Assessment/Plan:  New/Acute Problems: COVID 19 No red flags though she is at increased risk for complications due to comorbidities including diabetes and asthma.  We will start molnupiravir.  Also start guaifenesin-codeine for cough.  She can continue Astelin.  She will let us know if not improving.  We discussed reasons to return to care.  Chronic Problems Addressed Today: Asthma Typically well controlled on Symbicort as needed.  She has not noticed any worsening symptoms since being having COVID.  We discussed prednisone burst however deferred for now.  She will let us know if she develops any increasing wheezing or shortness of breath.  Type 2 diabetes mellitus without complication ([name]) Increases her risk for complications from COVID.  She is due for A1c check soon.  She is currently on Toujeo 16 units daily and Ozempic 0.5 mg weekly.     Subjective:  HPI:  Patient here with covid. This is her third time getting covid. Symptoms are consistent with prior infections. She was recently in [place] for a cruise and thinks that she acquired it..  She has had symptoms including fever, headache, nasal congestion, cough, and sore throat.  Test yesterday was positive.  She has not been around any other known sick contacts.  No wheezing.  No shortness of breath.  No chest pain.        Objective/Observations  Physical Exam: Gen: NAD, resting comfortably Pulm: Normal work of breathing Neuro: Grossly normal, moves all extremities Psych: Normal affect and thought content  Virtual Visit via Video   I connected with Rhonda Farley on 11/04/21 at  3:00 PM EDT by a video enabled telemedicine application and verified that I am speaking with the correct person using two identifiers. The limitations of evaluation and management by telemedicine and the availability of in person appointments were discussed. The patient  expressed understanding and agreed to proceed.   Patient location: Home Provider location: [place] participating in the virtual visit: Myself and Patient     Algis Greenhouse. Jerline Pain, MD 11/04/2021 3:22 PM

## 2021-11-12 ENCOUNTER — Other Ambulatory Visit: Payer: Self-pay | Admitting: Family Medicine

## 2022-01-05 ENCOUNTER — Encounter: Payer: Self-pay | Admitting: *Deleted

## 2022-01-07 ENCOUNTER — Other Ambulatory Visit: Payer: Self-pay | Admitting: Family Medicine

## 2022-01-08 ENCOUNTER — Other Ambulatory Visit: Payer: Self-pay | Admitting: *Deleted

## 2022-01-08 MED ORDER — BUTALBITAL-APAP-CAFFEINE 50-325-40 MG PO TABS: 2.0000 | ORAL_TABLET | ORAL | 3 refills | Status: DC | PRN

## 2022-01-26 ENCOUNTER — Ambulatory Visit (INDEPENDENT_AMBULATORY_CARE_PROVIDER_SITE_OTHER): Payer: Medicare Other

## 2022-01-26 DIAGNOSIS — Z0000 Encounter for general adult medical examination without abnormal findings: Secondary | ICD-10-CM

## 2022-01-26 DIAGNOSIS — Z1231 Encounter for screening mammogram for malignant neoplasm of breast: Secondary | ICD-10-CM | POA: Diagnosis not present

## 2022-01-26 NOTE — Patient Instructions (Signed)
Rhonda Farley , Thank you for taking time to come for your Medicare Wellness Visit. I appreciate your ongoing commitment to your health goals. Please review the following plan we discussed and let me know if I can assist you in the future.   These are the goals we discussed:  Goals      Patient Stated     Keep Blood sugar with normal range      Patient Stated     Maintaining good blood sugar         This is a list of the screening recommended for you and due dates:  Health Maintenance  Topic Date Due   Hepatitis C Screening: USPSTF Recommendation to screen - Ages 24-79 yo.  Never done   Zoster (Shingles) Vaccine (1 of 2) Never done   Complete foot exam   07/01/2012   Eye exam for diabetics  10/21/2013   Yearly kidney health urinalysis for diabetes  02/21/2014   Colon Cancer Screening  03/15/2015   Mammogram  05/27/2017   Pneumonia Vaccine (2 - PCV) 05/06/2018   COVID-19 Vaccine (4 - Pfizer risk series) 06/11/2020   Hemoglobin A1C  05/14/2021   Yearly kidney function blood test for diabetes  10/24/2021   Flu Shot  11/11/2021   Tetanus Vaccine  05/07/2027   DEXA scan (bone density measurement)  Completed   HPV Vaccine  Aged Out    Advanced directives: Please bring a copy of your health care power of attorney and living will to the office at your convenience.  Conditions/risks identified: Maintain good blood sugar  Next appointment: Follow up in one year for your annual wellness visit    Preventive Care 65 Years and Older, Female Preventive care refers to lifestyle choices and visits with your health care provider that can promote health and wellness. What does preventive care include? A yearly physical exam. This is also called an annual well check. Dental exams once or twice a year. Routine eye exams. Ask your health care provider how often you should have your eyes checked. Personal lifestyle choices, including: Daily care of your teeth and gums. Regular physical  activity. Eating a healthy diet. Avoiding tobacco and drug use. Limiting alcohol use. Practicing safe sex. Taking low-dose aspirin every day. Taking vitamin and mineral supplements as recommended by your health care provider. What happens during an annual well check? The services and screenings done by your health care provider during your annual well check will depend on your age, overall health, lifestyle risk factors, and family history of disease. Counseling  Your health care provider may ask you questions about your: Alcohol use. Tobacco use. Drug use. Emotional well-being. Home and relationship well-being. Sexual activity. Eating habits. History of falls. Memory and ability to understand (cognition). Work and work Statistician. Reproductive health. Screening  You may have the following tests or measurements: Height, weight, and BMI. Blood pressure. Lipid and cholesterol levels. These may be checked every 5 years, or more frequently if you are over 62 years old. Skin check. Lung cancer screening. You may have this screening every year starting at age 43 if you have a 30-pack-year history of smoking and currently smoke or have quit within the past 15 years. Fecal occult blood test (FOBT) of the stool. You may have this test every year starting at age 36. Flexible sigmoidoscopy or colonoscopy. You may have a sigmoidoscopy every 5 years or a colonoscopy every 10 years starting at age 84. Hepatitis C blood test. Hepatitis B  blood test. Sexually transmitted disease (STD) testing. Diabetes screening. This is done by checking your blood sugar (glucose) after you have not eaten for a while (fasting). You may have this done every 1-3 years. Bone density scan. This is done to screen for osteoporosis. You may have this done starting at age 43. Mammogram. This may be done every 1-2 years. Talk to your health care provider about how often you should have regular mammograms. Talk with your  health care provider about your test results, treatment options, and if necessary, the need for more tests. Vaccines  Your health care provider may recommend certain vaccines, such as: Influenza vaccine. This is recommended every year. Tetanus, diphtheria, and acellular pertussis (Tdap, Td) vaccine. You may need a Td booster every 10 years. Zoster vaccine. You may need this after age 36. Pneumococcal 13-valent conjugate (PCV13) vaccine. One dose is recommended after age 74. Pneumococcal polysaccharide (PPSV23) vaccine. One dose is recommended after age 54. Talk to your health care provider about which screenings and vaccines you need and how often you need them. This information is not intended to replace advice given to you by your health care provider. Make sure you discuss any questions you have with your health care provider. Document Released: 04/26/2015 Document Revised: 12/18/2015 Document Reviewed: 01/29/2015 Elsevier Interactive Patient Education  2017 [place] Prevention in the Home Falls can cause injuries. They can happen to people of all ages. There are many things you can do to make your home safe and to help prevent falls. What can I do on the outside of my home? Regularly fix the edges of walkways and driveways and fix any cracks. Remove anything that might make you trip as you walk through a door, such as a raised step or threshold. Trim any bushes or trees on the path to your home. Use bright outdoor lighting. Clear any walking paths of anything that might make someone trip, such as rocks or tools. Regularly check to see if handrails are loose or broken. Make sure that both sides of any steps have handrails. Any raised decks and porches should have guardrails on the edges. Have any leaves, snow, or ice cleared regularly. Use sand or salt on walking paths during winter. Clean up any spills in your garage right away. This includes oil or grease spills. What can I  do in the bathroom? Use night lights. Install grab bars by the toilet and in the tub and shower. Do not use towel bars as grab bars. Use non-skid mats or decals in the tub or shower. If you need to sit down in the shower, use a plastic, non-slip stool. Keep the floor dry. Clean up any water that spills on the floor as soon as it happens. Remove soap buildup in the tub or shower regularly. Attach bath mats securely with double-sided non-slip rug tape. Do not have throw rugs and other things on the floor that can make you trip. What can I do in the bedroom? Use night lights. Make sure that you have a light by your bed that is easy to reach. Do not use any sheets or blankets that are too big for your bed. They should not hang down onto the floor. Have a firm chair that has side arms. You can use this for support while you get dressed. Do not have throw rugs and other things on the floor that can make you trip. What can I do in the kitchen? Clean up any spills  right away. Avoid walking on wet floors. Keep items that you use a lot in easy-to-reach places. If you need to reach something above you, use a strong step stool that has a grab bar. Keep electrical cords out of the way. Do not use floor polish or wax that makes floors slippery. If you must use wax, use non-skid floor wax. Do not have throw rugs and other things on the floor that can make you trip. What can I do with my stairs? Do not leave any items on the stairs. Make sure that there are handrails on both sides of the stairs and use them. Fix handrails that are broken or loose. Make sure that handrails are as long as the stairways. Check any carpeting to make sure that it is firmly attached to the stairs. Fix any carpet that is loose or worn. Avoid having throw rugs at the top or bottom of the stairs. If you do have throw rugs, attach them to the floor with carpet tape. Make sure that you have a light switch at the top of the stairs  and the bottom of the stairs. If you do not have them, ask someone to add them for you. What else can I do to help prevent falls? Wear shoes that: Do not have high heels. Have rubber bottoms. Are comfortable and fit you well. Are closed at the toe. Do not wear sandals. If you use a stepladder: Make sure that it is fully opened. Do not climb a closed stepladder. Make sure that both sides of the stepladder are locked into place. Ask someone to hold it for you, if possible. Clearly mark and make sure that you can see: Any grab bars or handrails. First and last steps. Where the edge of each step is. Use tools that help you move around (mobility aids) if they are needed. These include: Canes. Walkers. Scooters. Crutches. Turn on the lights when you go into a dark area. Replace any light bulbs as soon as they burn out. Set up your furniture so you have a clear path. Avoid moving your furniture around. If any of your floors are uneven, fix them. If there are any pets around you, be aware of where they are. Review your medicines with your doctor. Some medicines can make you feel dizzy. This can increase your chance of falling. Ask your doctor what other things that you can do to help prevent falls. This information is not intended to replace advice given to you by your health care provider. Make sure you discuss any questions you have with your health care provider. Document Released: 01/24/2009 Document Revised: 09/05/2015 Document Reviewed: 05/04/2014 Elsevier Interactive Patient Education  2017 [employer].

## 2022-01-26 NOTE — Progress Notes (Signed)
I connected with  Rhonda Farley on 01/26/22 by a audio enabled telemedicine application and verified that I am speaking with the correct person using two identifiers.  Patient Location: Home  Provider Location: Office/Clinic  I discussed the limitations of evaluation and management by telemedicine. The patient expressed understanding and agreed to proceed.   Subjective:   Rhonda Farley is a 71 y.o. female who presents for Medicare Annual (Subsequent) preventive examination.  Review of Systems     Cardiac Risk Factors include: advanced age (>62mn, >>43women);diabetes mellitus;dyslipidemia     Objective:    There were no vitals filed for this visit. There is no height or weight on file to calculate BMI.     01/26/2022   11:42 AM 01/13/2021   11:26 AM  Advanced Directives  Does Patient Have a Medical Advance Directive? Yes Yes  Type of AParamedicof [place]penaLiving will HMorseLiving will  Copy of HRodmanin Chart? No - copy requested No - copy requested    Current Medications (verified) Outpatient Encounter Medications as of 01/26/2022  Medication Sig   azelastine (ASTELIN) 0.1 % nasal spray Place 2 sprays into both nostrils 2 (two) times daily.   B Complex-C (SUPER B COMPLEX PO) Take by mouth daily.   budesonide-formoterol (SYMBICORT) 80-4.5 MCG/ACT inhaler Inhale 2 puffs into the lungs as needed.   buPROPion (WELLBUTRIN XL) 150 MG 24 hr tablet TAKE 1 TABLET BY MOUTH EVERY DAY IN THE MORNING   butalbital-acetaminophen-caffeine (FIORICET) 50-325-40 MG tablet Take 2 tablets by mouth as needed.   CALCIUM ACETATE-MAGNESIUM CARB PO Take by mouth. Vit d 3   cetirizine (ZYRTEC) 10 MG tablet Take 10 mg by mouth daily.   Continuous Blood Gluc Receiver (FREESTYLE LIBRE READER) DEVI Use up to 4 times daily to check blood sugar.   Continuous Blood Gluc Sensor (FREESTYLE LIBRE 2 SENSOR) MISC USE AS DIRECTED E11.9    cyanocobalamin (,VITAMIN B-12,) 1000 MCG/ML injection INJECT 1 ML (1,000 MCG TOTAL) INTO THE MUSCLE EVERY 30 (THIRTY) DAYS. PT MAY SELF ADMINSTER   cyclobenzaprine (FLEXERIL) 10 MG tablet Take by mouth.   diclofenac sodium (VOLTAREN) 1 % GEL Apply 2 g topically 4 (four) times daily.   DULoxetine (CYMBALTA) 60 MG capsule TAKE 1 CAPSULE BY MOUTH EVERY DAY   Insulin Pen Needle 32G X 6 MM MISC 1 each by Does not apply route daily.   Multiple Vitamin (MULTIVITAMIN) capsule Take 1 capsule by mouth daily.   Olopatadine HCl 0.2 % SOLN Apply to eye as needed.   ondansetron (ZOFRAN ODT) 4 MG disintegrating tablet Take 1 tablet (4 mg total) by mouth every 8 (eight) hours as needed for nausea or vomiting.   OZEMPIC, 0.25 OR 0.5 MG/DOSE, 2 MG/3ML SOPN INJECT 0.5 MG INTO THE SKIN ONCE A WEEK.   simvastatin (ZOCOR) 40 MG tablet Take 1 tablet (40 mg total) by mouth every other day.   TOUJEO MAX SOLOSTAR 300 UNIT/ML Solostar Pen INJECT 16 UNITS INTO THE SKIN DAILY.   traZODone (DESYREL) 100 MG tablet TAKE 1 TABLET EVERY NIGHT AT BEDTIME - GENERIC FOR DESYREL   Triamcinolone Acetonide (TRIAMCINOLONE 0.1 % CREAM : EUCERIN) CREA Apply 1 application topically 2 (two) times daily.   valACYclovir (VALTREX) 500 MG tablet Take 1 tablet (500 mg total) by mouth 2 (two) times daily.   guaiFENesin-codeine 100-10 MG/5ML syrup Take 5 mLs by mouth 3 (three) times daily as needed for cough. (Patient not taking:  Reported on 01/26/2022)   [DISCONTINUED] gabapentin (NEURONTIN) 100 MG capsule Take 1 capsule (100 mg total) by mouth at bedtime.   No facility-administered encounter medications on file as of 01/26/2022.    Allergies (verified) Citrullus vulgaris   History: Past Medical History:  Diagnosis Date   Allergy    Arthritis    Asthma    Cancer ([place])    sebaceous cancer dx. 2008   Cancer of sebaceous glands    Chronic headaches    Depression    Diabetes mellitus    Fibromyalgia    GERD (gastroesophageal reflux  disease)    Hyperlipidemia    Hypertension    no per patient   Migraines    Obesity    Pneumonia    Shingles    Tachycardia    Thyroid disease    Past Surgical History:  Procedure Laterality Date   ABDOMINAL EXPLORATION SURGERY     BUNIONECTOMY     CERVICAL LAMINECTOMY  2000   ROTATOR CUFF REPAIR  01/2010   right   ROUX-EN-Y PROCEDURE  2009   TONSILLECTOMY     Family History  Problem Relation Age of Onset   Arthritis Mother    Cancer Mother        colon/skin   Colon cancer Mother    Cancer Father        lung/prostate   Diabetes Paternal Uncle    Colon cancer Paternal Uncle    Arthritis Paternal Grandmother    Heart disease Paternal Grandmother    Arthritis Paternal Grandfather    Cancer Paternal Grandfather        prostate   Esophageal cancer Maternal Aunt    Social History   Socioeconomic History   Marital status: Widowed    Spouse name: Not on file   Number of children: Not on file   Years of education: Not on file   Highest education level: Not on file  Occupational History   Not on file  Tobacco Use   Smoking status: Former    Types: Cigarettes    Quit date: 04/13/1977    Years since quitting: 44.8   Smokeless tobacco: Never  Vaping Use   Vaping Use: Never used  Substance and Sexual Activity   Alcohol use: No   Drug use: No   Sexual activity: Not on file  Other Topics Concern   Not on file  Social History Narrative   Not on file   Social Determinants of Health   Financial Resource Strain: Low Risk  (01/13/2021)   Overall Financial Resource Strain (CARDIA)    Difficulty of Paying Living Expenses: Not hard at all  Food Insecurity: No Food Insecurity (01/13/2021)   Hunger Vital Sign    Worried About Running Out of Food in the Last Year: Never true    Hope in the Last Year: Never true  Transportation Needs: No Transportation Needs (01/13/2021)   PRAPARE - Hydrologist (Medical): No    Lack of Transportation  (Non-Medical): No  Physical Activity: Sufficiently Active (01/26/2022)   Exercise Vital Sign    Days of Exercise per Week: 5 days    Minutes of Exercise per Session: 30 min  Stress: No Stress Concern Present (01/26/2022)   [place]    Feeling of Stress : Not at all  Social Connections: Moderately Isolated (01/26/2022)   Social Connection and Isolation Panel [NHANES]    Frequency  of Communication with Friends and Family: More than three times a week    Frequency of Social Gatherings with Friends and Family: More than three times a week    Attends Religious Services: More than 4 times per year    Active Member of [employer] or Organizations: No    Attends Archivist Meetings: Never    Marital Status: Widowed    Tobacco Counseling Counseling given: Not Answered   Clinical Intake:  Pre-visit preparation completed: Yes  Pain : No/denies pain     Nutritional Risks: None Diabetes: Yes CBG done?: Yes (112 PER PT) CBG resulted in Enter/ Edit results?: No Did pt. bring in CBG monitor from home?: No  How often do you need to have someone help you when you read instructions, pamphlets, or other written materials from your doctor or pharmacy?: 1 - Never  Diabetic?Nutrition Risk Assessment:  Has the patient had any N/V/D within the last 2 months?  No  Does the patient have any non-healing wounds?  No  Has the patient had any unintentional weight loss or weight gain?  No   Diabetes:  Is the patient diabetic?  Yes  If diabetic, was a CBG obtained today?  Yes  Did the patient bring in their glucometer from home?  No  How often do you monitor your CBG's? Daily .   Financial Strains and Diabetes Management:  Are you having any financial strains with the device, your supplies or your medication? No .  Does the patient want to be seen by Chronic Care Management for management of their diabetes?  No  Would the  patient like to be referred to a Nutritionist or for Diabetic Management?  No   Diabetic Exams:  Diabetic Eye Exam: Overdue for diabetic eye exam. Pt has been advised about the importance in completing this exam. Patient advised to call and schedule an eye exam. Pt stated she will make appt  Diabetic Foot Exam: Overdue, Pt has been advised about the importance in completing this exam. Pt is scheduled for diabetic foot exam on next appt .   Interpreter Needed?: No  Information entered by :: Charlott Rakes, LPN   Activities of Daily Living    01/26/2022   11:43 AM  In your present state of health, do you have any difficulty performing the following activities:  Hearing? 0  Vision? 0  Difficulty concentrating or making decisions? 0  Walking or climbing stairs? 0  Dressing or bathing? 0  Doing errands, shopping? 0  Preparing Food and eating ? N  Using the Toilet? N  In the past six months, have you accidently leaked urine? N  Do you have problems with loss of bowel control? N  Managing your Medications? N  Managing your Finances? N  Housekeeping or managing your Housekeeping? N    Patient Care Team: Vivi Barrack, MD as PCP - General (Family Medicine)  Indicate any recent Medical Services you may have received from other than [facility] providers in the past year (date may be approximate).     Assessment:   This is a routine wellness examination for [facility].  Hearing/Vision screen Hearing Screening - Comments:: Pt stated slight loss  Vision Screening - Comments:: Pt follows up with Dr Patrice Paradise for annual eye exams   Dietary issues and exercise activities discussed: Current Exercise Habits: Home exercise routine, Type of exercise: walking, Time (Minutes): 30, Frequency (Times/Week): 5, Weekly Exercise (Minutes/Week): 150   Goals Addressed  This Visit's Progress    Patient Stated       Maintaining good blood sugar        Depression Screen    01/26/2022    11:40 AM 03/13/2021    1:22 PM 01/13/2021   11:25 AM 11/11/2020   12:55 PM 10/30/2020   10:11 AM 07/11/2020    8:47 AM  PHQ 2/9 Scores  PHQ - 2 Score 0 0 0 0 0 2  PHQ- 9 Score  9   0 8    Fall Risk    01/26/2022   11:43 AM 03/13/2021    1:23 PM 01/13/2021   11:26 AM 11/11/2020   12:56 PM 10/30/2020   10:11 AM  Fall Risk   Falls in the past year? '1 1 1 '$ 0 0  Number falls in past yr: '1 1 1 '$ 0 0  Injury with Fall? 0 0 1 0 0  Comment   broke foot    Risk for fall due to : Impaired vision  Impaired vision No Fall Risks No Fall Risks  Follow up Falls prevention discussed  Falls prevention discussed      FALL RISK PREVENTION PERTAINING TO THE HOME:  Any stairs in or around the home? Yes  If so, are there any without handrails? No  Home free of loose throw rugs in walkways, pet beds, electrical cords, etc? Yes  Adequate lighting in your home to reduce risk of falls? Yes   ASSISTIVE DEVICES UTILIZED TO PREVENT FALLS:  Life alert? No  Use of a cane, walker or w/c? No  Grab bars in the bathroom? Yes  Shower chair or bench in shower? Yes  Elevated toilet seat or a handicapped toilet? No   TIMED UP AND GO:  Was the test performed? No .   Cognitive Function:        01/26/2022   11:43 AM 01/13/2021   11:28 AM  6CIT Screen  What Year? 0 points 0 points  What month? 0 points 0 points  What time? 0 points 0 points  Count back from 20 0 points 0 points  Months in reverse 0 points 0 points  Repeat phrase 0 points 0 points  Total Score 0 points 0 points    Immunizations Immunization History  Administered Date(s) Administered   Influenza, High Dose Seasonal PF 01/31/2018   Influenza, Seasonal, Injecte, Preservative Fre 03/18/2012   Influenza,inj,Quad PF,6+ Mos 01/10/2013, 04/08/2016, 05/06/2017   Influenza-Unspecified 12/12/2020   PFIZER(Purple Top)SARS-COV-2 Vaccination 06/11/2019, 07/11/2019, 04/16/2020   Pneumococcal Polysaccharide-23 04/13/2002, 05/06/2017   Td 04/13/1994    Tdap 01/10/2013, 05/06/2017    TDAP status: Up to date  Flu Vaccine status: Due, Education has been provided regarding the importance of this vaccine. Advised may receive this vaccine at local pharmacy or Health Dept. Aware to provide a copy of the vaccination record if obtained from local pharmacy or Health Dept. Verbalized acceptance and understanding.  Pneumococcal vaccine status: Due, Education has been provided regarding the importance of this vaccine. Advised may receive this vaccine at local pharmacy or Health Dept. Aware to provide a copy of the vaccination record if obtained from local pharmacy or Health Dept. Verbalized acceptance and understanding.  Covid-19 vaccine status: Completed vaccines  Qualifies for Shingles Vaccine? Yes   Zostavax completed No   Shingrix Completed?: No.    Education has been provided regarding the importance of this vaccine. Patient has been advised to call insurance company to determine out of pocket expense if they have  not yet received this vaccine. Advised may also receive vaccine at local pharmacy or Health Dept. Verbalized acceptance and understanding.  Screening Tests Health Maintenance  Topic Date Due   Hepatitis C Screening  Never done   Zoster Vaccines- Shingrix (1 of 2) Never done   FOOT EXAM  07/01/2012   OPHTHALMOLOGY EXAM  10/21/2013   Diabetic kidney evaluation - Urine ACR  02/21/2014   COLONOSCOPY (Pts 45-11yr Insurance coverage will need to be confirmed)  03/15/2015   MAMMOGRAM  05/27/2017   Pneumonia Vaccine 69 Years old (2 - PCV) 05/06/2018   COVID-19 Vaccine (4 - Pfizer risk series) 06/11/2020   HEMOGLOBIN A1C  05/14/2021   Diabetic kidney evaluation - GFR measurement  10/24/2021   INFLUENZA VACCINE  11/11/2021   TETANUS/TDAP  05/07/2027   DEXA SCAN  Completed   HPV VACCINES  Aged Out    Health Maintenance  Health Maintenance Due  Topic Date Due   Hepatitis C Screening  Never done   Zoster Vaccines- Shingrix (1 of 2)  Never done   FOOT EXAM  07/01/2012   OPHTHALMOLOGY EXAM  10/21/2013   Diabetic kidney evaluation - Urine ACR  02/21/2014   COLONOSCOPY (Pts 45-414yrInsurance coverage will need to be confirmed)  03/15/2015   MAMMOGRAM  05/27/2017   Pneumonia Vaccine 6566Years old (2 - PCV) 05/06/2018   COVID-19 Vaccine (4 - Pfizer risk series) 06/11/2020   HEMOGLOBIN A1C  05/14/2021   Diabetic kidney evaluation - GFR measurement  10/24/2021   INFLUENZA VACCINE  11/11/2021    Colorectal cancer screening: Type of screening: Colonoscopy. Completed 03/14/13. Repeat every 2 years, pt will make  appt with Dr MeEarlean Shawl Mammogram status: Ordered 01/26/22. Pt provided with contact info and advised to call to schedule appt.   Bone Density status: Completed 09/24/09. Results reflect: Bone density results: NORMAL. Repeat every as directed  years.   Additional Screening:  Hepatitis C Screening: does qualify;   Vision Screening: Recommended annual ophthalmology exams for early detection of glaucoma and other disorders of the eye. Is the patient up to date with their annual eye exam?  no Who is the provider or what is the name of the office in which the patient attends annual eye exams? Dr CoPatrice ParadiseIf pt is not established with a provider, would they like to be referred to a provider to establish care? No .   Dental Screening: Recommended annual dental exams for proper oral hygiene  Community Resource Referral / Chronic Care Management: CRR required this visit?  No   CCM required this visit?  No      Plan:     I have personally reviewed and noted the following in the patient's chart:   Medical and social history Use of alcohol, tobacco or illicit drugs  Current medications and supplements including opioid prescriptions. Patient is not currently taking opioid prescriptions. Functional ability and status Nutritional status Physical activity Advanced directives List of other physicians Hospitalizations,  surgeries, and ER visits in previous 12 months Vitals Screenings to include cognitive, depression, and falls Referrals and appointments  In addition, I have reviewed and discussed with patient certain preventive protocols, quality metrics, and best practice recommendations. A written personalized care plan for preventive services as well as general preventive health recommendations were provided to patient.     TiWillette BraceLPN   [id number] Nurse Notes: none

## 2022-01-26 NOTE — Progress Notes (Signed)
I have personally reviewed the Medicare Annual Wellness Visit and agree with the documentation.  Rhonda Farley. Jerline Pain, MD 01/26/2022 12:54 PM

## 2022-02-03 ENCOUNTER — Ambulatory Visit (HOSPITAL_BASED_OUTPATIENT_CLINIC_OR_DEPARTMENT_OTHER)
Admission: RE | Admit: 2022-02-03 | Discharge: 2022-02-03 | Disposition: A | Discharge status: Home / Self Care | Payer: Medicare Other | Source: Ambulatory Visit | Attending: Family Medicine | Admitting: Family Medicine

## 2022-02-03 DIAGNOSIS — Z1231 Encounter for screening mammogram for malignant neoplasm of breast: Secondary | ICD-10-CM | POA: Diagnosis not present

## 2022-02-05 ENCOUNTER — Other Ambulatory Visit: Payer: Self-pay | Admitting: Family Medicine

## 2022-02-17 DIAGNOSIS — M25551 Pain in right hip: Secondary | ICD-10-CM | POA: Diagnosis not present

## 2022-03-03 ENCOUNTER — Telehealth: Payer: Self-pay | Admitting: Family Medicine

## 2022-03-03 ENCOUNTER — Other Ambulatory Visit: Payer: Self-pay | Admitting: *Deleted

## 2022-03-03 ENCOUNTER — Other Ambulatory Visit: Payer: Self-pay | Admitting: Family Medicine

## 2022-03-03 DIAGNOSIS — Z23 Encounter for immunization: Secondary | ICD-10-CM | POA: Diagnosis not present

## 2022-03-03 MED ORDER — FREESTYLE LIBRE 2 SENSOR MISC: 1 refills | Status: DC

## 2022-03-03 NOTE — Telephone Encounter (Signed)
   LAST APPOINTMENT DATE:  11/04/21  NEXT APPOINTMENT DATE: 03/17/22  MEDICATION: Continuous Blood Gluc Sensor (FREESTYLE LIBRE 2 SENSOR) MISC   Is the patient out of medication? Yes  PHARMACY: [id number] [place], [place] - [id number]: [phone] Fax: [phone]

## 2022-03-03 NOTE — Telephone Encounter (Signed)
[facility]  

## 2022-03-04 MED ORDER — FREESTYLE LIBRE READER DEVI: 0 refills | Status: AC

## 2022-03-17 ENCOUNTER — Other Ambulatory Visit: Payer: Self-pay | Admitting: Family Medicine

## 2022-03-17 ENCOUNTER — Ambulatory Visit (INDEPENDENT_AMBULATORY_CARE_PROVIDER_SITE_OTHER): Payer: Medicare Other | Admitting: Family Medicine

## 2022-03-17 ENCOUNTER — Encounter: Payer: Self-pay | Admitting: Family Medicine

## 2022-03-17 ENCOUNTER — Other Ambulatory Visit: Payer: Medicare Other

## 2022-03-17 VITALS — BP 132/88 | HR 95 | Temp 97.5°F | Resp 16 | Ht 63.5 in | Wt 132.0 lb

## 2022-03-17 DIAGNOSIS — R3589 Other polyuria: Secondary | ICD-10-CM

## 2022-03-17 DIAGNOSIS — J4520 Mild intermittent asthma, uncomplicated: Secondary | ICD-10-CM | POA: Diagnosis not present

## 2022-03-17 DIAGNOSIS — E785 Hyperlipidemia, unspecified: Secondary | ICD-10-CM | POA: Diagnosis not present

## 2022-03-17 DIAGNOSIS — F419 Anxiety disorder, unspecified: Secondary | ICD-10-CM

## 2022-03-17 DIAGNOSIS — F325 Major depressive disorder, single episode, in full remission: Secondary | ICD-10-CM | POA: Diagnosis not present

## 2022-03-17 DIAGNOSIS — E1149 Type 2 diabetes mellitus with other diabetic neurological complication: Secondary | ICD-10-CM | POA: Diagnosis not present

## 2022-03-17 DIAGNOSIS — G4762 Sleep related leg cramps: Secondary | ICD-10-CM | POA: Diagnosis not present

## 2022-03-17 DIAGNOSIS — E538 Deficiency of other specified B group vitamins: Secondary | ICD-10-CM | POA: Diagnosis not present

## 2022-03-17 DIAGNOSIS — E119 Type 2 diabetes mellitus without complications: Secondary | ICD-10-CM

## 2022-03-17 DIAGNOSIS — E1169 Type 2 diabetes mellitus with other specified complication: Secondary | ICD-10-CM | POA: Diagnosis not present

## 2022-03-17 DIAGNOSIS — E559 Vitamin D deficiency, unspecified: Secondary | ICD-10-CM

## 2022-03-17 DIAGNOSIS — Z79899 Other long term (current) drug therapy: Secondary | ICD-10-CM | POA: Diagnosis not present

## 2022-03-17 DIAGNOSIS — R61 Generalized hyperhidrosis: Secondary | ICD-10-CM

## 2022-03-17 DIAGNOSIS — L409 Psoriasis, unspecified: Secondary | ICD-10-CM

## 2022-03-17 LAB — URINALYSIS, ROUTINE W REFLEX MICROSCOPIC
Bilirubin Urine: NEGATIVE
Hgb urine dipstick: NEGATIVE
Leukocytes,Ua: NEGATIVE
Nitrite: NEGATIVE
RBC / HPF: NONE SEEN (ref 0–?)
Specific Gravity, Urine: >=1.03 — AB (ref 1.000–1.030)
Urine Glucose: 100 — AB
Urobilinogen, UA: 1 (ref 0.0–1.0)
pH: 5.5 (ref 5.0–8.0)

## 2022-03-17 LAB — MICROALBUMIN / CREATININE URINE RATIO
Creatinine,U: 161 mg/dL
Microalb Creat Ratio: 2.9 mg/g (ref 0.0–30.0)
Microalb, Ur: 4.6 mg/dL — ABNORMAL HIGH (ref 0.0–1.9)

## 2022-03-17 MED ORDER — FREESTYLE LIBRE 2 SENSOR MISC: 3 refills | Status: DC

## 2022-03-17 MED ORDER — INSULIN LISPRO (1 UNIT DIAL) 100 UNIT/ML (KWIKPEN): PEN_INJECTOR | SUBCUTANEOUS | 11 refills | Status: DC

## 2022-03-17 MED ORDER — AMOXICILLIN-POT CLAVULANATE 875-125 MG PO TABS: 1.0000 | ORAL_TABLET | Freq: Two times a day (BID) | ORAL | 0 refills | Status: DC

## 2022-03-17 MED ORDER — TRAZODONE HCL 100 MG PO TABS: ORAL_TABLET | ORAL | 0 refills | Status: DC

## 2022-03-17 NOTE — Patient Instructions (Signed)
It was very nice to see you today!  We will check blood work today.  Please make sure that you are getting plenty of fluids and staying well-hydrated.  You may have a vitamin deficiency that is causing your cramping.  We will also check blood work to look for your night sweats.  We will check for any vitamin deficiencies or electrolyte issues.  We will also look for any other signs of infection.  Please let us know if you have any change in symptoms.  Your body will probably clear your sinus infection.  Please start the antibiotic if not improving in the next few days.  We will send a new prescription for your trazodone.  I will send in a prescription for short acting insulin to use as needed for sugars greater than 200.  We will see you back in 6 months.  Please come back to see [place] sooner if needed.    Take care, Dr Jerline Pain  PLEASE NOTE:  If you had any lab tests please let us know if you have not heard back within a few days. You may see your results on mychart before we have a chance to review them but we will give you a call once they are reviewed by [place]. If we ordered any referrals today, please let us know if you have not heard from their office within the next week.   Please try these tips to maintain a healthy lifestyle:  Eat at least 3 REAL meals and 1-2 snacks per day.  Aim for no more than 5 hours between eating.  If you eat breakfast, please do so within one hour of getting up.   Each meal should contain half fruits/vegetables, one quarter protein, and one quarter carbs (no bigger than a computer mouse)  Cut down on sweet beverages. This includes juice, soda, and sweet tea.   Drink at least 1 glass of water with each meal and aim for at least 8 glasses per day  Exercise at least 150 minutes every week.

## 2022-03-17 NOTE — Assessment & Plan Note (Signed)
Uses Symbicort at needed seasonally.  Has not had any recent flares.  Does not need refill on medications today.

## 2022-03-17 NOTE — Addendum Note (Signed)
Addended by: Loura Back on: 03/17/2022 03:17 PM   Modules accepted: Orders

## 2022-03-17 NOTE — Assessment & Plan Note (Signed)
She is self administering B12 injections.  We will check B12 level today.

## 2022-03-17 NOTE — Assessment & Plan Note (Signed)
Check lipids.  She is on simvastatin '40mg'$  every other day.

## 2022-03-17 NOTE — Assessment & Plan Note (Signed)
Mood is stable on Cymbalta 60 mg daily and Wellbutrin 150 mg daily.

## 2022-03-17 NOTE — Assessment & Plan Note (Signed)
Follows with dermatology.  Currently on Skyrizi.  Seems to be working well.

## 2022-03-17 NOTE — Assessment & Plan Note (Signed)
Stable on Cymbalta 60 mg daily.

## 2022-03-17 NOTE — Addendum Note (Signed)
Addended by: Loura Back on: 03/17/2022 03:16 PM   Modules accepted: Orders

## 2022-03-17 NOTE — Progress Notes (Signed)
   Rhonda Farley is a 71 y.o. female who presents today for an office visit.  Assessment/Plan:  New/Acute Problems: Nocturnal Leg Cramp Check labs today including CBC, c-Met, B1, B6, B12.  Also check UA and urinalysis with urine culture.  Encouraged hydration.  She will continue home stretching.  We will consider empiric trial of vitamin supplementation depending on results of labs  Night sweats No other red flag signs or symptoms.  Afebrile today and has not had any unintentional weight loss.  We will check labs today including CBC, c-Met, TSH, CRP, sed rate, QuantiFERON, and urine culture.  Depending on results may need imaging for further evaluation.  May potentially be a side effect of medication as well.  Chronic Problems Addressed Today: Type 2 diabetes mellitus without complication ([name]) Check A1c.  She is currently on Ozempic 0.5 mg weekly and Toujeo 16 units daily.  B12 deficiency s/p gastric bypass She is self administering B12 injections.  We will check B12 level today.  Dyslipidemia associated with type 2 diabetes mellitus ([name]) Check lipids.  She is on simvastatin 30m every other day.  Depression, major, single episode, complete remission ([name]) Mood is stable on Cymbalta 60 mg daily and Wellbutrin 150 mg daily.  Anxiety Stable on Cymbalta 60 mg daily.  Asthma Uses Symbicort at needed seasonally.  Has not had any recent flares.  Does not need refill on medications today.  Psoriasis Follows with dermatology.  Currently on Skyrizi.  Seems to be working well.  Preventative health care Check labs.  Will be getting colonoscopy next month.  Up-to-date on Pap and mammogram per GYN.  She will be getting her shingles vaccine soon.     Subjective:  HPI:  See A/p for status of chronic conditions.  She has been having issues with cramping in her calves and hands. She has tried stretching and topical magnesium.  This has been going on for months.  Happens very frequently.   No other treatments tried.  No injuries.  No other precipitating events.  She has also been having issues with nightsweats. This has been going on for about 8 months. Happens most nights of the week. Soaks through sheet. No fevers. Some chills. No appetite changes. No weight loss. Her sugars have been normal at night.    She has had some sinus congestion for the last week or so.  No fevers.  A lot of facial pressure.  She has also had some nasal discharge.        Objective:  Physical Exam: BP 132/88   Pulse 95   Temp (!) 97.5 F (36.4 C) (Temporal)   Resp 16   Ht 5' 3.5" (1.613 m)   Wt 132 lb (59.9 kg)   SpO2 99%   BMI 23.02 kg/m   Wt Readings from Last 3 Encounters:  03/17/22 132 lb (59.9 kg)  11/04/21 120 lb (54.4 kg)  03/13/21 134 lb (60.8 kg)    Gen: No acute distress, resting comfortably HEENT: TMs with clear effusion.  OP erythematous. CV: Regular rate and rhythm with no murmurs appreciated Pulm: Normal work of breathing, clear to auscultation bilaterally with no crackles, wheezes, or rhonchi Neuro: Grossly normal, moves all extremities Psych: Normal affect and thought content      [name] M. PJerline Pain MD 03/17/2022 2:11 PM

## 2022-03-17 NOTE — Assessment & Plan Note (Signed)
Check A1c.  She is currently on Ozempic 0.5 mg weekly and Toujeo 16 units daily.

## 2022-03-18 ENCOUNTER — Telehealth: Payer: Self-pay | Admitting: Family Medicine

## 2022-03-18 LAB — CBC
HCT: 36.1 % (ref 36.0–46.0)
Hemoglobin: 12 g/dL (ref 12.0–15.0)
MCHC: 33.1 g/dL (ref 30.0–36.0)
MCV: 88.6 fl (ref 78.0–100.0)
Platelets: 319 10*3/uL (ref 150.0–400.0)
RBC: 4.08 Mil/uL (ref 3.87–5.11)
RDW: 15 % (ref 11.5–15.5)
WBC: 7 10*3/uL (ref 4.0–10.5)

## 2022-03-18 LAB — LIPID PANEL
Cholesterol: 193 mg/dL (ref 0–200)
HDL: 92.4 mg/dL (ref >39.00)
LDL Cholesterol: 86 mg/dL (ref 0–99)
NonHDL: 100.54
Total CHOL/HDL Ratio: 2
Triglycerides: 74 mg/dL (ref 0.0–149.0)
VLDL: 14.8 mg/dL (ref 0.0–40.0)

## 2022-03-18 LAB — VITAMIN B12: Vitamin B-12: 1021 pg/mL — ABNORMAL HIGH (ref 211–911)

## 2022-03-18 LAB — IBC + FERRITIN
Ferritin: 4.6 ng/mL — ABNORMAL LOW (ref 10.0–291.0)
Iron: 37 ug/dL — ABNORMAL LOW (ref 42–145)
Saturation Ratios: 7.8 % — ABNORMAL LOW (ref 20.0–50.0)
TIBC: 474.6 ug/dL — ABNORMAL HIGH (ref 250.0–450.0)
Transferrin: 339 mg/dL (ref 212.0–360.0)

## 2022-03-18 LAB — MAGNESIUM: Magnesium: 2 mg/dL (ref 1.5–2.5)

## 2022-03-18 LAB — URINE CULTURE
MICRO NUMBER:: 14272264
Result:: NO GROWTH
SPECIMEN QUALITY:: ADEQUATE

## 2022-03-18 LAB — COMPREHENSIVE METABOLIC PANEL
ALT: 16 U/L (ref 0–35)
AST: 15 U/L (ref 0–37)
Albumin: 3.8 g/dL (ref 3.5–5.2)
Alkaline Phosphatase: 72 U/L (ref 39–117)
BUN: 14 mg/dL (ref 6–23)
CO2: 31 mEq/L (ref 19–32)
Calcium: 8.9 mg/dL (ref 8.4–10.5)
Chloride: 103 mEq/L (ref 96–112)
Creatinine, Ser: 0.74 mg/dL (ref 0.40–1.20)
GFR: 82.61 mL/min (ref >60.00)
Glucose, Bld: 135 mg/dL — ABNORMAL HIGH (ref 70–99)
Potassium: 4.1 mEq/L (ref 3.5–5.1)
Sodium: 139 mEq/L (ref 135–145)
Total Bilirubin: 0.2 mg/dL (ref 0.2–1.2)
Total Protein: 6.6 g/dL (ref 6.0–8.3)

## 2022-03-18 LAB — HEMOGLOBIN A1C: Hgb A1c MFr Bld: 8 % — ABNORMAL HIGH (ref 4.6–6.5)

## 2022-03-18 LAB — C-REACTIVE PROTEIN: CRP: <1 mg/dL (ref 0.5–20.0)

## 2022-03-18 LAB — VITAMIN D 25 HYDROXY (VIT D DEFICIENCY, FRACTURES): VITD: 41.88 ng/mL (ref 30.00–100.00)

## 2022-03-18 LAB — SEDIMENTATION RATE: Sed Rate: 38 mm/hr — ABNORMAL HIGH (ref 0–30)

## 2022-03-18 LAB — TSH: TSH: 0.86 u[IU]/mL (ref 0.35–5.50)

## 2022-03-18 NOTE — Telephone Encounter (Signed)
Patient states: -Pharmacy informed her that prior to being able to give her the glucose sensor, PCP needed to fill out a form prior to providing the equipment  - Pharmacy informed her they sent a form for completion on 12/05 and 12/06.   Have you received this form?

## 2022-03-20 NOTE — Progress Notes (Signed)
Please inform patient of the following:  Urine culture is negative.

## 2022-03-21 LAB — URINE CULTURE

## 2022-03-21 LAB — QUANTIFERON-TB GOLD PLUS
Mitogen-NIL: >10 IU/mL
NIL: 0.02 IU/mL
QuantiFERON-TB Gold Plus: NEGATIVE
TB1-NIL: 0.02 IU/mL
TB2-NIL: 0 IU/mL

## 2022-03-21 LAB — VITAMIN B6: Vitamin B6: 31.6 ng/mL — ABNORMAL HIGH (ref 2.1–21.7)

## 2022-03-21 LAB — VITAMIN B1: Vitamin B1 (Thiamine): 44 nmol/L — ABNORMAL HIGH (ref 8–30)

## 2022-03-23 NOTE — Progress Notes (Signed)
Please inform patient of the following:  All of her B vitamins are elevated. Do not think is causing significant issues but recommend she reduce dose of the supplementation or start taking every other day.  Can recheck again in 3 months.  Her iron is low. This could explain some of her symptoms with leg cramping and fatigue and maybe the night sweats.  Can we please verify if she is taking an iron supplement? If she is, then we may need to refer her to GI to see if there are any absorption issues.  If she is not taking an iron supplement recommend starting ferrous sulfate 65 mg every other day.  We should recheck this again in about 3 months.  Her A1c is up a bit.  We probably need to adjust the dose on her medications.  Recommend increasing Ozempic to 1 mg weekly.  Please send a new prescription if needed.  She can continue Toujeo 16 units daily.  I would like to see her back in 3 months to recheck A1c.  All the other labs are stable.  No other obvious explanatation for her night sweats.  Would like for her to let us know if this does not improve over the next couple of weeks.

## 2022-03-24 MED ORDER — SEMAGLUTIDE (1 MG/DOSE) 4 MG/3ML ~~LOC~~ SOPN: 1.0000 mg | PEN_INJECTOR | SUBCUTANEOUS | 0 refills | Status: DC

## 2022-03-26 NOTE — Telephone Encounter (Signed)
Spoke with patient, stated has not received glucose sensor yet

## 2022-03-31 ENCOUNTER — Other Ambulatory Visit: Payer: Self-pay | Admitting: Family Medicine

## 2022-04-02 NOTE — Progress Notes (Signed)
A1c is a measure of the least 3 months of blood sugar. Her being on prednisone should not significantly impact this number.

## 2022-04-28 ENCOUNTER — Other Ambulatory Visit: Payer: Self-pay | Admitting: Family Medicine

## 2022-04-28 DIAGNOSIS — E119 Type 2 diabetes mellitus without complications: Secondary | ICD-10-CM

## 2022-05-06 DIAGNOSIS — R1319 Other dysphagia: Secondary | ICD-10-CM | POA: Diagnosis not present

## 2022-05-06 DIAGNOSIS — Z9884 Bariatric surgery status: Secondary | ICD-10-CM | POA: Diagnosis not present

## 2022-05-06 DIAGNOSIS — Z800 Family history of malignant neoplasm of digestive organs: Secondary | ICD-10-CM | POA: Diagnosis not present

## 2022-06-23 ENCOUNTER — Other Ambulatory Visit: Payer: Self-pay | Admitting: Family Medicine

## 2022-06-30 DIAGNOSIS — L400 Psoriasis vulgaris: Secondary | ICD-10-CM | POA: Diagnosis not present

## 2022-06-30 DIAGNOSIS — D485 Neoplasm of uncertain behavior of skin: Secondary | ICD-10-CM | POA: Diagnosis not present

## 2022-06-30 DIAGNOSIS — D225 Melanocytic nevi of trunk: Secondary | ICD-10-CM | POA: Diagnosis not present

## 2022-06-30 DIAGNOSIS — L821 Other seborrheic keratosis: Secondary | ICD-10-CM | POA: Diagnosis not present

## 2022-06-30 DIAGNOSIS — D3611 Benign neoplasm of peripheral nerves and autonomic nervous system of face, head, and neck: Secondary | ICD-10-CM | POA: Diagnosis not present

## 2022-06-30 DIAGNOSIS — D1801 Hemangioma of skin and subcutaneous tissue: Secondary | ICD-10-CM | POA: Diagnosis not present

## 2022-06-30 DIAGNOSIS — D234 Other benign neoplasm of skin of scalp and neck: Secondary | ICD-10-CM | POA: Diagnosis not present

## 2022-07-08 ENCOUNTER — Telehealth: Payer: Self-pay | Admitting: Family Medicine

## 2022-07-08 NOTE — Telephone Encounter (Signed)
Patient states [facility] will be contacting Dr. Jerline Pain regarding: Rhonda Farley 2 Sensors

## 2022-07-09 NOTE — Telephone Encounter (Signed)
Noted  

## 2022-07-15 ENCOUNTER — Other Ambulatory Visit: Payer: Self-pay | Admitting: Family Medicine

## 2022-08-03 DIAGNOSIS — M25551 Pain in right hip: Secondary | ICD-10-CM | POA: Diagnosis not present

## 2022-08-24 ENCOUNTER — Ambulatory Visit (INDEPENDENT_AMBULATORY_CARE_PROVIDER_SITE_OTHER): Payer: Medicare Other | Admitting: Family Medicine

## 2022-08-24 VITALS — BP 107/71 | HR 99 | Temp 97.3°F | Ht 63.5 in | Wt 127.8 lb

## 2022-08-24 DIAGNOSIS — E611 Iron deficiency: Secondary | ICD-10-CM | POA: Diagnosis not present

## 2022-08-24 DIAGNOSIS — R0689 Other abnormalities of breathing: Secondary | ICD-10-CM

## 2022-08-24 DIAGNOSIS — E119 Type 2 diabetes mellitus without complications: Secondary | ICD-10-CM | POA: Diagnosis not present

## 2022-08-24 DIAGNOSIS — R61 Generalized hyperhidrosis: Secondary | ICD-10-CM | POA: Diagnosis not present

## 2022-08-24 DIAGNOSIS — R700 Elevated erythrocyte sedimentation rate: Secondary | ICD-10-CM

## 2022-08-24 DIAGNOSIS — M25551 Pain in right hip: Secondary | ICD-10-CM | POA: Diagnosis not present

## 2022-08-24 DIAGNOSIS — R634 Abnormal weight loss: Secondary | ICD-10-CM | POA: Diagnosis not present

## 2022-08-24 DIAGNOSIS — M6281 Muscle weakness (generalized): Secondary | ICD-10-CM | POA: Diagnosis not present

## 2022-08-24 DIAGNOSIS — E538 Deficiency of other specified B group vitamins: Secondary | ICD-10-CM

## 2022-08-24 DIAGNOSIS — R509 Fever, unspecified: Secondary | ICD-10-CM

## 2022-08-24 LAB — IBC + FERRITIN
Ferritin: 24 ng/mL (ref 10.0–291.0)
Iron: 158 ug/dL — ABNORMAL HIGH (ref 42–145)
Saturation Ratios: 44.6 % (ref 20.0–50.0)
TIBC: 354.2 ug/dL (ref 250.0–450.0)
Transferrin: 253 mg/dL (ref 212.0–360.0)

## 2022-08-24 LAB — POCT GLYCOSYLATED HEMOGLOBIN (HGB A1C): Hemoglobin A1C: 6.9 % — AB (ref 4.0–5.6)

## 2022-08-24 LAB — COMPREHENSIVE METABOLIC PANEL
ALT: 27 U/L (ref 0–35)
AST: 19 U/L (ref 0–37)
Albumin: 4 g/dL (ref 3.5–5.2)
Alkaline Phosphatase: 63 U/L (ref 39–117)
BUN: 17 mg/dL (ref 6–23)
CO2: 31 mEq/L (ref 19–32)
Calcium: 9.5 mg/dL (ref 8.4–10.5)
Chloride: 102 mEq/L (ref 96–112)
Creatinine, Ser: 0.77 mg/dL (ref 0.40–1.20)
GFR: 78.52 mL/min (ref >60.00)
Glucose, Bld: 132 mg/dL — ABNORMAL HIGH (ref 70–99)
Potassium: 4.8 mEq/L (ref 3.5–5.1)
Sodium: 140 mEq/L (ref 135–145)
Total Bilirubin: 0.4 mg/dL (ref 0.2–1.2)
Total Protein: 6.6 g/dL (ref 6.0–8.3)

## 2022-08-24 LAB — CBC
HCT: 42 % (ref 36.0–46.0)
Hemoglobin: 14.2 g/dL (ref 12.0–15.0)
MCHC: 34 g/dL (ref 30.0–36.0)
MCV: 95 fl (ref 78.0–100.0)
Platelets: 209 10*3/uL (ref 150.0–400.0)
RBC: 4.42 Mil/uL (ref 3.87–5.11)
RDW: 13.8 % (ref 11.5–15.5)
WBC: 7.3 10*3/uL (ref 4.0–10.5)

## 2022-08-24 LAB — TSH: TSH: 0.71 u[IU]/mL (ref 0.35–5.50)

## 2022-08-24 MED ORDER — FREESTYLE LIBRE 2 SENSOR MISC: 3 refills | Status: AC

## 2022-08-24 NOTE — Assessment & Plan Note (Signed)
A1c 6.9 though she has had fluctuating and labile blood sugars for the last few months.  She will try spacing her Toujeo to 8 units twice daily to see if this helps with some of the fluctuations.  She will continue with Ozempic 0.5 mg weekly.  She cannot tolerate higher doses of Ozempic due to side effects.  We can recheck A1c in 3 months.  We are checking labs today including CBC and c-Met

## 2022-08-24 NOTE — Progress Notes (Signed)
Rhonda Farley is a 72 y.o. female who presents today for an office visit.  Assessment/Plan:  Chronic Problems Addressed Today: Type 2 diabetes mellitus without complication ([name]) A1c 6.9 though she has had fluctuating and labile blood sugars for the last few months.  She will try spacing her Toujeo to 8 units twice daily to see if this helps with some of the fluctuations.  She will continue with Ozempic 0.5 mg weekly.  She cannot tolerate higher doses of Ozempic due to side effects.  We can recheck A1c in 3 months.  We are checking labs today including CBC and c-Met  Iron deficiency She is now back on iron supplement.  Will check iron level today. Colonoscopy is due this year - will need to make sure this is done.   Night sweats Symptoms are still persistent.  Has drenching night sweats multiple times per week.  Workup from her last visit here was negative.  It is possible this could be hormone related or potentially side effect of her Cymbalta however given severity and persistence of symptoms, will check CT chest abdomen and pelvis to rule out any other potential causes.  If imaging is negative would consider trial of Cymbalta to see if this improves her symptoms.     Subjective:  HPI:  See A/P for status of chronic conditions.  Patient is here today for diabetes follow-up.  Her A1c at that time was 8.0.  We recommend increasing her dose of Ozempic to 1 mg weekly.  She did this however had to discontinue due to GI side effects.  She is now back on 0.5 mg weekly.  She has had some intermittent issues with blood pressure fluctuating over the last couple of months.  She was getting numbers into the 40s at one point.  She backed off on her insulin and eventually stopped her Ozempic did well with this for period of time however her numbers started back up and she is now back on her previous regimen 16 units of Toujeo daily and 0.5 mg weekly of Ozempic.  She still has had issues with persistent  night sweats.  We discussed this at her last office visit here 6 months ago.  Labs at that time were essentially negative for any potential causes for her night sweats.  She still has soaking night sweats multiple times per week.  Has to change sheets at night close due to this.  No other recent illnesses.  At her labs last time she was also found to have low iron.  This was thought to be likely secondary to her history of gastric bypass.  She has restarted taking iron supplement and does feel like this is helped with some of the nocturnal cramping.       Objective:  Physical Exam: BP 107/71   Pulse 99   Temp (!) 97.3 F (36.3 C) (Temporal)   Ht 5' 3.5" (1.613 m)   Wt 127 lb 12.8 oz (58 kg)   SpO2 99%   BMI 22.28 kg/m   Wt Readings from Last 3 Encounters:  08/24/22 127 lb 12.8 oz (58 kg)  03/17/22 132 lb (59.9 kg)  11/04/21 120 lb (54.4 kg)    Gen: No acute distress, resting comfortably CV: Regular rate and rhythm with no murmurs appreciated Pulm: Normal work of breathing, clear to auscultation bilaterally with no crackles, wheezes, or rhonchi Neuro: Grossly normal, moves all extremities Psych: Normal affect and thought content      [address]  [name], MD 08/24/2022 2:30 PM

## 2022-08-24 NOTE — Patient Instructions (Addendum)
It was very nice to see you today!  Your A1c looks great.  Please space your insulin to 8 units twice daily.  Please go back to previous dose of Ozempic.  We will check blood work today.  We will send another prescription in for the glucometer.  Please let us know if there is anything else we can do to assist with this.  We will check CT scan today to look for any other possible causes of your night sweats.  Take care, Dr Jimmey Ralph  PLEASE NOTE:  If you had any lab tests, please let us know if you have not heard back within a few days. You may see your results on mychart before we have a chance to review them but we will give you a call once they are reviewed by [place].   If we ordered any referrals today, please let us know if you have not heard from their office within the next week.   If you had any urgent prescriptions sent in today, please check with the pharmacy within an hour of our visit to make sure the prescription was transmitted appropriately.   Please try these tips to maintain a healthy lifestyle:  Eat at least 3 REAL meals and 1-2 snacks per day.  Aim for no more than 5 hours between eating.  If you eat breakfast, please do so within one hour of getting up.   Each meal should contain half fruits/vegetables, one quarter protein, and one quarter carbs (no bigger than a computer mouse)  Cut down on sweet beverages. This includes juice, soda, and sweet tea.   Drink at least 1 glass of water with each meal and aim for at least 8 glasses per day  Exercise at least 150 minutes every week.

## 2022-08-24 NOTE — Assessment & Plan Note (Addendum)
Symptoms are still persistent.  Has drenching night sweats multiple times per week.  Workup from her last visit here was negative.  It is possible this could be hormone related or potentially side effect of her Cymbalta however given severity and persistence of symptoms, will check CT chest abdomen and pelvis to rule out any other potential causes.  If imaging is negative would consider trial of Cymbalta to see if this improves her symptoms.

## 2022-08-24 NOTE — Assessment & Plan Note (Addendum)
She is now back on iron supplement.  Will check iron level today. Colonoscopy is due this year - will need to make sure this is done.

## 2022-08-25 DIAGNOSIS — R1319 Other dysphagia: Secondary | ICD-10-CM | POA: Diagnosis not present

## 2022-08-25 DIAGNOSIS — Z9884 Bariatric surgery status: Secondary | ICD-10-CM | POA: Diagnosis not present

## 2022-08-25 DIAGNOSIS — Z800 Family history of malignant neoplasm of digestive organs: Secondary | ICD-10-CM | POA: Diagnosis not present

## 2022-08-25 DIAGNOSIS — Z1509 Genetic susceptibility to other malignant neoplasm: Secondary | ICD-10-CM | POA: Diagnosis not present

## 2022-08-25 DIAGNOSIS — Z1589 Genetic susceptibility to other disease: Secondary | ICD-10-CM | POA: Diagnosis not present

## 2022-08-25 DIAGNOSIS — D485 Neoplasm of uncertain behavior of skin: Secondary | ICD-10-CM | POA: Diagnosis not present

## 2022-08-25 DIAGNOSIS — Z1211 Encounter for screening for malignant neoplasm of colon: Secondary | ICD-10-CM | POA: Diagnosis not present

## 2022-08-25 DIAGNOSIS — E118 Type 2 diabetes mellitus with unspecified complications: Secondary | ICD-10-CM | POA: Diagnosis not present

## 2022-08-25 NOTE — Progress Notes (Signed)
Iron counts are back to normal.  She can continue taking her current iron supplementation.  Can we make sure she had her colonoscopy performed?  Please obtain records if she has already had this performed.  The rest of her labs are all normal.  We are waiting on the results of her CT scan and we will contact her once we get these back.

## 2022-09-01 DIAGNOSIS — M6281 Muscle weakness (generalized): Secondary | ICD-10-CM | POA: Diagnosis not present

## 2022-09-01 DIAGNOSIS — M25551 Pain in right hip: Secondary | ICD-10-CM | POA: Diagnosis not present

## 2022-09-08 DIAGNOSIS — M25551 Pain in right hip: Secondary | ICD-10-CM | POA: Diagnosis not present

## 2022-09-08 DIAGNOSIS — M6281 Muscle weakness (generalized): Secondary | ICD-10-CM | POA: Diagnosis not present

## 2022-09-10 DIAGNOSIS — M6281 Muscle weakness (generalized): Secondary | ICD-10-CM | POA: Diagnosis not present

## 2022-09-10 DIAGNOSIS — M25551 Pain in right hip: Secondary | ICD-10-CM | POA: Diagnosis not present

## 2022-09-14 ENCOUNTER — Ambulatory Visit (INDEPENDENT_AMBULATORY_CARE_PROVIDER_SITE_OTHER): Payer: Medicare Other | Admitting: Family Medicine

## 2022-09-14 VITALS — BP 104/68 | HR 74 | Temp 98.5°F | Ht 64.5 in | Wt 126.4 lb

## 2022-09-14 DIAGNOSIS — R059 Cough, unspecified: Secondary | ICD-10-CM

## 2022-09-14 DIAGNOSIS — R1319 Other dysphagia: Secondary | ICD-10-CM | POA: Insufficient documentation

## 2022-09-14 DIAGNOSIS — J029 Acute pharyngitis, unspecified: Secondary | ICD-10-CM | POA: Diagnosis not present

## 2022-09-14 DIAGNOSIS — F32A Depression, unspecified: Secondary | ICD-10-CM | POA: Insufficient documentation

## 2022-09-14 DIAGNOSIS — C801 Malignant (primary) neoplasm, unspecified: Secondary | ICD-10-CM | POA: Insufficient documentation

## 2022-09-14 LAB — POC INFLUENZA A&B (BINAX/QUICKVUE)
Influenza A, POC: NEGATIVE
Influenza B, POC: NEGATIVE

## 2022-09-14 LAB — POC COVID19 BINAXNOW: SARS Coronavirus 2 Ag: NEGATIVE

## 2022-09-14 LAB — POCT RAPID STREP A (OFFICE): Rapid Strep A Screen: NEGATIVE

## 2022-09-14 MED ORDER — PROMETHAZINE-DM 6.25-15 MG/5ML PO SYRP
5.0000 mL | ORAL_SOLUTION | Freq: Four times a day (QID) | ORAL | 0 refills | Status: DC | PRN
Start: 2022-09-14 — End: 2022-12-16

## 2022-09-14 NOTE — Patient Instructions (Addendum)
-  I hope you get to feeling better soon.  -Negative rapid covid, influenza, and strep throat.  -Prescribed Promethazine/DM cough syrup. Take every 4 hours as needed for cough. Medication will cause drowsiness.  -Rest  -Hydrate with water -Recommend to take Mucinex. -Alternate Tylenol 1000mg  and Ibuprofen 600-800mg  every 4 hours for pain, headache, and body aches. Recommend to eat something when taking Ibuprofen.   -Follow up if not improved in 1 week.

## 2022-09-14 NOTE — Progress Notes (Signed)
Acute Office Visit   Subjective:  Patient ID: Rhonda Farley, female    DOB: Sep 14, 1950, 72 y.o.   MRN: [id number]  Chief Complaint  Patient presents with   Sore Throat    Pt reports sore throat,left ear pain- Sx started last night Otc Cough med and tessalon    HPI Patient is a 72 year old caucasian female that presents with sore throat, productive cough, right sided headache, and left ear pain.   Symptoms started last night.   Denies fever, chest pain, shortness of breath, dizziness, lightheadedness nasal congestion or drainage.   Took OTC cough medication and Tessalon Perles with no relief.   ROS See HPI above      Objective:   BP 104/68   Pulse 74   Temp 98.5 F (36.9 C)   Ht 5' 4.5" (1.638 m)   Wt 126 lb 6 oz (57.3 kg)   SpO2 98%   BMI 21.36 kg/m    Physical Exam Vitals reviewed.  Constitutional:      General: She is not in acute distress.    Appearance: Normal appearance. She is not ill-appearing, toxic-appearing or diaphoretic.  HENT:     Head: Normocephalic and atraumatic.     Right Ear: Tympanic membrane, ear canal and external ear normal.     Left Ear: Tympanic membrane, ear canal and external ear normal.     Nose:     Right Sinus: No maxillary sinus tenderness or frontal sinus tenderness.     Left Sinus: No maxillary sinus tenderness or frontal sinus tenderness.     Mouth/Throat:     Mouth: Mucous membranes are moist.     Pharynx: Oropharynx is clear. Uvula midline. No pharyngeal swelling, oropharyngeal exudate, posterior oropharyngeal erythema or uvula swelling.  Eyes:     General:        Right eye: No discharge.        Left eye: No discharge.     Conjunctiva/sclera: Conjunctivae normal.  Cardiovascular:     Rate and Rhythm: Normal rate and regular rhythm.     Heart sounds: Normal heart sounds. No murmur heard.    No friction rub. No gallop.  Pulmonary:     Effort: Pulmonary effort is normal. No respiratory distress.     Breath sounds:  Normal breath sounds.  Musculoskeletal:        General: Normal range of motion.  Lymphadenopathy:     Head:     Right side of head: No submental or submandibular adenopathy.     Left side of head: No submental or submandibular adenopathy.  Skin:    General: Skin is warm and dry.  Neurological:     General: No focal deficit present.     Mental Status: She is alert and oriented to person, place, and time. Mental status is at baseline.  Psychiatric:        Mood and Affect: Mood normal.        Behavior: Behavior normal.        Thought Content: Thought content normal.        Judgment: Judgment normal.       Assessment & Plan:  Sore throat -     POCT rapid strep A -     POC Influenza A&B(BINAX/QUICKVUE) -     POC COVID-19 BinaxNow  Cough, unspecified type -     Promethazine-DM; Take 5 mLs by mouth 4 (four) times daily as needed for cough.  Dispense: 118 mL; Refill:  0  -Negative rapid covid, influenza, and strep throat.  -Prescribed Promethazine/DM cough syrup. Take every 4 hours as needed for cough. Medication will cause drowsiness.  -Rest  -Hydrate with water -Recommend to take Mucinex. -Alternate Tylenol 1000mg  and Ibuprofen 600-800mg  every 4 hours for pain, headache, and body aches. Recommend to eat something when taking Ibuprofen.   -Provided information about upper respiratory infection. Suspect this is a viral infection. No signs of bacterial infection.  -Follow up if not improved in 1 week.  -Patient expressed understanding and agree to plan of care.   Zandra Abts, NP

## 2022-09-15 ENCOUNTER — Ambulatory Visit: Payer: Medicare Other | Admitting: Family Medicine

## 2022-09-17 ENCOUNTER — Encounter: Payer: Self-pay | Admitting: Family Medicine

## 2022-09-17 ENCOUNTER — Ambulatory Visit (INDEPENDENT_AMBULATORY_CARE_PROVIDER_SITE_OTHER): Payer: Medicare Other | Admitting: Family Medicine

## 2022-09-17 VITALS — BP 100/68 | HR 100 | Temp 98.3°F | Resp 16 | Ht 64.5 in | Wt 126.5 lb

## 2022-09-17 DIAGNOSIS — B9689 Other specified bacterial agents as the cause of diseases classified elsewhere: Secondary | ICD-10-CM | POA: Diagnosis not present

## 2022-09-17 DIAGNOSIS — J329 Chronic sinusitis, unspecified: Secondary | ICD-10-CM

## 2022-09-17 MED ORDER — AMOXICILLIN 875 MG PO TABS: 875.0000 mg | ORAL_TABLET | Freq: Two times a day (BID) | ORAL | 0 refills | Status: AC

## 2022-09-17 NOTE — Progress Notes (Signed)
   Subjective:    Patient ID: Rhonda Farley, female    DOB: 1951-03-15, 72 y.o.   MRN: [id number]  HPI Viral illness- pt was seen on Tuesday for 'sore throat, earache, and cough'.  Last night developed fever to 101.  Cough is deeper and now productive of green sputum.  Some sinus pressure.  Bilateral ear pain, L>R.  No body aches or chills.  Throat remains very sore- gargling w/ salt water.  No SOB or wheezing.  + sick contacts- sinusitis and bronchitis.     Review of Systems For ROS see HPI     Objective:   Physical Exam Vitals reviewed.  Constitutional:      General: She is not in acute distress.    Appearance: Normal appearance. She is well-developed. She is not ill-appearing.  HENT:     Head: Normocephalic and atraumatic.     Right Ear: Tympanic membrane normal.     Left Ear: Tympanic membrane normal.     Nose: Mucosal edema and congestion present. No rhinorrhea.     Right Sinus: Maxillary sinus tenderness and frontal sinus tenderness present.     Left Sinus: Maxillary sinus tenderness and frontal sinus tenderness present.     Mouth/Throat:     Pharynx: Uvula midline. Posterior oropharyngeal erythema (copious PND) present. No oropharyngeal exudate.  Eyes:     Conjunctiva/sclera: Conjunctivae normal.     Pupils: Pupils are equal, round, and reactive to light.  Cardiovascular:     Rate and Rhythm: Normal rate and regular rhythm.     Heart sounds: Normal heart sounds.  Pulmonary:     Effort: Pulmonary effort is normal. No respiratory distress.     Breath sounds: Normal breath sounds. No wheezing.  Musculoskeletal:     Cervical back: Normal range of motion and neck supple.  Lymphadenopathy:     Cervical: No cervical adenopathy.  Skin:    General: Skin is warm and dry.  Neurological:     General: No focal deficit present.     Mental Status: She is alert and oriented to person, place, and time.     Cranial Nerves: No cranial nerve deficit.     Motor: No weakness.      Coordination: Coordination normal.  Psychiatric:        Mood and Affect: Mood normal.        Behavior: Behavior normal.        Thought Content: Thought content normal.           Assessment & Plan:   Bacterial sinusitis- new.  Pt's sxs and PE consistent w/ sinus infection.  Since sxs are worsening rather than improving, will start abx.  Amoxicillin 875mg  BID.  Reviewed supportive care and red flags that should prompt return.  Pt expressed understanding and is in agreement w/ plan.

## 2022-09-17 NOTE — Patient Instructions (Signed)
Follow up as needed or as scheduled START the Amoxicillin twice daily as directed- take w/ food CONTINUE the daily Zyrtec ADD OTC Mucinex DM for cough and congestion Drink LOTS of fluids REST! Hang in there!!!

## 2022-09-29 DIAGNOSIS — M25551 Pain in right hip: Secondary | ICD-10-CM | POA: Diagnosis not present

## 2022-09-29 DIAGNOSIS — M6281 Muscle weakness (generalized): Secondary | ICD-10-CM | POA: Diagnosis not present

## 2022-10-06 DIAGNOSIS — M25551 Pain in right hip: Secondary | ICD-10-CM | POA: Diagnosis not present

## 2022-10-06 DIAGNOSIS — M6281 Muscle weakness (generalized): Secondary | ICD-10-CM | POA: Diagnosis not present

## 2022-10-11 ENCOUNTER — Other Ambulatory Visit: Payer: Self-pay | Admitting: Family Medicine

## 2022-10-12 ENCOUNTER — Other Ambulatory Visit: Payer: Self-pay | Admitting: Family Medicine

## 2022-10-13 DIAGNOSIS — M25551 Pain in right hip: Secondary | ICD-10-CM | POA: Diagnosis not present

## 2022-10-13 DIAGNOSIS — M6281 Muscle weakness (generalized): Secondary | ICD-10-CM | POA: Diagnosis not present

## 2022-10-20 DIAGNOSIS — M6281 Muscle weakness (generalized): Secondary | ICD-10-CM | POA: Diagnosis not present

## 2022-10-20 DIAGNOSIS — M25551 Pain in right hip: Secondary | ICD-10-CM | POA: Diagnosis not present

## 2022-10-27 DIAGNOSIS — M25551 Pain in right hip: Secondary | ICD-10-CM | POA: Diagnosis not present

## 2022-10-27 DIAGNOSIS — M6281 Muscle weakness (generalized): Secondary | ICD-10-CM | POA: Diagnosis not present

## 2022-11-03 DIAGNOSIS — M6281 Muscle weakness (generalized): Secondary | ICD-10-CM | POA: Diagnosis not present

## 2022-11-03 DIAGNOSIS — M25551 Pain in right hip: Secondary | ICD-10-CM | POA: Diagnosis not present

## 2022-11-07 DIAGNOSIS — R059 Cough, unspecified: Secondary | ICD-10-CM | POA: Diagnosis not present

## 2022-11-07 DIAGNOSIS — E119 Type 2 diabetes mellitus without complications: Secondary | ICD-10-CM | POA: Diagnosis not present

## 2022-11-07 DIAGNOSIS — Z20822 Contact with and (suspected) exposure to covid-19: Secondary | ICD-10-CM | POA: Diagnosis not present

## 2022-11-07 DIAGNOSIS — J018 Other acute sinusitis: Secondary | ICD-10-CM | POA: Diagnosis not present

## 2022-11-07 DIAGNOSIS — R110 Nausea: Secondary | ICD-10-CM | POA: Diagnosis not present

## 2022-11-07 DIAGNOSIS — Z0130 Encounter for examination of blood pressure without abnormal findings: Secondary | ICD-10-CM | POA: Diagnosis not present

## 2022-11-09 ENCOUNTER — Other Ambulatory Visit: Payer: Self-pay | Admitting: Family Medicine

## 2022-11-17 ENCOUNTER — Ambulatory Visit (INDEPENDENT_AMBULATORY_CARE_PROVIDER_SITE_OTHER): Payer: Medicare Other | Admitting: Family Medicine

## 2022-11-17 ENCOUNTER — Encounter: Payer: Self-pay | Admitting: Family Medicine

## 2022-11-17 ENCOUNTER — Other Ambulatory Visit (HOSPITAL_COMMUNITY): Payer: Self-pay

## 2022-11-17 VITALS — BP 95/61 | HR 73 | Temp 97.5°F | Ht 64.5 in | Wt 131.0 lb

## 2022-11-17 DIAGNOSIS — M25551 Pain in right hip: Secondary | ICD-10-CM | POA: Diagnosis not present

## 2022-11-17 DIAGNOSIS — E119 Type 2 diabetes mellitus without complications: Secondary | ICD-10-CM | POA: Diagnosis not present

## 2022-11-17 DIAGNOSIS — Z7985 Long-term (current) use of injectable non-insulin antidiabetic drugs: Secondary | ICD-10-CM | POA: Diagnosis not present

## 2022-11-17 DIAGNOSIS — M549 Dorsalgia, unspecified: Secondary | ICD-10-CM

## 2022-11-17 DIAGNOSIS — M6281 Muscle weakness (generalized): Secondary | ICD-10-CM | POA: Diagnosis not present

## 2022-11-17 MED ORDER — CYCLOBENZAPRINE HCL 10 MG PO TABS: 10.0000 mg | ORAL_TABLET | Freq: Three times a day (TID) | ORAL | 3 refills | Status: AC | PRN

## 2022-11-17 NOTE — Progress Notes (Signed)
   Rhonda Farley is a 72 y.o. female who presents today for an office visit.  Assessment/Plan:  Chronic Problems Addressed Today: Type 2 diabetes mellitus without complication ([name]) Too early to recheck A1c today.  Last A1c was 6.9.  She has done better with her blood sugars since we spaced out her Toujeo to 8 units twice daily.  No longer having fluctuations.  She is also on Ozempic 0.5 mg weekly.  She will come back next week to recheck A1c.  Back pain No red flags. Longstanding issue that is now worsening. She has seen a few physicians for this in the past but has never had complete workup including imaging.  She does have a history of gastric bypass and needs to avoid NSAIDs.  Will refer to sports medicine for further evaluation management.  Will refill her Flexeril today.     Subjective:  HPI:  See Assessment / plan for status of chronic conditions.  We last saw her about 3 months ago. At that time her A1c was 6.9  She has been having issues with chronic back pain.  This has been an ongoing issue for many years. She has seen a few doctors for this several years ago.  She tried a back brace which did not help.  She was told that it was due to fibromyalgia.  She has tried injections in the back which has not helped either.  She has never had any imaging done on her back.       Objective:  Physical Exam: BP 95/61   Pulse 73   Temp (!) 97.5 F (36.4 C) (Temporal)   Ht 5' 4.5" (1.638 m)   Wt 131 lb (59.4 kg)   SpO2 98%   BMI 22.14 kg/m   Gen: No acute distress, resting comfortably Neuro: Grossly normal, moves all extremities Psych: Normal affect and thought content       M. Jimmey Ralph, MD 11/17/2022 2:42 PM

## 2022-11-17 NOTE — Assessment & Plan Note (Signed)
Too early to recheck A1c today.  Last A1c was 6.9.  She has done better with her blood sugars since we spaced out her Toujeo to 8 units twice daily.  No longer having fluctuations.  She is also on Ozempic 0.5 mg weekly.  She will come back next week to recheck A1c.

## 2022-11-17 NOTE — Patient Instructions (Signed)
It was very nice to see you today!  Please come back next week to recheck your A1c.  We will refer you to sports medicine for your back pain.  Return in about 6 months (around 05/20/2023) for Follow Up.   Take care, Dr Jimmey Ralph  PLEASE NOTE:  If you had any lab tests, please let us know if you have not heard back within a few days. You may see your results on mychart before we have a chance to review them but we will give you a call once they are reviewed by [place].   If we ordered any referrals today, please let us know if you have not heard from their office within the next week.   If you had any urgent prescriptions sent in today, please check with the pharmacy within an hour of our visit to make sure the prescription was transmitted appropriately.   Please try these tips to maintain a healthy lifestyle:  Eat at least 3 REAL meals and 1-2 snacks per day.  Aim for no more than 5 hours between eating.  If you eat breakfast, please do so within one hour of getting up.   Each meal should contain half fruits/vegetables, one quarter protein, and one quarter carbs (no bigger than a computer mouse)  Cut down on sweet beverages. This includes juice, soda, and sweet tea.   Drink at least 1 glass of water with each meal and aim for at least 8 glasses per day  Exercise at least 150 minutes every week.

## 2022-11-17 NOTE — Assessment & Plan Note (Signed)
No red flags. Longstanding issue that is now worsening. She has seen a few physicians for this in the past but has never had complete workup including imaging.  She does have a history of gastric bypass and needs to avoid NSAIDs.  Will refer to sports medicine for further evaluation management.  Will refill her Flexeril today.

## 2022-11-18 DIAGNOSIS — E119 Type 2 diabetes mellitus without complications: Secondary | ICD-10-CM | POA: Diagnosis not present

## 2022-11-18 DIAGNOSIS — H524 Presbyopia: Secondary | ICD-10-CM | POA: Diagnosis not present

## 2022-11-18 DIAGNOSIS — H2513 Age-related nuclear cataract, bilateral: Secondary | ICD-10-CM | POA: Diagnosis not present

## 2022-11-18 DIAGNOSIS — H43813 Vitreous degeneration, bilateral: Secondary | ICD-10-CM | POA: Diagnosis not present

## 2022-11-19 ENCOUNTER — Ambulatory Visit (INDEPENDENT_AMBULATORY_CARE_PROVIDER_SITE_OTHER): Payer: Medicare Other | Admitting: Family Medicine

## 2022-11-19 ENCOUNTER — Ambulatory Visit (INDEPENDENT_AMBULATORY_CARE_PROVIDER_SITE_OTHER): Payer: Medicare Other

## 2022-11-19 ENCOUNTER — Encounter: Payer: Self-pay | Admitting: Family Medicine

## 2022-11-19 VITALS — BP 120/76 | HR 98 | Ht 64.5 in | Wt 131.0 lb

## 2022-11-19 DIAGNOSIS — M546 Pain in thoracic spine: Secondary | ICD-10-CM | POA: Diagnosis not present

## 2022-11-19 DIAGNOSIS — G8929 Other chronic pain: Secondary | ICD-10-CM | POA: Diagnosis not present

## 2022-11-19 DIAGNOSIS — M4184 Other forms of scoliosis, thoracic region: Secondary | ICD-10-CM | POA: Diagnosis not present

## 2022-11-19 DIAGNOSIS — M47815 Spondylosis without myelopathy or radiculopathy, thoracolumbar region: Secondary | ICD-10-CM | POA: Diagnosis not present

## 2022-11-19 DIAGNOSIS — M549 Dorsalgia, unspecified: Secondary | ICD-10-CM | POA: Diagnosis not present

## 2022-11-19 DIAGNOSIS — M47814 Spondylosis without myelopathy or radiculopathy, thoracic region: Secondary | ICD-10-CM | POA: Diagnosis not present

## 2022-11-19 NOTE — Patient Instructions (Signed)
Thank you for coming in today.   Please get an Xray today before you leave   I've referred you to Physical Therapy.  Let us know if you don't hear from them in one week.   Continue heating pad and TENS unit.   Let me know if you have any problems.   Recheck in 6 weeks.

## 2022-11-19 NOTE — Progress Notes (Signed)
   I, Stevenson Clinch, CMA acting as a scribe for Clementeen Graham, MD.  Rhonda Farley is a 72 y.o. female who presents to [facility] at [facility] today for thoracic back pain that's chronic in nature, but worsening over the past year. Pt locates pain to bilaterla mid-back. Denies bowel bladder dysfunction, injury, SOB. Sx worse when wearing a bra, occasionally when laying in bed at times. Also worse with standing for longer periods of time. Constant deep ache, burning occasionally. Has tried alternative therapies in the past.   Radiates: no Aggravates: as above Treatments tried: flexeril, Voltaren, TENS  Pertinent review of systems: No fevers or chills  Relevant historical information: Diabetic neuropathy Gastric bypass with B12 deficiency.  Exam:  BP 120/76   Pulse 98   Ht 5' 4.5" (1.638 m)   Wt 131 lb (59.4 kg)   SpO2 97%   BMI 22.14 kg/m  General: Well Developed, well nourished, and in no acute distress.   MSK: T-spine: Normal appearing Normal motion. Nontender.  Bilateral shoulders normal appearing Decreased abduction range of motion.  Normal scapular motion.     Lab and Radiology Results  X-ray images T-spine obtained today personally and independently interpreted No acute fractures.  Anterior cervical fusion hardware intact lower portion cervical spine. Await formal radiology review     Assessment and Plan: 72 y.o. female with pain bilateral periscapular areas.  Pain thought to be muscle dysfunction or perhaps thoracic radiculopathy.  She is a good candidate for trial of physical therapy.  Recommend also heating pad and TENS unit.  Recheck in 6 weeks.   PDMP not reviewed this encounter. Orders Placed This Encounter  Procedures   DG Thoracic Spine W/Swimmers    Standing Status:   Future    Number of Occurrences:   1    Standing Expiration Date:   11/19/2023    Order Specific Question:   Reason for Exam (SYMPTOM  OR DIAGNOSIS REQUIRED)     Answer:   thoracic back pain    Order Specific Question:   Preferred imaging location?    Answer:   [facility]   Ambulatory referral to Physical Therapy    Referral Priority:   Routine    Referral Type:   Physical Medicine    Referral Reason:   Specialty Services Required    Requested Specialty:   Physical Therapy    Number of Visits Requested:   1   No orders of the defined types were placed in this encounter.    Discussed warning signs or symptoms. Please see discharge instructions. Patient expresses understanding.   The above documentation has been reviewed and is accurate and complete Clementeen Graham, M.D.

## 2022-11-23 ENCOUNTER — Other Ambulatory Visit (HOSPITAL_COMMUNITY): Payer: Self-pay

## 2022-11-24 ENCOUNTER — Other Ambulatory Visit: Payer: Medicare Other

## 2022-11-24 DIAGNOSIS — M25551 Pain in right hip: Secondary | ICD-10-CM | POA: Diagnosis not present

## 2022-11-24 DIAGNOSIS — M6281 Muscle weakness (generalized): Secondary | ICD-10-CM | POA: Diagnosis not present

## 2022-11-29 ENCOUNTER — Other Ambulatory Visit: Payer: Self-pay | Admitting: Family Medicine

## 2022-11-30 NOTE — Progress Notes (Signed)
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

## 2022-12-01 DIAGNOSIS — M25551 Pain in right hip: Secondary | ICD-10-CM | POA: Diagnosis not present

## 2022-12-01 DIAGNOSIS — M6281 Muscle weakness (generalized): Secondary | ICD-10-CM | POA: Diagnosis not present

## 2022-12-10 ENCOUNTER — Other Ambulatory Visit: Payer: Self-pay | Admitting: Family Medicine

## 2022-12-11 ENCOUNTER — Other Ambulatory Visit: Payer: Self-pay | Admitting: *Deleted

## 2022-12-11 MED ORDER — BUTALBITAL-APAP-CAFFEINE 50-325-40 MG PO TABS: 2.0000 | ORAL_TABLET | ORAL | 3 refills | Status: DC | PRN

## 2022-12-16 ENCOUNTER — Encounter: Payer: Self-pay | Admitting: Family Medicine

## 2022-12-16 ENCOUNTER — Telehealth (INDEPENDENT_AMBULATORY_CARE_PROVIDER_SITE_OTHER): Payer: Medicare Other | Admitting: Family Medicine

## 2022-12-16 VITALS — Temp 101.2°F | Ht 64.5 in

## 2022-12-16 DIAGNOSIS — R051 Acute cough: Secondary | ICD-10-CM | POA: Diagnosis not present

## 2022-12-16 DIAGNOSIS — J4520 Mild intermittent asthma, uncomplicated: Secondary | ICD-10-CM | POA: Diagnosis not present

## 2022-12-16 DIAGNOSIS — U071 COVID-19: Secondary | ICD-10-CM

## 2022-12-16 MED ORDER — HYDROCODONE BIT-HOMATROP MBR 5-1.5 MG/5ML PO SOLN
5.0000 mL | Freq: Two times a day (BID) | ORAL | 0 refills | Status: AC | PRN
Start: 2022-12-16 — End: 2022-12-26

## 2022-12-16 MED ORDER — PREDNISONE 20 MG PO TABS
40.0000 mg | ORAL_TABLET | Freq: Every day | ORAL | 0 refills | Status: AC
Start: 2022-12-16 — End: 2022-12-21

## 2022-12-16 MED ORDER — NIRMATRELVIR/RITONAVIR (PAXLOVID)TABLET
3.0000 | ORAL_TABLET | Freq: Two times a day (BID) | ORAL | 0 refills | Status: AC
Start: 2022-12-16 — End: 2022-12-21

## 2022-12-16 NOTE — Progress Notes (Signed)
Virtual Visit via Video Note I connected with Rhonda Farley on 12/16/22 by a video enabled telemedicine application and verified that I am speaking with the correct person using two identifiers. Location patient: home Location provider:work office Persons participating in the virtual visit: patient, provider  I discussed the limitations of evaluation and management by telemedicine and the availability of in person appointments. The patient expressed understanding and agreed to proceed.  Chief Complaint  Patient presents with   Covid Positive    Tested positive, cough, non-productive, 101.2. has taken tylenol for headache.    HPI: Rhonda Farley is a 72 year old female with history of DM 2, GERD, fibromyalgia, asthma, hypertension, and seasonal allergies reporting a positive COVID 19 test at home that today. She reports having symptoms since yesterday including: Fatigue, body aches, fevers of 101.2 F, headache, nasal congestion, rhinorrhea, and nonproductive cough.  She has a history of asthma and just resumed Symbicort 80-4.5 mcg twice daily due to increased difficulty breathing and chest tightness.  Denies sore throat, wheezing, palpitations, abdominal pain, nausea, vomiting, changes in bowel habits, urinary symptoms, or skin rash.  She has been taking Tylenol and Zyrtec for symptom relief but has not found relief from cough with previously prescribed promethazine and Tessalon Perles.   ROS: See pertinent positives and negatives per HPI.  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    Chronic headaches    Depression    Diabetes mellitus    Fibromyalgia    GERD (gastroesophageal reflux disease)    Hyperlipidemia    Hypertension    no per patient   Migraines    Obesity    Pneumonia    Shingles    Tachycardia    Thyroid disease     Past Surgical History:  Procedure  Laterality Date   ABDOMINAL EXPLORATION SURGERY     BUNIONECTOMY     CERVICAL LAMINECTOMY  2000   ROTATOR CUFF REPAIR  01/2010   right   ROUX-EN-Y PROCEDURE  2009   TONSILLECTOMY      Family History  Problem Relation Age of Onset   Arthritis Mother    Cancer Mother        colon/skin   Colon cancer Mother    Cancer Father        lung/prostate   Diabetes Paternal Uncle    Colon cancer Paternal Uncle    Arthritis Paternal Grandmother    Heart disease Paternal Grandmother    Arthritis Paternal Grandfather    Cancer Paternal Grandfather        prostate   Esophageal cancer Maternal Aunt     Social History   Socioeconomic History   Marital status: Widowed    Spouse name: Not on file   Number of children: Not on file   Years of education: Not on file   Highest education level: Bachelor's degree (e.g., BA, AB, BS)  Occupational History   Not on file  Tobacco Use   Smoking status: Former    Current packs/day: 0.00    Types: Cigarettes    Quit date: 04/13/1977    Years since quitting: 45.7   Smokeless tobacco: Never  Vaping Use   Vaping status: Never Used  Substance and Sexual Activity   Alcohol use: No   Drug use: No   Sexual activity: Not on file  Other Topics Concern   Not on file  Social History Narrative   Not on file   Social Determinants of Health   Financial Resource Strain: Low Risk  (08/24/2022)   Overall Financial Resource Strain (CARDIA)    Difficulty of Paying Living Expenses: Not hard at all  Food Insecurity: No Food Insecurity (08/24/2022)   Hunger Vital Sign    Worried About Running Out of Food in the Last Year: Never true    Ran Out of Food in the Last Year: Never true  Transportation Needs: No Transportation Needs (08/24/2022)   PRAPARE - Administrator, Civil Service (Medical): No    Lack of Transportation (Non-Medical): No  Physical Activity: Insufficiently Active (08/24/2022)   Exercise Vital Sign    Days of Exercise per Week: 4  days    Minutes of Exercise per Session: 30 min  Stress: No Stress Concern Present (08/24/2022)   [employer] of Occupational Health - Occupational Stress Questionnaire    Feeling of Stress : Not at all  Social Connections: Moderately Isolated (08/24/2022)   Social Connection and Isolation Panel [NHANES]    Frequency of Communication with Friends and Family: More than three times a week    Frequency of Social Gatherings with Friends and Family: More than three times a week    Attends Religious Services: More than 4 times per year    Active Member of [employer] or Organizations: No    Attends Banker Meetings: Never    Marital Status: Widowed  Intimate Partner Violence: Not At Risk (01/26/2022)   Humiliation, Afraid, Rape, and Kick questionnaire    Fear of Current or Ex-Partner: No    Emotionally Abused: No    Physically Abused: No    Sexually Abused: No     Current Outpatient Medications:    azelastine (ASTELIN) 0.1 % nasal spray, Place 2 sprays into both nostrils 2 (two) times daily., Disp: 30 mL, Rfl: 12   B Complex-C (SUPER B COMPLEX PO), Take by mouth daily., Disp: , Rfl:    budesonide-formoterol (SYMBICORT) 80-4.5 MCG/ACT inhaler, Inhale 2 puffs into the lungs as needed., Disp: 10.2 g, Rfl: 5   buPROPion (WELLBUTRIN XL) 150 MG 24 hr tablet, TAKE 1 TABLET BY MOUTH EVERY DAY IN THE MORNING, Disp: 90 tablet, Rfl: 1   butalbital-acetaminophen-caffeine (FIORICET) 50-325-40 MG tablet, Take 2 tablets by mouth as needed., Disp: 30 tablet, Rfl: 3   CALCIUM ACETATE-MAGNESIUM CARB PO, Take by mouth. Vit d 3, Disp: , Rfl:    cetirizine (ZYRTEC) 10 MG tablet, Take 10 mg by mouth daily., Disp: , Rfl:    Continuous Blood Gluc Receiver (FREESTYLE LIBRE READER) DEVI, Use up to 4 times daily to check blood sugar., Disp: 1 each, Rfl: 0   Continuous Glucose Sensor (FREESTYLE LIBRE 2 SENSOR) MISC, USE AS DIRECTED E11.9, Disp: 6 each, Rfl: 3   cyanocobalamin (VITAMIN B12) 1000 MCG/ML  injection, INJECT 1 ML INTO THE MUSCLE EVERY 30 DAYS. PT MAY SELF ADMINSTER, Disp: 3 mL, Rfl: 1   cyclobenzaprine (FLEXERIL) 10 MG tablet, Take 1 tablet (10 mg total) by mouth 3 (three) times daily as needed for muscle spasms., Disp: 90 tablet, Rfl: 3   diclofenac sodium (VOLTAREN) 1 % GEL, Apply 2 g topically 4 (four) times daily., Disp: 2 Tube, Rfl: 2   DULoxetine (CYMBALTA) 60 MG capsule, TAKE 1 CAPSULE BY MOUTH EVERY DAY, Disp: 90 capsule, Rfl: 1   HYDROcodone bit-homatropine (HYCODAN) 5-1.5 MG/5ML syrup, Take 5  mLs by mouth every 12 (twelve) hours as needed for up to 10 days for cough., Disp: 100 mL, Rfl: 0   insulin glargine, 2 Unit Dial, (TOUJEO MAX SOLOSTAR) 300 UNIT/ML Solostar Pen, INJECT 16 UNITS INTO THE SKIN DAILY., Disp: 36 mL, Rfl: 1   insulin lispro (HUMALOG KWIKPEN) 100 UNIT/ML KwikPen, 5 units three times daily as needed for blood sugar > 200, Disp: 15 mL, Rfl: 11   Insulin Pen Needle 32G X 6 MM MISC, 1 each by Does not apply route daily., Disp: 100 each, Rfl: 2   Multiple Vitamin (MULTIVITAMIN) capsule, Take 1 capsule by mouth daily., Disp: , Rfl:    nirmatrelvir/ritonavir (PAXLOVID) 20 x 150 MG & 10 x 100MG  TABS, Take 3 tablets by mouth 2 (two) times daily for 5 days. (Take nirmatrelvir 150 mg two tablets twice daily for 5 days and ritonavir 100 mg one tablet twice daily for 5 days) Patient GFR is 78, Disp: 30 tablet, Rfl: 0   Olopatadine HCl 0.2 % SOLN, Apply to eye as needed., Disp: , Rfl:    ondansetron (ZOFRAN ODT) 4 MG disintegrating tablet, Take 1 tablet (4 mg total) by mouth every 8 (eight) hours as needed for nausea or vomiting., Disp: 20 tablet, Rfl: 0   predniSONE (DELTASONE) 20 MG tablet, Take 2 tablets (40 mg total) by mouth daily with breakfast for 5 days., Disp: 10 tablet, Rfl: 0   Risankizumab-rzaa (SKYRIZI) 150 MG/ML SOSY, Inject 150 mg into the skin as needed., Disp: , Rfl:    Semaglutide,0.25 or 0.5MG /DOS, (OZEMPIC, 0.25 OR 0.5 MG/DOSE,) 2 MG/3ML SOPN, INJECT 0.5  MG INTO THE SKIN ONE TIME PER WEEK, Disp: 3 mL, Rfl: 1   simvastatin (ZOCOR) 40 MG tablet, Take 1 tablet (40 mg total) by mouth every other day., Disp: 90 tablet, Rfl: 2   traZODone (DESYREL) 100 MG tablet, TAKE 1 TABLET EVERY NIGHT AT BEDTIME - GENERIC FOR DESYREL, Disp: 90 tablet, Rfl: 0   Triamcinolone Acetonide (TRIAMCINOLONE 0.1 % CREAM : EUCERIN) CREA, Apply 1 application topically 2 (two) times daily., Disp: 1 each, Rfl: 0   valACYclovir (VALTREX) 500 MG tablet, Take 1 tablet (500 mg total) by mouth 2 (two) times daily., Disp: 180 tablet, Rfl: 3  EXAM:  VITALS per patient if applicable:Temp (!) 101.2 F (38.4 C) (Oral) Comment: pt report  Ht 5' 4.5" (1.638 m)   BMI 22.14 kg/m   GENERAL: alert, oriented, appears well and in no acute distress  HEENT: atraumatic, conjunctiva clear, no obvious abnormalities on inspection of external nose and ears  NECK: normal movements of the head and neck  LUNGS: on inspection no signs of respiratory distress, breathing rate appears normal, no obvious gross SOB, gasping or wheezing. Non productive cough a few times during visit.  CV: no obvious cyanosis  MS: moves all visible extremities without noticeable abnormality  PSYCH/NEURO: pleasant and cooperative, no obvious depression or anxiety, speech and thought processing grossly intact  ASSESSMENT AND PLAN:  Discussed the following assessment and plan:  COVID-19 virus infection We discussed Dx,possible complications and treatment options. She has a moderate case with risk for complications. We discussed oral antiviral options and side effects. She agrees with trying Paxlovid.. Symptomatic treatment with plenty of fluids,rest,tylenol 500 mg 3-4 times per day prn. She is not taking Simvastatin or other statin. Explained that cough and congestion may last a few more days and even weeks after acute symptoms have resolved. Clearly instructed about warning signs.  -  nirmatrelvir/ritonavir;  Take 3 tablets by mouth 2 (two) times daily for 5 days. (Take nirmatrelvir 150 mg two tablets twice daily for 5 days and ritonavir 100 mg one tablet twice daily for 5 days) Patient GFR is 78  Dispense: 30 tablet; Refill: 0 -     MYCHART COVID-19 HOME MONITORING PROGRAM; Future  Mild intermittent asthma, unspecified whether complicated She has not noted wheezing but reported chest tightness and some difficulty breathing, which could be indicative of asthma exacerbation. On video she is not in acute respiratory distress. Recommend prednisone 40 mg daily for 3 to 5 days with breakfast, side effects discussed. Symbicort 40-4.5 mcg 1 to 2 puff 3 times per day for a week then continue twice daily. Instructed about warning signs.  -     predniSONE; Take 2 tablets (40 mg total) by mouth daily with breakfast for 5 days.  Dispense: 10 tablet; Refill: 0  Acute cough Adequate hydration. Hycodan for cough management, recommend taking it mainly at bedtime. If cough becomes more productive, she can try plain Mucinex.  -     HYDROcodone Bit-Homatrop MBr; Take 5 mLs by mouth every 12 (twelve) hours as needed for up to 10 days for cough.  Dispense: 100 mL; Refill: 0  We discussed possible serious and likely etiologies, options for evaluation and workup, limitations of telemedicine visit vs in person visit, treatment, treatment risks and precautions. The patient was advised to call back or seek an in-person evaluation if the symptoms worsen or if the condition fails to improve as anticipated. I discussed the assessment and treatment plan with the patient. The patient was provided an opportunity to ask questions and all were answered. The patient agreed with the plan and demonstrated an understanding of the instructions.  Return if symptoms worsen or fail to improve.  Hassen Bruun G. [place], MD  [facility]. [place] office.

## 2022-12-29 ENCOUNTER — Ambulatory Visit: Payer: Medicare Other | Admitting: Family Medicine

## 2023-02-01 DIAGNOSIS — Z794 Long term (current) use of insulin: Secondary | ICD-10-CM | POA: Diagnosis not present

## 2023-02-01 DIAGNOSIS — E1365 Other specified diabetes mellitus with hyperglycemia: Secondary | ICD-10-CM | POA: Diagnosis not present

## 2023-02-09 ENCOUNTER — Ambulatory Visit (INDEPENDENT_AMBULATORY_CARE_PROVIDER_SITE_OTHER): Payer: Medicare Other

## 2023-02-09 VITALS — Wt 131.0 lb

## 2023-02-09 DIAGNOSIS — Z0000 Encounter for general adult medical examination without abnormal findings: Secondary | ICD-10-CM

## 2023-02-09 DIAGNOSIS — Z1231 Encounter for screening mammogram for malignant neoplasm of breast: Secondary | ICD-10-CM | POA: Diagnosis not present

## 2023-02-09 NOTE — Progress Notes (Signed)
Subjective:   Rhonda Farley is a 72 y.o. female who presents for Medicare Annual (Subsequent) preventive examination.  Visit Complete: Virtual I connected with  Rhonda Farley on 02/09/23 by a audio enabled telemedicine application and verified that I am speaking with the correct person using two identifiers.  Patient Location: Home  Provider Location: Office/Clinic  I discussed the limitations of evaluation and management by telemedicine. The patient expressed understanding and agreed to proceed.  Vital Signs: Because this visit was a virtual/telehealth visit, some criteria may be missing or patient reported. Any vitals not documented were not able to be obtained and vitals that have been documented are patient reported.  Cardiac Risk Factors include: advanced age (>30men, >34 women);diabetes mellitus;dyslipidemia;hypertension     Objective:    Today's Vitals   02/09/23 0959  Weight: 131 lb (59.4 kg)   Body mass index is 22.14 kg/m.     02/09/2023   10:03 AM 01/26/2022   11:42 AM 01/13/2021   11:26 AM  Advanced Directives  Does Patient Have a Medical Advance Directive? Yes Yes Yes  Type of Estate agent of [place];Living will Healthcare Power of [place];Living will Healthcare Power of [place];Living will  Copy of Healthcare Power of Attorney in Chart? No - copy requested No - copy requested No - copy requested    Current Medications (verified) Outpatient Encounter Medications as of 02/09/2023  Medication Sig   azelastine (ASTELIN) 0.1 % nasal spray Place 2 sprays into both nostrils 2 (two) times daily.   B Complex-C (SUPER B COMPLEX PO) Take by mouth daily.   budesonide-formoterol (SYMBICORT) 80-4.5 MCG/ACT inhaler Inhale 2 puffs into the lungs as needed.   buPROPion (WELLBUTRIN XL) 150 MG 24 hr tablet TAKE 1 TABLET BY MOUTH EVERY DAY IN THE MORNING   butalbital-acetaminophen-caffeine (FIORICET) 50-325-40 MG tablet Take 2 tablets by mouth as  needed.   CALCIUM ACETATE-MAGNESIUM CARB PO Take by mouth. Vit d 3   cetirizine (ZYRTEC) 10 MG tablet Take 10 mg by mouth daily.   Continuous Blood Gluc Receiver (FREESTYLE LIBRE READER) DEVI Use up to 4 times daily to check blood sugar.   Continuous Glucose Sensor (FREESTYLE LIBRE 2 SENSOR) MISC USE AS DIRECTED E11.9   cyanocobalamin (VITAMIN B12) 1000 MCG/ML injection INJECT 1 ML INTO THE MUSCLE EVERY 30 DAYS. PT MAY SELF ADMINSTER   cyclobenzaprine (FLEXERIL) 10 MG tablet Take 1 tablet (10 mg total) by mouth 3 (three) times daily as needed for muscle spasms.   diclofenac sodium (VOLTAREN) 1 % GEL Apply 2 g topically 4 (four) times daily.   DULoxetine (CYMBALTA) 60 MG capsule TAKE 1 CAPSULE BY MOUTH EVERY DAY   insulin glargine, 2 Unit Dial, (TOUJEO MAX SOLOSTAR) 300 UNIT/ML Solostar Pen INJECT 16 UNITS INTO THE SKIN DAILY.   insulin lispro (HUMALOG KWIKPEN) 100 UNIT/ML KwikPen 5 units three times daily as needed for blood sugar > 200   Insulin Pen Needle 32G X 6 MM MISC 1 each by Does not apply route daily.   Multiple Vitamin (MULTIVITAMIN) capsule Take 1 capsule by mouth daily.   Olopatadine HCl 0.2 % SOLN Apply to eye as needed.   ondansetron (ZOFRAN ODT) 4 MG disintegrating tablet Take 1 tablet (4 mg total) by mouth every 8 (eight) hours as needed for nausea or vomiting.   Risankizumab-rzaa (SKYRIZI) 150 MG/ML SOSY Inject 150 mg into the skin as needed.   Semaglutide,0.25 or 0.5MG /DOS, (OZEMPIC, 0.25 OR 0.5 MG/DOSE,) 2 MG/3ML SOPN INJECT 0.5 MG  INTO THE SKIN ONE TIME PER WEEK   SHINGRIX injection    simvastatin (ZOCOR) 40 MG tablet Take 1 tablet (40 mg total) by mouth every other day.   traZODone (DESYREL) 100 MG tablet TAKE 1 TABLET EVERY NIGHT AT BEDTIME - GENERIC FOR DESYREL   Triamcinolone Acetonide (TRIAMCINOLONE 0.1 % CREAM : EUCERIN) CREA Apply 1 application topically 2 (two) times daily.   valACYclovir (VALTREX) 500 MG tablet Take 1 tablet (500 mg total) by mouth 2 (two) times  daily.   No facility-administered encounter medications on file as of 02/09/2023.    Allergies (verified) Citrullus vulgaris   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    Chronic headaches    Depression    Diabetes mellitus    Fibromyalgia    GERD (gastroesophageal reflux disease)    Hyperlipidemia    Hypertension    no per patient   Migraines    Obesity    Pneumonia    Shingles    Tachycardia    Thyroid disease    Past Surgical History:  Procedure Laterality Date   ABDOMINAL EXPLORATION SURGERY     BUNIONECTOMY     CERVICAL LAMINECTOMY  2000   ROTATOR CUFF REPAIR  01/2010   right   ROUX-EN-Y PROCEDURE  2009   TONSILLECTOMY     Family History  Problem Relation Age of Onset   Arthritis Mother    Cancer Mother        colon/skin   Colon cancer Mother    Cancer Father        lung/prostate   Diabetes Paternal Uncle    Colon cancer Paternal Uncle    Arthritis Paternal Grandmother    Heart disease Paternal Grandmother    Arthritis Paternal Grandfather    Cancer Paternal Grandfather        prostate   Esophageal cancer Maternal Aunt    Social History   Socioeconomic History   Marital status: Widowed    Spouse name: Not on file   Number of children: Not on file   Years of education: Not on file   Highest education level: Bachelor's degree (e.g., BA, AB, BS)  Occupational History   Not on file  Tobacco Use   Smoking status: Former    Current packs/day: 0.00    Types: Cigarettes    Quit date: 04/13/1977    Years since quitting: 45.8   Smokeless tobacco: Never  Vaping Use   Vaping status: Never Used  Substance and Sexual Activity   Alcohol use: No   Drug use: No   Sexual activity: Not on file  Other Topics Concern   Not on file  Social History Narrative   Not on file   Social Determinants of Health   Financial Resource Strain: Low  Risk  (02/09/2023)   Overall Financial Resource Strain (CARDIA)    Difficulty of Paying Living Expenses: Not hard at all  Food Insecurity: No Food Insecurity (02/09/2023)   Hunger Vital Sign    Worried About Running Out of Food in the Last Year: Never true    Ran Out of Food in the Last Year: Never true  Transportation Needs: No Transportation Needs (02/09/2023)   PRAPARE - Administrator, Civil Service (Medical): No    Lack of Transportation (Non-Medical): No  Physical Activity:  Sufficiently Active (02/09/2023)   Exercise Vital Sign    Days of Exercise per Week: 5 days    Minutes of Exercise per Session: 30 min  Stress: No Stress Concern Present (02/09/2023)   [employer] of Occupational Health - Occupational Stress Questionnaire    Feeling of Stress : Not at all  Social Connections: Moderately Integrated (02/09/2023)   Social Connection and Isolation Panel [NHANES]    Frequency of Communication with Friends and Family: More than three times a week    Frequency of Social Gatherings with Friends and Family: More than three times a week    Attends Religious Services: More than 4 times per year    Active Member of [employer] or Organizations: Yes    Attends Banker Meetings: 1 to 4 times per year    Marital Status: Widowed    Tobacco Counseling Counseling given: Not Answered   Clinical Intake:  Pre-visit preparation completed: Yes  Pain : No/denies pain     BMI - recorded: 22.14 Nutritional Status: BMI of 19-24  Normal Nutritional Risks: None Diabetes: Yes CBG done?: Yes (130 per pt) CBG resulted in Enter/ Edit results?: No Did pt. bring in CBG monitor from home?: No  How often do you need to have someone help you when you read instructions, pamphlets, or other written materials from your doctor or pharmacy?: 1 - Never  Interpreter Needed?: No  Information entered by :: Lanier Ensign, LPN   Activities of Daily Living    02/09/2023    10:05 AM  In your present state of health, do you have any difficulty performing the following activities:  Hearing? 1  Comment HOH  Vision? 0  Difficulty concentrating or making decisions? 0  Walking or climbing stairs? 0  Dressing or bathing? 0  Doing errands, shopping? 0  Preparing Food and eating ? N  Using the Toilet? N  In the past six months, have you accidently leaked urine? N  Do you have problems with loss of bowel control? N  Managing your Medications? N  Managing your Finances? N  Housekeeping or managing your Housekeeping? N    Patient Care Team: Ardith Dark, MD as PCP - General (Family Medicine)  Indicate any recent Medical Services you may have received from other than [facility] providers in the past year (date may be approximate).     Assessment:   This is a routine wellness examination for [facility].  Hearing/Vision screen Hearing Screening - Comments:: Pt stated HOH Vision Screening - Comments:: Pt follows up with Dr Emily Filbert for annual eye exams    Goals Addressed             This Visit's Progress    Patient Stated       Maintain health and activity        Depression Screen    02/09/2023   10:02 AM 09/14/2022    4:05 PM 08/24/2022    1:30 PM 03/17/2022    1:26 PM 01/26/2022   11:40 AM 03/13/2021    1:22 PM 01/13/2021   11:25 AM  PHQ 2/9 Scores  PHQ - 2 Score 0 0 0 0 0 0 0  PHQ- 9 Score  4 0   9     Fall Risk    02/09/2023   10:04 AM 09/14/2022    4:05 PM 03/17/2022    1:26 PM 01/26/2022   11:43 AM 03/13/2021    1:23 PM  Fall Risk   Falls in the  past year? 1 1 1 1 1   Number falls in past yr: 0 0 1 1 1   Injury with Fall? 0 0 0 0 0  Risk for fall due to : History of fall(s) No Fall Risks Impaired balance/gait Impaired vision   Follow up Falls prevention discussed Falls evaluation completed  Falls prevention discussed     MEDICARE RISK AT HOME: Medicare Risk at Home Any stairs in or around the home?: Yes If so, are there any without handrails?:  No Home free of loose throw rugs in walkways, pet beds, electrical cords, etc?: Yes Adequate lighting in your home to reduce risk of falls?: Yes Life alert?: No Use of a cane, walker or w/c?: No Grab bars in the bathroom?: Yes Shower chair or bench in shower?: Yes Elevated toilet seat or a handicapped toilet?: No  TIMED UP AND GO:  Was the test performed?  No    Cognitive Function:        02/09/2023   10:05 AM 01/26/2022   11:43 AM 01/13/2021   11:28 AM  6CIT Screen  What Year? 0 points 0 points 0 points  What month? 0 points 0 points 0 points  What time? 0 points 0 points 0 points  Count back from 20 0 points 0 points 0 points  Months in reverse 0 points 0 points 0 points  Repeat phrase 0 points 0 points 0 points  Total Score 0 points 0 points 0 points    Immunizations Immunization History  Administered Date(s) Administered   Fluad Quad(high Dose 65+) 03/03/2022   Influenza Inj Mdck Quad Pf 04/16/2020   Influenza, High Dose Seasonal PF 01/31/2018   Influenza, Seasonal, Injecte, Preservative Fre 03/18/2012   Influenza,inj,Quad PF,6+ Mos 01/10/2013, 04/08/2016, 05/06/2017   Influenza-Unspecified 12/12/2020   PFIZER(Purple Top)SARS-COV-2 Vaccination 06/11/2019, 07/11/2019, 04/16/2020   PNEUMOCOCCAL CONJUGATE-20 02/24/2022   Pneumococcal Polysaccharide-23 04/13/2002, 05/06/2017   Td 04/13/1994   Tdap 01/10/2013, 05/06/2017   Zoster Recombinant(Shingrix) 06/23/2022, 12/08/2022    TDAP status: Up to date  Flu Vaccine status: Due, Education has been provided regarding the importance of this vaccine. Advised may receive this vaccine at local pharmacy or Health Dept. Aware to provide a copy of the vaccination record if obtained from local pharmacy or Health Dept. Verbalized acceptance and understanding.  Pneumococcal vaccine status: Up to date  Covid-19 vaccine status: Declined, Education has been provided regarding the importance of this vaccine but patient still  declined. Advised may receive this vaccine at local pharmacy or Health Dept.or vaccine clinic. Aware to provide a copy of the vaccination record if obtained from local pharmacy or Health Dept. Verbalized acceptance and understanding.  Qualifies for Shingles Vaccine? Yes   Zostavax completed Yes   Shingrix Completed?: Yes  Screening Tests Health Maintenance  Topic Date Due   FOOT EXAM  07/01/2012   INFLUENZA VACCINE  07/12/2023 (Originally 11/12/2022)   Hepatitis C Screening  08/24/2023 (Originally 11/20/1968)   HEMOGLOBIN A1C  02/24/2023   Diabetic kidney evaluation - Urine ACR  03/18/2023   Diabetic kidney evaluation - eGFR measurement  08/24/2023   OPHTHALMOLOGY EXAM  11/18/2023   MAMMOGRAM  02/04/2024   Medicare Annual Wellness (AWV)  02/09/2024   Colonoscopy  08/24/2024   DTaP/Tdap/Td (4 - Td or Tdap) 05/07/2027   Pneumonia Vaccine 53+ Years old  Completed   DEXA SCAN  Completed   Zoster Vaccines- Shingrix  Completed   HPV VACCINES  Aged Out   COVID-19 Vaccine  Discontinued  Health Maintenance  Health Maintenance Due  Topic Date Due   FOOT EXAM  07/01/2012    Colorectal cancer screening: Type of screening: Colonoscopy. Completed 08/25/22. Repeat every 2 years  Mammogram status: Ordered 02/09/23. Pt provided with contact info and advised to call to schedule appt.   Bone Density status: Completed 09/24/09. Results reflect: Bone density results: NORMAL. Repeat every 2 years.   Additional Screening:  Hepatitis C Screening: does qualify  Vision Screening: Recommended annual ophthalmology exams for early detection of glaucoma and other disorders of the eye. Is the patient up to date with their annual eye exam?  Yes  Who is the provider or what is the name of the office in which the patient attends annual eye exams? Dr Emily Filbert  If pt is not established with a provider, would they like to be referred to a provider to establish care? No .   Dental Screening: Recommended annual  dental exams for proper oral hygiene  Diabetic Foot Exam: Diabetic Foot Exam: Overdue, Pt has been advised about the importance in completing this exam. Pt is scheduled for diabetic foot exam on next appt .  Community Resource Referral / Chronic Care Management: CRR required this visit?  No   CCM required this visit?  No     Plan:     I have personally reviewed and noted the following in the patient's chart:   Medical and social history Use of alcohol, tobacco or illicit drugs  Current medications and supplements including opioid prescriptions. Patient is currently taking opioid prescriptions. Information provided to patient regarding non-opioid alternatives. Patient advised to discuss non-opioid treatment plan with their provider. Functional ability and status Nutritional status Physical activity Advanced directives List of other physicians Hospitalizations, surgeries, and ER visits in previous 12 months Vitals Screenings to include cognitive, depression, and falls Referrals and appointments  In addition, I have reviewed and discussed with patient certain preventive protocols, quality metrics, and best practice recommendations. A written personalized care plan for preventive services as well as general preventive health recommendations were provided to patient.     Marzella Schlein, LPN   09/32/3557   After Visit Summary: (MyChart) Due to this being a telephonic visit, the after visit summary with patients personalized plan was offered to patient via MyChart   Nurse Notes: none

## 2023-02-09 NOTE — Patient Instructions (Signed)
Rhonda Farley , Thank you for taking time to come for your Medicare Wellness Visit. I appreciate your ongoing commitment to your health goals. Please review the following plan we discussed and let me know if I can assist you in the future.   Referrals/Orders/Follow-Ups/Clinician Recommendations: maintain health and activity   [facility] [address] [place] [address] [phone]  This is a list of the screening recommended for you and due dates:  Health Maintenance  Topic Date Due   Complete foot exam   07/01/2012   Flu Shot  07/12/2023*   Hepatitis C Screening  08/24/2023*   Hemoglobin A1C  02/24/2023   Yearly kidney health urinalysis for diabetes  03/18/2023   Yearly kidney function blood test for diabetes  08/24/2023   Eye exam for diabetics  11/18/2023   Mammogram  02/04/2024   Medicare Annual Wellness Visit  02/09/2024   Colon Cancer Screening  08/24/2024   DTaP/Tdap/Td vaccine (4 - Td or Tdap) 05/07/2027   Pneumonia Vaccine  Completed   DEXA scan (bone density measurement)  Completed   Zoster (Shingles) Vaccine  Completed   HPV Vaccine  Aged Out   COVID-19 Vaccine  Discontinued  *Topic was postponed. The date shown is not the original due date.    Advanced directives: (Copy Requested) Please bring a copy of your health care power of attorney and living will to the office to be added to your chart at your convenience.  Next Medicare Annual Wellness Visit scheduled for next year: Yes

## 2023-02-12 ENCOUNTER — Other Ambulatory Visit: Payer: Self-pay | Admitting: Family Medicine

## 2023-02-15 DIAGNOSIS — E1365 Other specified diabetes mellitus with hyperglycemia: Secondary | ICD-10-CM | POA: Diagnosis not present

## 2023-02-15 DIAGNOSIS — Z794 Long term (current) use of insulin: Secondary | ICD-10-CM | POA: Diagnosis not present

## 2023-02-24 ENCOUNTER — Other Ambulatory Visit: Payer: Self-pay | Admitting: Family Medicine

## 2023-02-24 NOTE — Telephone Encounter (Signed)
Please advise 

## 2023-02-25 MED ORDER — TRAZODONE HCL 100 MG PO TABS: ORAL_TABLET | ORAL | 0 refills | Status: DC

## 2023-04-15 ENCOUNTER — Other Ambulatory Visit: Payer: Self-pay | Admitting: Family Medicine

## 2023-04-23 ENCOUNTER — Other Ambulatory Visit: Payer: Self-pay | Admitting: Family Medicine

## 2023-05-18 ENCOUNTER — Encounter: Payer: Self-pay | Admitting: Family Medicine

## 2023-05-18 ENCOUNTER — Ambulatory Visit (INDEPENDENT_AMBULATORY_CARE_PROVIDER_SITE_OTHER): Payer: Medicare Other | Admitting: Family Medicine

## 2023-05-18 VITALS — BP 117/78 | HR 97 | Temp 98.3°F | Ht 64.5 in | Wt 134.2 lb

## 2023-05-18 DIAGNOSIS — Z7984 Long term (current) use of oral hypoglycemic drugs: Secondary | ICD-10-CM | POA: Diagnosis not present

## 2023-05-18 DIAGNOSIS — E119 Type 2 diabetes mellitus without complications: Secondary | ICD-10-CM

## 2023-05-18 DIAGNOSIS — J329 Chronic sinusitis, unspecified: Secondary | ICD-10-CM

## 2023-05-18 DIAGNOSIS — G43809 Other migraine, not intractable, without status migrainosus: Secondary | ICD-10-CM | POA: Diagnosis not present

## 2023-05-18 DIAGNOSIS — Z7985 Long-term (current) use of injectable non-insulin antidiabetic drugs: Secondary | ICD-10-CM | POA: Diagnosis not present

## 2023-05-18 LAB — POCT GLYCOSYLATED HEMOGLOBIN (HGB A1C): Hemoglobin A1C: 6.7 % — AB (ref 4.0–5.6)

## 2023-05-18 LAB — MICROALBUMIN / CREATININE URINE RATIO
Creatinine,U: 97.5 mg/dL
Microalb Creat Ratio: 0.8 mg/g (ref 0.0–30.0)
Microalb, Ur: 0.8 mg/dL (ref 0.0–1.9)

## 2023-05-18 MED ORDER — BUTALBITAL-APAP-CAFFEINE 50-325-40 MG PO TABS: 2.0000 | ORAL_TABLET | ORAL | 3 refills | Status: DC | PRN

## 2023-05-18 MED ORDER — AMOXICILLIN-POT CLAVULANATE 875-125 MG PO TABS: 1.0000 | ORAL_TABLET | Freq: Two times a day (BID) | ORAL | 0 refills | Status: DC

## 2023-05-18 NOTE — Assessment & Plan Note (Signed)
Stable on Fioricet and naratriptan as needed.  Will refill her Fioricet today.

## 2023-05-18 NOTE — Patient Instructions (Addendum)
 It was very nice to see you today!  We will refill your for your Fioricet.  I think you probably have a sinus infection.  Please start the Augmentin .  Let us  know if not improving in the next few weeks.  I will see you back in 6 months for your annual checkup with labs.  Come back sooner if needed.  Return in about 6 months (around 11/15/2023) for Annual Physical.   Take care, Dr Kennyth  PLEASE NOTE:  If you had any lab tests, please let us  know if you have not heard back within a few days. You may see your results on mychart before we have a chance to review them but we will give you a call once they are reviewed by us .   If we ordered any referrals today, please let us  know if you have not heard from their office within the next week.   If you had any urgent prescriptions sent in today, please check with the pharmacy within an hour of our visit to make sure the prescription was transmitted appropriately.   Please try these tips to maintain a healthy lifestyle:  Eat at least 3 REAL meals and 1-2 snacks per day.  Aim for no more than 5 hours between eating.  If you eat breakfast, please do so within one hour of getting up.   Each meal should contain half fruits/vegetables, one quarter protein, and one quarter carbs (no bigger than a computer mouse)  Cut down on sweet beverages. This includes juice, soda, and sweet tea.   Drink at least 1 glass of water with each meal and aim for at least 8 glasses per day  Exercise at least 150 minutes every week.

## 2023-05-18 NOTE — Progress Notes (Signed)
   Rhonda Farley is a 73 y.o. female who presents today for an office visit.  Assessment/Plan:  New/Acute Problems: Sinusitis  No red flags.  Given length of symptoms we will start Augmentin .  She has done well with this in the past.  She can continue over-the-counter meds as needed.  Encouraged hydration.  Discussed reasons to return to care.  Follow-up as needed.  Chronic Problems Addressed Today: Type 2 diabetes mellitus without complication ([name]) She lives with an endocrinologist since her last visit.  A1c today is 6.7 on Actos 15 mg daily and Ozempic  0.5 mg weekly.  She will see endocrinology next week.  Advised her she can come back to us  for management for diabetes if she wishes or she can continue seeing the endocrinologist.  We will see her back in 6 months for CPE.  Migraine Stable on Fioricet and naratriptan  as needed.  Will refill her Fioricet today.    Subjective:  HPI:  See Assessment / plan for status of chronic conditions. Since our last visit she did see an endocrinologist and they stopped her tujeo and started her on actos. She has done with this transition.  She will be seeing them again next week.  She is not sure if she would like to continue to see them for having us  take back over management for her diabetes.  She has been having sinus congestion the last couple of weeks. She is trying over the counter medications without much improvement. No known sick contacts.  No fever or chills.  Occasional dizziness.  No ear pain.       Objective:  Physical Exam: BP 117/78 (BP Location: Left Arm, Patient Position: Sitting, Cuff Size: Normal)   Pulse 97   Temp 98.3 F (36.8 C) (Temporal)   Ht 5' 4.5 (1.638 m)   Wt 134 lb 3.2 oz (60.9 kg)   SpO2 97%   BMI 22.68 kg/m   Gen: No acute distress, resting comfortably HEENT: TMs clear.  OP erythematous.  Nasal mucosa erythematous and boggy bilaterally. CV: Regular rate and rhythm with no murmurs appreciated Pulm: Normal  work of breathing, clear to auscultation bilaterally with no crackles, wheezes, or rhonchi Neuro: Grossly normal, moves all extremities Psych: Normal affect and thought content      Aaliyana Fredericks M. Kennyth, MD 05/18/2023 1:26 PM

## 2023-05-18 NOTE — Assessment & Plan Note (Signed)
 She lives with an endocrinologist since her last visit.  A1c today is 6.7 on Actos 15 mg daily and Ozempic  0.5 mg weekly.  She will see endocrinology next week.  Advised her she can come back to us  for management for diabetes if she wishes or she can continue seeing the endocrinologist.  We will see her back in 6 months for CPE.

## 2023-05-20 ENCOUNTER — Encounter: Payer: Self-pay | Admitting: Family Medicine

## 2023-05-20 NOTE — Progress Notes (Signed)
 Urine sample is normal. We can recheck in a year.  Rhonda Farley. Daneil Dunker, MD 05/20/2023 1:20 PM

## 2023-05-24 ENCOUNTER — Other Ambulatory Visit: Payer: Self-pay | Admitting: Family Medicine

## 2023-05-24 DIAGNOSIS — Z7984 Long term (current) use of oral hypoglycemic drugs: Secondary | ICD-10-CM | POA: Diagnosis not present

## 2023-05-24 DIAGNOSIS — E139 Other specified diabetes mellitus without complications: Secondary | ICD-10-CM | POA: Diagnosis not present

## 2023-05-24 DIAGNOSIS — E1365 Other specified diabetes mellitus with hyperglycemia: Secondary | ICD-10-CM | POA: Diagnosis not present

## 2023-05-24 DIAGNOSIS — Z794 Long term (current) use of insulin: Secondary | ICD-10-CM | POA: Diagnosis not present

## 2023-07-14 DIAGNOSIS — D2272 Melanocytic nevi of left lower limb, including hip: Secondary | ICD-10-CM | POA: Diagnosis not present

## 2023-07-14 DIAGNOSIS — D1801 Hemangioma of skin and subcutaneous tissue: Secondary | ICD-10-CM | POA: Diagnosis not present

## 2023-07-14 DIAGNOSIS — L400 Psoriasis vulgaris: Secondary | ICD-10-CM | POA: Diagnosis not present

## 2023-07-14 DIAGNOSIS — D2261 Melanocytic nevi of right upper limb, including shoulder: Secondary | ICD-10-CM | POA: Diagnosis not present

## 2023-07-14 DIAGNOSIS — D2262 Melanocytic nevi of left upper limb, including shoulder: Secondary | ICD-10-CM | POA: Diagnosis not present

## 2023-07-14 DIAGNOSIS — L812 Freckles: Secondary | ICD-10-CM | POA: Diagnosis not present

## 2023-07-14 DIAGNOSIS — L821 Other seborrheic keratosis: Secondary | ICD-10-CM | POA: Diagnosis not present

## 2023-07-14 DIAGNOSIS — D2271 Melanocytic nevi of right lower limb, including hip: Secondary | ICD-10-CM | POA: Diagnosis not present

## 2023-07-14 DIAGNOSIS — D225 Melanocytic nevi of trunk: Secondary | ICD-10-CM | POA: Diagnosis not present

## 2023-07-23 ENCOUNTER — Other Ambulatory Visit: Payer: Self-pay | Admitting: Family Medicine

## 2023-08-20 ENCOUNTER — Other Ambulatory Visit: Payer: Self-pay | Admitting: Family Medicine

## 2023-09-24 ENCOUNTER — Other Ambulatory Visit: Payer: Self-pay | Admitting: Family Medicine

## 2023-10-12 DIAGNOSIS — Z713 Dietary counseling and surveillance: Secondary | ICD-10-CM | POA: Diagnosis not present

## 2023-10-12 DIAGNOSIS — E139 Other specified diabetes mellitus without complications: Secondary | ICD-10-CM | POA: Diagnosis not present

## 2023-10-12 DIAGNOSIS — Z7984 Long term (current) use of oral hypoglycemic drugs: Secondary | ICD-10-CM | POA: Diagnosis not present

## 2023-10-16 ENCOUNTER — Other Ambulatory Visit: Payer: Self-pay | Admitting: Family Medicine

## 2023-11-12 DIAGNOSIS — E138 Other specified diabetes mellitus with unspecified complications: Secondary | ICD-10-CM | POA: Diagnosis not present

## 2023-11-16 ENCOUNTER — Ambulatory Visit: Payer: Medicare Other | Admitting: Family Medicine

## 2023-11-16 ENCOUNTER — Encounter: Payer: Self-pay | Admitting: Family Medicine

## 2023-11-16 VITALS — BP 105/70 | HR 77 | Temp 97.0°F | Ht 64.5 in | Wt 132.6 lb

## 2023-11-16 DIAGNOSIS — E611 Iron deficiency: Secondary | ICD-10-CM

## 2023-11-16 DIAGNOSIS — E785 Hyperlipidemia, unspecified: Secondary | ICD-10-CM

## 2023-11-16 DIAGNOSIS — E1149 Type 2 diabetes mellitus with other diabetic neurological complication: Secondary | ICD-10-CM

## 2023-11-16 DIAGNOSIS — F5101 Primary insomnia: Secondary | ICD-10-CM | POA: Diagnosis not present

## 2023-11-16 DIAGNOSIS — G43809 Other migraine, not intractable, without status migrainosus: Secondary | ICD-10-CM | POA: Diagnosis not present

## 2023-11-16 DIAGNOSIS — Z7985 Long-term (current) use of injectable non-insulin antidiabetic drugs: Secondary | ICD-10-CM | POA: Diagnosis not present

## 2023-11-16 DIAGNOSIS — R110 Nausea: Secondary | ICD-10-CM | POA: Diagnosis not present

## 2023-11-16 DIAGNOSIS — E1169 Type 2 diabetes mellitus with other specified complication: Secondary | ICD-10-CM

## 2023-11-16 DIAGNOSIS — E538 Deficiency of other specified B group vitamins: Secondary | ICD-10-CM | POA: Diagnosis not present

## 2023-11-16 DIAGNOSIS — E119 Type 2 diabetes mellitus without complications: Secondary | ICD-10-CM

## 2023-11-16 DIAGNOSIS — F419 Anxiety disorder, unspecified: Secondary | ICD-10-CM

## 2023-11-16 DIAGNOSIS — F325 Major depressive disorder, single episode, in full remission: Secondary | ICD-10-CM

## 2023-11-16 MED ORDER — TRAZODONE HCL 100 MG PO TABS: ORAL_TABLET | ORAL | 3 refills | Status: AC

## 2023-11-16 MED ORDER — ONDANSETRON 4 MG PO TBDP: 4.0000 mg | ORAL_TABLET | Freq: Three times a day (TID) | ORAL | Status: DC | PRN

## 2023-11-16 MED ORDER — BUTALBITAL-APAP-CAFFEINE 50-325-40 MG PO TABS: 2.0000 | ORAL_TABLET | ORAL | 3 refills | Status: AC | PRN

## 2023-11-16 NOTE — Patient Instructions (Signed)
 It was very nice to see you today!  VISIT SUMMARY:  Today, we reviewed your ongoing health concerns, including your migraines, diabetes, insomnia, and nausea. We discussed your current treatments and made some adjustments to your medications.  YOUR PLAN:  MIGRAINE HEADACHES: Your chronic migraines are often triggered by changes in barometric pressure. You currently manage them with Fioricet. -We have refilled your Fioricet prescription. -If your headaches occur more than twice a week, we may consider a preventive medication like Aimovig.  TYPE 2 DIABETES MELLITUS WITHOUT COMPLICATIONS: Your diabetes is well-controlled with a recent A1c of 6.5%. -Continue with your current diabetes management plan. -You have an appointment with your endocrinologist next week for further evaluation.  INSOMNIA: You experience intermittent insomnia and manage it with trazodone . -We have refilled your trazodone  prescription.  INTERMITTENT NAUSEA: You experience occasional nausea and manage it with Zofran . -We have refilled your Zofran  prescription.  Please come back tomorrow for labs.   Return in about 1 year (around 11/15/2024) for Follow Up.   Take care, Dr Kennyth  PLEASE NOTE:  If you had any lab tests, please let us  know if you have not heard back within a few days. You may see your results on mychart before we have a chance to review them but we will give you a call once they are reviewed by us .   If we ordered any referrals today, please let us  know if you have not heard from their office within the next week.   If you had any urgent prescriptions sent in today, please check with the pharmacy within an hour of our visit to make sure the prescription was transmitted appropriately.   Please try these tips to maintain a healthy lifestyle:  Eat at least 3 REAL meals and 1-2 snacks per day.  Aim for no more than 5 hours between eating.  If you eat breakfast, please do so within one hour of getting  up.   Each meal should contain half fruits/vegetables, one quarter protein, and one quarter carbs (no bigger than a computer mouse)  Cut down on sweet beverages. This includes juice, soda, and sweet tea.   Drink at least 1 glass of water with each meal and aim for at least 8 glasses per day  Exercise at least 150 minutes every week.

## 2023-11-16 NOTE — Assessment & Plan Note (Signed)
 Check iron panel with labs.

## 2023-11-16 NOTE — Assessment & Plan Note (Signed)
 Stable on Cymbalta 60 mg daily.

## 2023-11-16 NOTE — Assessment & Plan Note (Signed)
 Diabetes well-controlled per endocrinology.  She is currently on Ozempic  0.5 mg weekly.  She will follow-up with endocrinology next week for A1c.

## 2023-11-16 NOTE — Assessment & Plan Note (Signed)
 On simvastatin  40 mg every other day.  She will come back tomorrow to recheck labs.

## 2023-11-16 NOTE — Progress Notes (Signed)
 Rhonda Farley is a 73 y.o. female who presents today for an office visit.  Assessment/Plan:    Chronic Problems Addressed Today: Migraine She is on Fioricet and naratriptan  as needed.  Will refill her today.  She typically only uses as needed though has had to use more frequently recently due to exacerbation related to barometric pressure changes.  We did discuss trying to limit this to no more than 1-2 times per week.  We discussed preventative medication options including Aimovig or Emgality however she like to hold off on this for now.  She will let us  know if she needs persistent use of Fioricet or symptoms worsen noted would consider preventative medication at that point.  Type 2 diabetes mellitus without complication ([name]) Diabetes well-controlled per endocrinology.  She is currently on Ozempic  0.5 mg weekly.  She will follow-up with endocrinology next week for A1c.  Dyslipidemia associated with type 2 diabetes mellitus ([name]) On simvastatin  40 mg every other day.  She will come back tomorrow to recheck labs.  B12 deficiency s/p gastric bypass Check B12 with labs.  Anxiety Stable on Cymbalta  60 mg daily.  Depression, major, single episode, complete remission ([name]) Stable on 60 mg Cymbalta  daily.  Iron deficiency Check iron panel with labs.  Nausea Stable Zofran  as needed.  Primary insomnia Stable on trazodone .  Will refill today.  Preventative health care Check labs.  Up-to-date on vaccines and screenings.     Subjective:  HPI:  See A/P for status of chronic conditions.  Patient is here today for annual follow-up.  Discussed the use of AI scribe software for clinical note transcription with the patient, who gave verbal consent to proceed.  History of Present Illness Rhonda Farley is a 73 year old female who presents for her yearly follow-up.  She has a long-standing history of headaches, exacerbated by changes in barometric pressure. She uses Fioricet for  management, which she finds effective, but sometimes runs out of the medication during periods of frequent weather changes. She mentions using up to six tablets a day during severe episodes. She cannot take nonsteroidal anti-inflammatory drugs and finds Tylenol ineffective. She recalls being on a preventive medication for migraines in the past, which affected her blood pressure, but cannot remember the name of the medication or the exact time frame. She was referred to a neurologist at that time due to changes in her pupils.  She has a history of diabetes and reports doing well with her management. She has lost ten pounds and her last A1c was 6.5. She is scheduled to see her endocrinologist next week for further evaluation.  She is currently taking Trazodone  for sleep, sometimes requiring an additional dose if she remains awake after the initial dose. She also mentions needing a refill for Zofran , which she did not have on hand when needed recently.          Objective:  Physical Exam: BP 105/70   Pulse 77   Temp (!) 97 F (36.1 C) (Temporal)   Ht 5' 4.5 (1.638 m)   Wt 132 lb 9.6 oz (60.1 kg)   SpO2 99%   BMI 22.41 kg/m   Gen: No acute distress, resting comfortably CV: Regular rate and rhythm with no murmurs appreciated Pulm: Normal work of breathing, clear to auscultation bilaterally with no crackles, wheezes, or rhonchi Neuro: Grossly normal, moves all extremities Psych: Normal affect and thought content      Faiza Bansal M. Kennyth, MD 11/16/2023 11:52 AM

## 2023-11-16 NOTE — Assessment & Plan Note (Signed)
 Stable Zofran  as needed.

## 2023-11-16 NOTE — Assessment & Plan Note (Signed)
 Stable on trazodone .  Will refill today.

## 2023-11-16 NOTE — Assessment & Plan Note (Signed)
 Check B12 with labs.

## 2023-11-16 NOTE — Assessment & Plan Note (Signed)
 Stable on 60 mg Cymbalta  daily.

## 2023-11-16 NOTE — Assessment & Plan Note (Signed)
 She is on Fioricet and naratriptan  as needed.  Will refill her today.  She typically only uses as needed though has had to use more frequently recently due to exacerbation related to barometric pressure changes.  We did discuss trying to limit this to no more than 1-2 times per week.  We discussed preventative medication options including Aimovig or Emgality however she like to hold off on this for now.  She will let us  know if she needs persistent use of Fioricet or symptoms worsen noted would consider preventative medication at that point.

## 2023-11-17 ENCOUNTER — Other Ambulatory Visit: Payer: Self-pay | Admitting: *Deleted

## 2023-11-17 ENCOUNTER — Other Ambulatory Visit (INDEPENDENT_AMBULATORY_CARE_PROVIDER_SITE_OTHER)

## 2023-11-17 DIAGNOSIS — E785 Hyperlipidemia, unspecified: Secondary | ICD-10-CM

## 2023-11-17 DIAGNOSIS — E119 Type 2 diabetes mellitus without complications: Secondary | ICD-10-CM | POA: Diagnosis not present

## 2023-11-17 DIAGNOSIS — E611 Iron deficiency: Secondary | ICD-10-CM | POA: Diagnosis not present

## 2023-11-17 DIAGNOSIS — E538 Deficiency of other specified B group vitamins: Secondary | ICD-10-CM

## 2023-11-17 DIAGNOSIS — E1169 Type 2 diabetes mellitus with other specified complication: Secondary | ICD-10-CM

## 2023-11-17 LAB — LIPID PANEL
Cholesterol: 191 mg/dL (ref 0–200)
HDL: 81.5 mg/dL (ref >39.00)
LDL Cholesterol: 94 mg/dL (ref 0–99)
NonHDL: 109.07
Total CHOL/HDL Ratio: 2
Triglycerides: 75 mg/dL (ref 0.0–149.0)
VLDL: 15 mg/dL (ref 0.0–40.0)

## 2023-11-17 LAB — CBC
HCT: 43.5 % (ref 36.0–46.0)
Hemoglobin: 14.1 g/dL (ref 12.0–15.0)
MCHC: 32.4 g/dL (ref 30.0–36.0)
MCV: 94.9 fl (ref 78.0–100.0)
Platelets: 219 K/uL (ref 150.0–400.0)
RBC: 4.59 Mil/uL (ref 3.87–5.11)
RDW: 13.5 % (ref 11.5–15.5)
WBC: 5.7 K/uL (ref 4.0–10.5)

## 2023-11-17 LAB — COMPREHENSIVE METABOLIC PANEL WITH GFR
ALT: 15 U/L (ref 0–35)
AST: 19 U/L (ref 0–37)
Albumin: 3.9 g/dL (ref 3.5–5.2)
Alkaline Phosphatase: 66 U/L (ref 39–117)
BUN: 10 mg/dL (ref 6–23)
CO2: 32 meq/L (ref 19–32)
Calcium: 9.6 mg/dL (ref 8.4–10.5)
Chloride: 103 meq/L (ref 96–112)
Creatinine, Ser: 0.79 mg/dL (ref 0.40–1.20)
GFR: 75.48 mL/min (ref >60.00)
Glucose, Bld: 160 mg/dL — ABNORMAL HIGH (ref 70–99)
Potassium: 4.3 meq/L (ref 3.5–5.1)
Sodium: 140 meq/L (ref 135–145)
Total Bilirubin: 0.4 mg/dL (ref 0.2–1.2)
Total Protein: 7 g/dL (ref 6.0–8.3)

## 2023-11-17 LAB — IBC + FERRITIN
Ferritin: 23.4 ng/mL (ref 10.0–291.0)
Iron: 109 ug/dL (ref 42–145)
Saturation Ratios: 30.3 % (ref 20.0–50.0)
TIBC: 359.8 ug/dL (ref 250.0–450.0)
Transferrin: 257 mg/dL (ref 212.0–360.0)

## 2023-11-17 LAB — MICROALBUMIN / CREATININE URINE RATIO
Creatinine,U: 96.9 mg/dL
Microalb Creat Ratio: 9.7 mg/g (ref 0.0–30.0)
Microalb, Ur: 0.9 mg/dL (ref 0.0–1.9)

## 2023-11-17 LAB — TSH: TSH: 1.91 u[IU]/mL (ref 0.35–5.50)

## 2023-11-17 LAB — VITAMIN B12: Vitamin B-12: 321 pg/mL (ref 211–911)

## 2023-11-17 MED ORDER — ONDANSETRON 4 MG PO TBDP
4.0000 mg | ORAL_TABLET | Freq: Three times a day (TID) | ORAL | 0 refills | Status: AC | PRN
Start: 2023-11-17 — End: ?

## 2023-11-17 NOTE — Telephone Encounter (Signed)
 Rx resend

## 2023-11-18 ENCOUNTER — Ambulatory Visit: Payer: Self-pay | Admitting: Family Medicine

## 2023-11-18 NOTE — Progress Notes (Signed)
 Her labs are all at goal. Do not need to make any changes to her treatment plan at this time. She should continue to work on diet and exercise and we can recheck in 6-12 months.

## 2023-12-06 DIAGNOSIS — E139 Other specified diabetes mellitus without complications: Secondary | ICD-10-CM | POA: Diagnosis not present

## 2023-12-27 DIAGNOSIS — S233XXA Sprain of ligaments of thoracic spine, initial encounter: Secondary | ICD-10-CM | POA: Diagnosis not present

## 2023-12-29 DIAGNOSIS — S233XXA Sprain of ligaments of thoracic spine, initial encounter: Secondary | ICD-10-CM | POA: Diagnosis not present

## 2024-01-04 DIAGNOSIS — S233XXA Sprain of ligaments of thoracic spine, initial encounter: Secondary | ICD-10-CM | POA: Diagnosis not present

## 2024-01-18 DIAGNOSIS — S233XXA Sprain of ligaments of thoracic spine, initial encounter: Secondary | ICD-10-CM | POA: Diagnosis not present

## 2024-01-20 ENCOUNTER — Other Ambulatory Visit: Payer: Self-pay | Admitting: Family Medicine

## 2024-01-26 DIAGNOSIS — S233XXA Sprain of ligaments of thoracic spine, initial encounter: Secondary | ICD-10-CM | POA: Diagnosis not present

## 2024-02-14 ENCOUNTER — Ambulatory Visit: Payer: Medicare Other

## 2024-02-22 DIAGNOSIS — M720 Palmar fascial fibromatosis [Dupuytren]: Secondary | ICD-10-CM | POA: Diagnosis not present

## 2024-02-22 DIAGNOSIS — L821 Other seborrheic keratosis: Secondary | ICD-10-CM | POA: Diagnosis not present

## 2024-02-22 DIAGNOSIS — L738 Other specified follicular disorders: Secondary | ICD-10-CM | POA: Diagnosis not present

## 2024-02-22 DIAGNOSIS — L400 Psoriasis vulgaris: Secondary | ICD-10-CM | POA: Diagnosis not present

## 2024-03-01 DIAGNOSIS — M720 Palmar fascial fibromatosis [Dupuytren]: Secondary | ICD-10-CM | POA: Diagnosis not present

## 2024-03-01 DIAGNOSIS — M79641 Pain in right hand: Secondary | ICD-10-CM | POA: Diagnosis not present

## 2024-03-16 ENCOUNTER — Ambulatory Visit

## 2024-03-16 VITALS — BP 90/60 | HR 74 | Temp 98.1°F | Ht 64.0 in | Wt 131.6 lb

## 2024-03-16 DIAGNOSIS — Z1231 Encounter for screening mammogram for malignant neoplasm of breast: Secondary | ICD-10-CM

## 2024-03-16 DIAGNOSIS — Z0000 Encounter for general adult medical examination without abnormal findings: Secondary | ICD-10-CM | POA: Diagnosis not present

## 2024-03-16 NOTE — Patient Instructions (Signed)
 Rhonda Farley,  Thank you for taking the time for your Medicare Wellness Visit. I appreciate your continued commitment to your health goals. Please review the care plan we discussed, and feel free to reach out if I can assist you further.  Please note that Annual Wellness Visits do not include a physical exam. Some assessments may be limited, especially if the visit was conducted virtually. If needed, we may recommend an in-person follow-up with your provider.  Ongoing Care Seeing your primary care provider every 3 to 6 months helps us  monitor your health and provide consistent, personalized care.   Referrals If a referral was made during today's visit and you haven't received any updates within two weeks, please contact the referred provider directly to check on the status.  Recommended Screenings:  Health Maintenance  Topic Date Due   Hepatitis C Screening  Never done   Complete foot exam   07/01/2012   Hemoglobin A1C  11/15/2023   Eye exam for diabetics  11/18/2023   Breast Cancer Screening  02/04/2024   Flu Shot  07/11/2024*   Colon Cancer Screening  08/24/2024   Yearly kidney function blood test for diabetes  11/16/2024   Yearly kidney health urinalysis for diabetes  11/16/2024   Medicare Annual Wellness Visit  03/16/2025   DTaP/Tdap/Td vaccine (4 - Td or Tdap) 05/07/2027   Pneumococcal Vaccine for age over 51  Completed   Osteoporosis screening with Bone Density Scan  Completed   Zoster (Shingles) Vaccine  Completed   Meningitis B Vaccine  Aged Out   COVID-19 Vaccine  Discontinued  *Topic was postponed. The date shown is not the original due date.       02/09/2023   10:03 AM  Advanced Directives  Does Patient Have a Medical Advance Directive? Yes  Type of Estate Agent of [place];Living will  Copy of Healthcare Power of Attorney in Chart? No - copy requested    Vision: Annual vision screenings are recommended for early detection of glaucoma,  cataracts, and diabetic retinopathy. These exams can also reveal signs of chronic conditions such as diabetes and high blood pressure.  Dental: Annual dental screenings help detect early signs of oral cancer, gum disease, and other conditions linked to overall health, including heart disease and diabetes.  Please see the attached documents for additional preventive care recommendations.

## 2024-03-16 NOTE — Progress Notes (Addendum)
 Chief Complaint  Patient presents with   Medicare Wellness     Subjective:   Rhonda Farley is a 73 y.o. female who presents for a Medicare Annual Wellness Visit.  Visit info / Clinical Intake: Medicare Wellness Visit Type:: Subsequent Annual Wellness Visit Persons participating in visit and providing information:: patient Medicare Wellness Visit Mode:: In-person (required for WTM) Interpreter Needed?: No Pre-visit prep was completed: yes AWV questionnaire completed by patient prior to visit?: no Living arrangements:: with family/others Patient's Overall Health Status Rating: good Typical amount of pain: none Does pain affect daily life?: no Are you currently prescribed opioids?: no  Dietary Habits and Nutritional Risks How many meals a day?: 3 Eats fruit and vegetables daily?: yes Most meals are obtained by: preparing own meals In the last 2 weeks, have you had any of the following?: none Diabetic:: (!) yes Any non-healing wounds?: no How often do you check your BS?: continuous glucose monitor Would you like to be referred to a Nutritionist or for Diabetic Management? : no  Functional Status Activities of Daily Living (to include ambulation/medication): Independent Ambulation: Independent with device- listed below Home Assistive Devices/Equipment: Eyeglasses Medication Administration: Independent Home Management (perform basic housework or laundry): Independent Manage your own finances?: yes Primary transportation is: driving Concerns about vision?: no *vision screening is required for WTM* Concerns about hearing?: no  Fall Screening Falls in the past year?: 1 Number of falls in past year: 1 Was there an injury with Fall?: 0 Fall Risk Category Calculator: 2 Patient Fall Risk Level: Moderate Fall Risk  Fall Risk Patient at Risk for Falls Due to: History of fall(s) Fall risk Follow up: Falls prevention discussed  Home and Transportation Safety: All rugs have  non-skid backing?: N/A, no rugs All stairs or steps have railings?: yes Grab bars in the bathtub or shower?: yes Have non-skid surface in bathtub or shower?: yes Good home lighting?: yes Regular seat belt use?: yes Hospital stays in the last year:: no  Cognitive Assessment Difficulty concentrating, remembering, or making decisions? : no Will 6CIT or Mini Cog be Completed: yes What year is it?: 0 points What month is it?: 0 points Give patient an address phrase to remember (5 components): [address] [place]  About what time is it?: 0 points Count backwards from 20 to 1: 0 points Say the months of the year in reverse: 0 points Repeat the address phrase from earlier: 2 points 6 CIT Score: 2 points  Advance Directives (For Healthcare) Does Patient Have a Medical Advance Directive?: Yes Type of Advance Directive: Healthcare Power of Attorney Copy of Healthcare Power of Attorney in Chart?: No - copy requested  Reviewed/Updated  Reviewed/Updated: Reviewed All (Medical, Surgical, Family, Medications, Allergies, Care Teams, Patient Goals)    Allergies (verified) Citrullus vulgaris and Avocado   Current Medications (verified) Outpatient Encounter Medications as of 03/16/2024  Medication Sig   azelastine  (ASTELIN ) 0.1 % nasal spray Place 2 sprays into both nostrils 2 (two) times daily.   B Complex-C (SUPER B COMPLEX PO) Take by mouth daily.   budesonide -formoterol  (SYMBICORT ) 80-4.5 MCG/ACT inhaler Inhale 2 puffs into the lungs as needed.   buPROPion  (WELLBUTRIN  XL) 150 MG 24 hr tablet TAKE 1 TABLET BY MOUTH EVERY DAY IN THE MORNING   butalbital -acetaminophen-caffeine  (FIORICET) 50-325-40 MG tablet Take 2 tablets by mouth as needed.   CALCIUM ACETATE-MAGNESIUM  CARB PO Take by mouth. Vit d 3   cetirizine (ZYRTEC) 10 MG tablet Take 10 mg by mouth daily.  Continuous Blood Gluc Receiver (FREESTYLE LIBRE READER) DEVI Use up to 4 times daily to check blood sugar.   Continuous Glucose  Sensor (FREESTYLE LIBRE 2 SENSOR) MISC USE AS DIRECTED E11.9   cyanocobalamin  (VITAMIN B12) 1000 MCG/ML injection INJECT 1 ML INTO THE MUSCLE EVERY 30 DAYS. PT MAY SELF ADMINSTER   cyclobenzaprine  (FLEXERIL ) 10 MG tablet Take 1 tablet (10 mg total) by mouth 3 (three) times daily as needed for muscle spasms.   diclofenac  sodium (VOLTAREN ) 1 % GEL Apply 2 g topically 4 (four) times daily.   DULoxetine  (CYMBALTA ) 60 MG capsule TAKE 1 CAPSULE BY MOUTH EVERY DAY   Insulin  Pen Needle 32G X 6 MM MISC 1 each by Does not apply route daily.   Multiple Vitamin (MULTIVITAMIN) capsule Take 1 capsule by mouth daily.   Olopatadine HCl 0.2 % SOLN Apply to eye as needed.   ondansetron  (ZOFRAN  ODT) 4 MG disintegrating tablet Take 1 tablet (4 mg total) by mouth every 8 (eight) hours as needed for nausea or vomiting.   Risankizumab-rzaa (SKYRIZI) 150 MG/ML SOSY Inject 150 mg into the skin as needed.   Semaglutide ,0.25 or 0.5MG /DOS, (OZEMPIC , 0.25 OR 0.5 MG/DOSE,) 2 MG/3ML SOPN INJECT 0.5 MG INTO THE SKIN ONE TIME PER WEEK   simvastatin  (ZOCOR ) 40 MG tablet Take 1 tablet (40 mg total) by mouth every other day.   TOUJEO  SOLOSTAR 300 UNIT/ML Solostar Pen INJECT 8 UNITS ONCE DAILY.   traZODone  (DESYREL ) 100 MG tablet TAKE 1 TABLET EVERY NIGHT AT BEDTIME - GENERIC FOR DESYREL    Triamcinolone  Acetonide (TRIAMCINOLONE  0.1 % CREAM : EUCERIN) CREA Apply 1 application topically 2 (two) times daily.   valACYclovir  (VALTREX ) 500 MG tablet Take 1 tablet (500 mg total) by mouth 2 (two) times daily.   No facility-administered encounter medications on file as of 03/16/2024.    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    Chronic headaches    Depression    Diabetes mellitus    Fibromyalgia    GERD (gastroesophageal reflux disease)    Hyperlipidemia    Hypertension    no per patient   Migraines    Obesity     Pneumonia    Shingles    Tachycardia    Thyroid  disease    Past Surgical History:  Procedure Laterality Date   ABDOMINAL EXPLORATION SURGERY     BUNIONECTOMY     CERVICAL LAMINECTOMY  2000   ROTATOR CUFF REPAIR  01/2010   right   ROUX-EN-Y PROCEDURE  2009   TONSILLECTOMY     Family History  Problem Relation Age of Onset   Arthritis Mother    Cancer Mother        colon/skin   Colon cancer Mother    Cancer Father        lung/prostate   Diabetes Paternal Uncle    Colon cancer Paternal Uncle    Arthritis Paternal Grandmother    Heart disease Paternal Grandmother    Arthritis Paternal Grandfather    Cancer Paternal Grandfather        prostate   Esophageal cancer Maternal Aunt    Social History   Occupational History   Not on file  Tobacco Use   Smoking status: Former    Current packs/day: 0.00    Types: Cigarettes    Quit date: 04/13/1977  Years since quitting: 46.9   Smokeless tobacco: Never  Vaping Use   Vaping status: Never Used  Substance and Sexual Activity   Alcohol use: No   Drug use: No   Sexual activity: Not on file   Tobacco Counseling Counseling given: Not Answered  SDOH Screenings   Food Insecurity: No Food Insecurity (03/16/2024)  Housing: Unknown (03/16/2024)  Transportation Needs: No Transportation Needs (03/16/2024)  Utilities: Not At Risk (03/16/2024)  Depression (PHQ2-9): Low Risk  (03/16/2024)  Financial Resource Strain: Low Risk  (02/09/2023)  Physical Activity: Sufficiently Active (03/16/2024)  Social Connections: Moderately Integrated (03/16/2024)  Stress: No Stress Concern Present (03/16/2024)  Tobacco Use: Medium Risk (03/16/2024)  Health Literacy: Adequate Health Literacy (03/16/2024)   See flowsheets for full screening details  Depression Screen PHQ 2 & 9 Depression Scale- Over the past 2 weeks, how often have you been bothered by any of the following problems? Little interest or pleasure in doing things: 0 Feeling down, depressed,  or hopeless (PHQ Adolescent also includes...irritable): 0 PHQ-2 Total Score: 0 Trouble falling or staying asleep, or sleeping too much: 0 Feeling tired or having little energy: 0 Poor appetite or overeating (PHQ Adolescent also includes...weight loss): 0 Feeling bad about yourself - or that you are a failure or have let yourself or your family down: 0 Trouble concentrating on things, such as reading the newspaper or watching television (PHQ Adolescent also includes...like school work): 0 Moving or speaking so slowly that other people could have noticed. Or the opposite - being so fidgety or restless that you have been moving around a lot more than usual: 0 Thoughts that you would be better off dead, or of hurting yourself in some way: 0 PHQ-9 Total Score: 0 If you checked off any problems, how difficult have these problems made it for you to do your work, take care of things at home, or get along with other people?: Not difficult at all  Depression Treatment Depression Interventions/Treatment : Counseling     Goals Addressed               This Visit's Progress     maintian health and activity (pt-stated)               Objective:    Today's Vitals   03/16/24 1147  BP: 90/60  Pulse: 74  Temp: 98.1 F (36.7 C)  SpO2: 94%  Weight: 131 lb 9.6 oz (59.7 kg)  Height: 5' 4 (1.626 m)   Body mass index is 22.59 kg/m.  Hearing/Vision screen Hearing Screening - Comments:: Pt denies any hearing issues  Vision Screening - Comments:: Wears rx glasses - up to date with routine eye exams with Dr Robinson  Immunizations and Health Maintenance Health Maintenance  Topic Date Due   Hepatitis C Screening  Never done   FOOT EXAM  07/01/2012   HEMOGLOBIN A1C  11/15/2023   OPHTHALMOLOGY EXAM  11/18/2023   Mammogram  02/04/2024   Influenza Vaccine  07/11/2024 (Originally 11/12/2023)   Colonoscopy  08/24/2024   Diabetic kidney evaluation - eGFR measurement  11/16/2024   Diabetic kidney  evaluation - Urine ACR  11/16/2024   Medicare Annual Wellness (AWV)  03/16/2025   DTaP/Tdap/Td (4 - Td or Tdap) 05/07/2027   Pneumococcal Vaccine: 50+ Years  Completed   Bone Density Scan  Completed   Zoster Vaccines- Shingrix  Completed   Meningococcal B Vaccine  Aged Out   COVID-19 Vaccine  Discontinued  Assessment/Plan:  This is a routine wellness examination for [facility].  Patient Care Team: Kennyth Worth HERO, MD as PCP - General (Family Medicine)  I have personally reviewed and noted the following in the patient's chart:   Medical and social history Use of alcohol, tobacco or illicit drugs  Current medications and supplements including opioid prescriptions. Functional ability and status Nutritional status Physical activity Advanced directives List of other physicians Hospitalizations, surgeries, and ER visits in previous 12 months Vitals Screenings to include cognitive, depression, and falls Referrals and appointments  Orders Placed This Encounter  Procedures   MM Digital Screening Bilateral    Standing Status:   Future    Expiration Date:   06/14/2024    Reason for Exam (SYMPTOM  OR DIAGNOSIS REQUIRED):   Breast Cancer Screening    Preferred imaging location?:   [facility]   In addition, I have reviewed and discussed with patient certain preventive protocols, quality metrics, and best practice recommendations. A written personalized care plan for preventive services as well as general preventive health recommendations were provided to patient.   Ellouise VEAR Haws, LPN   [id number]   Return in about 1 year (around 03/21/2025).  After Visit Summary: (In Person-Printed) AVS printed and given to the patient  Nurse Notes: none

## 2024-03-23 DIAGNOSIS — L738 Other specified follicular disorders: Secondary | ICD-10-CM | POA: Diagnosis not present

## 2024-03-23 DIAGNOSIS — L308 Other specified dermatitis: Secondary | ICD-10-CM | POA: Diagnosis not present

## 2024-03-23 DIAGNOSIS — Z23 Encounter for immunization: Secondary | ICD-10-CM | POA: Diagnosis not present

## 2024-04-11 ENCOUNTER — Other Ambulatory Visit: Payer: Self-pay | Admitting: Family Medicine
